# Patient Record
Sex: Male | Born: 1935 | Race: Black or African American | Hispanic: No | Marital: Married | Smoking: Former smoker
Health system: Southern US, Community
[De-identification: ages and names within clinical notes are randomized; demographics above are authoritative.]

## PROBLEM LIST (undated history)

## (undated) DIAGNOSIS — E785 Hyperlipidemia, unspecified: Secondary | ICD-10-CM

## (undated) DIAGNOSIS — F0280 Dementia in other diseases classified elsewhere without behavioral disturbance: Secondary | ICD-10-CM

## (undated) DIAGNOSIS — D759 Disease of blood and blood-forming organs, unspecified: Secondary | ICD-10-CM

## (undated) DIAGNOSIS — G309 Alzheimer's disease, unspecified: Secondary | ICD-10-CM

## (undated) DIAGNOSIS — K5792 Diverticulitis of intestine, part unspecified, without perforation or abscess without bleeding: Secondary | ICD-10-CM

## (undated) DIAGNOSIS — E039 Hypothyroidism, unspecified: Secondary | ICD-10-CM

## (undated) DIAGNOSIS — N529 Male erectile dysfunction, unspecified: Secondary | ICD-10-CM

## (undated) DIAGNOSIS — I639 Cerebral infarction, unspecified: Secondary | ICD-10-CM

## (undated) DIAGNOSIS — N400 Enlarged prostate without lower urinary tract symptoms: Secondary | ICD-10-CM

## (undated) DIAGNOSIS — M1990 Unspecified osteoarthritis, unspecified site: Secondary | ICD-10-CM

## (undated) DIAGNOSIS — I10 Essential (primary) hypertension: Secondary | ICD-10-CM

## (undated) DIAGNOSIS — E038 Other specified hypothyroidism: Secondary | ICD-10-CM

## (undated) HISTORY — DX: Hypothyroidism, unspecified: E03.9

## (undated) HISTORY — DX: Benign prostatic hyperplasia without lower urinary tract symptoms: N40.0

## (undated) HISTORY — DX: Hyperlipidemia, unspecified: E78.5

## (undated) HISTORY — DX: Diverticulitis of intestine, part unspecified, without perforation or abscess without bleeding: K57.92

## (undated) HISTORY — PX: INGUINAL HERNIA REPAIR: SUR1180

## (undated) HISTORY — PX: OTHER SURGICAL HISTORY: SHX169

## (undated) HISTORY — DX: Essential (primary) hypertension: I10

## (undated) HISTORY — DX: Other specified hypothyroidism: E03.8

## (undated) HISTORY — PX: COLECTOMY: SHX59

## (undated) HISTORY — DX: Male erectile dysfunction, unspecified: N52.9

---

## 1998-05-14 ENCOUNTER — Ambulatory Visit (HOSPITAL_COMMUNITY): Admission: RE | Admit: 1998-05-14 | Discharge: 1998-05-14 | Payer: Self-pay | Admitting: *Deleted

## 2002-05-16 ENCOUNTER — Encounter: Payer: Self-pay | Admitting: Family Medicine

## 2002-05-16 ENCOUNTER — Encounter: Admission: RE | Admit: 2002-05-16 | Discharge: 2002-05-16 | Payer: Self-pay | Admitting: Family Medicine

## 2002-05-20 ENCOUNTER — Encounter (HOSPITAL_BASED_OUTPATIENT_CLINIC_OR_DEPARTMENT_OTHER): Payer: Self-pay | Admitting: General Surgery

## 2002-05-24 ENCOUNTER — Inpatient Hospital Stay (HOSPITAL_COMMUNITY): Admission: RE | Admit: 2002-05-24 | Discharge: 2002-06-08 | Payer: Self-pay | Admitting: General Surgery

## 2002-05-29 ENCOUNTER — Encounter (HOSPITAL_BASED_OUTPATIENT_CLINIC_OR_DEPARTMENT_OTHER): Payer: Self-pay | Admitting: General Surgery

## 2002-05-31 ENCOUNTER — Encounter (HOSPITAL_BASED_OUTPATIENT_CLINIC_OR_DEPARTMENT_OTHER): Payer: Self-pay | Admitting: General Surgery

## 2003-07-26 ENCOUNTER — Ambulatory Visit (HOSPITAL_COMMUNITY): Admission: RE | Admit: 2003-07-26 | Discharge: 2003-07-26 | Payer: Self-pay | Admitting: *Deleted

## 2006-06-04 ENCOUNTER — Ambulatory Visit: Payer: Self-pay | Admitting: Family Medicine

## 2006-06-04 LAB — CONVERTED CEMR LAB
ALT: 21 units/L (ref 0–40)
AST: 22 units/L (ref 0–37)
BUN: 15 mg/dL (ref 6–23)
CO2: 28 meq/L (ref 19–32)
Calcium: 9.5 mg/dL (ref 8.4–10.5)
Chloride: 95 meq/L — ABNORMAL LOW (ref 96–112)
Chol/HDL Ratio, serum: 3.1
Cholesterol: 120 mg/dL (ref 0–200)
Creatinine, Ser: 1.3 mg/dL (ref 0.4–1.5)
Creatinine,U: 52.7 mg/dL
GFR calc non Af Amer: 58 mL/min
Glomerular Filtration Rate, Af Am: 70 mL/min/{1.73_m2}
Glucose, Bld: 131 mg/dL — ABNORMAL HIGH (ref 70–99)
HDL: 38.2 mg/dL — ABNORMAL LOW (ref >39.0)
Hgb A1c MFr Bld: 7.1 % — ABNORMAL HIGH (ref 4.6–6.0)
LDL Cholesterol: 70 mg/dL (ref 0–99)
Microalb Creat Ratio: 15.2 mg/g (ref 0.0–30.0)
Microalb, Ur: 0.8 mg/dL (ref 0.0–1.9)
Potassium: 4.4 meq/L (ref 3.5–5.1)
Sodium: 133 meq/L — ABNORMAL LOW (ref 135–145)
Triglyceride fasting, serum: 58 mg/dL (ref 0–149)
VLDL: 12 mg/dL (ref 0–40)

## 2006-09-08 ENCOUNTER — Ambulatory Visit: Payer: Self-pay | Admitting: Family Medicine

## 2006-10-27 ENCOUNTER — Ambulatory Visit: Payer: Self-pay | Admitting: Family Medicine

## 2006-10-27 LAB — CONVERTED CEMR LAB
BUN: 16 mg/dL (ref 6–23)
CO2: 31 meq/L (ref 19–32)
Calcium: 9.1 mg/dL (ref 8.4–10.5)
Creatinine, Ser: 1.2 mg/dL (ref 0.4–1.5)
Glucose, Bld: 130 mg/dL — ABNORMAL HIGH (ref 70–99)

## 2006-11-27 ENCOUNTER — Ambulatory Visit: Payer: Self-pay | Admitting: Family Medicine

## 2006-11-27 LAB — CONVERTED CEMR LAB
CO2: 31 meq/L (ref 19–32)
Calcium: 9.3 mg/dL (ref 8.4–10.5)
Chloride: 105 meq/L (ref 96–112)
Creatinine, Ser: 1.1 mg/dL (ref 0.4–1.5)
Glucose, Bld: 85 mg/dL (ref 70–99)
Sodium: 142 meq/L (ref 135–145)

## 2006-12-15 DIAGNOSIS — Z8719 Personal history of other diseases of the digestive system: Secondary | ICD-10-CM

## 2006-12-15 DIAGNOSIS — I10 Essential (primary) hypertension: Secondary | ICD-10-CM | POA: Insufficient documentation

## 2006-12-15 DIAGNOSIS — E785 Hyperlipidemia, unspecified: Secondary | ICD-10-CM

## 2007-01-25 ENCOUNTER — Ambulatory Visit: Payer: Self-pay | Admitting: Family Medicine

## 2007-01-25 DIAGNOSIS — F528 Other sexual dysfunction not due to a substance or known physiological condition: Secondary | ICD-10-CM | POA: Insufficient documentation

## 2007-01-28 ENCOUNTER — Ambulatory Visit: Payer: Self-pay | Admitting: Family Medicine

## 2007-01-28 LAB — CONVERTED CEMR LAB
AST: 19 units/L (ref 0–37)
BUN: 13 mg/dL (ref 6–23)
CO2: 29 meq/L (ref 19–32)
Calcium: 9 mg/dL (ref 8.4–10.5)
Creatinine,U: 77.3 mg/dL
GFR calc Af Amer: 94 mL/min
GFR calc non Af Amer: 78 mL/min
LDL Cholesterol: 64 mg/dL (ref 0–99)
Microalb, Ur: 1.1 mg/dL (ref 0.0–1.9)
Potassium: 4.5 meq/L (ref 3.5–5.1)
Total CHOL/HDL Ratio: 3.4
Triglycerides: 55 mg/dL (ref 0–149)
VLDL: 11 mg/dL (ref 0–40)

## 2007-01-29 ENCOUNTER — Telehealth (INDEPENDENT_AMBULATORY_CARE_PROVIDER_SITE_OTHER): Payer: Self-pay | Admitting: *Deleted

## 2007-03-12 ENCOUNTER — Ambulatory Visit: Payer: Self-pay | Admitting: Family Medicine

## 2007-04-09 ENCOUNTER — Telehealth (INDEPENDENT_AMBULATORY_CARE_PROVIDER_SITE_OTHER): Payer: Self-pay | Admitting: Family Medicine

## 2007-04-10 ENCOUNTER — Ambulatory Visit: Payer: Self-pay | Admitting: Family Medicine

## 2007-04-10 ENCOUNTER — Encounter (INDEPENDENT_AMBULATORY_CARE_PROVIDER_SITE_OTHER): Payer: Self-pay | Admitting: Family Medicine

## 2007-04-15 ENCOUNTER — Telehealth (INDEPENDENT_AMBULATORY_CARE_PROVIDER_SITE_OTHER): Payer: Self-pay | Admitting: *Deleted

## 2007-04-19 ENCOUNTER — Encounter (INDEPENDENT_AMBULATORY_CARE_PROVIDER_SITE_OTHER): Payer: Self-pay | Admitting: Family Medicine

## 2007-04-23 ENCOUNTER — Ambulatory Visit: Payer: Self-pay | Admitting: Family Medicine

## 2007-04-23 ENCOUNTER — Telehealth (INDEPENDENT_AMBULATORY_CARE_PROVIDER_SITE_OTHER): Payer: Self-pay | Admitting: *Deleted

## 2007-07-22 ENCOUNTER — Ambulatory Visit: Payer: Self-pay | Admitting: Family Medicine

## 2007-07-22 LAB — CONVERTED CEMR LAB
Chloride: 103 meq/L (ref 96–112)
Cholesterol: 126 mg/dL (ref 0–200)
Creatinine, Ser: 1.2 mg/dL (ref 0.4–1.5)
GFR calc non Af Amer: 63 mL/min
Glucose, Bld: 125 mg/dL — ABNORMAL HIGH (ref 70–99)
HDL: 40.7 mg/dL (ref >39.0)
LDL Cholesterol: 78 mg/dL (ref 0–99)
PSA: 1.36 ng/mL (ref 0.10–4.00)
Potassium: 5.3 meq/L — ABNORMAL HIGH (ref 3.5–5.1)
Sodium: 141 meq/L (ref 135–145)
Triglycerides: 39 mg/dL (ref 0–149)

## 2007-07-23 ENCOUNTER — Telehealth (INDEPENDENT_AMBULATORY_CARE_PROVIDER_SITE_OTHER): Payer: Self-pay | Admitting: *Deleted

## 2007-07-28 ENCOUNTER — Telehealth (INDEPENDENT_AMBULATORY_CARE_PROVIDER_SITE_OTHER): Payer: Self-pay | Admitting: Family Medicine

## 2007-07-30 ENCOUNTER — Ambulatory Visit: Payer: Self-pay | Admitting: Family Medicine

## 2007-07-30 LAB — CONVERTED CEMR LAB: Potassium: 4.8 meq/L (ref 3.5–5.1)

## 2007-08-02 ENCOUNTER — Encounter (INDEPENDENT_AMBULATORY_CARE_PROVIDER_SITE_OTHER): Payer: Self-pay | Admitting: *Deleted

## 2007-11-18 ENCOUNTER — Ambulatory Visit: Payer: Self-pay | Admitting: Internal Medicine

## 2007-11-25 ENCOUNTER — Encounter (INDEPENDENT_AMBULATORY_CARE_PROVIDER_SITE_OTHER): Payer: Self-pay | Admitting: *Deleted

## 2007-12-10 ENCOUNTER — Encounter: Payer: Self-pay | Admitting: Internal Medicine

## 2008-01-31 ENCOUNTER — Telehealth (INDEPENDENT_AMBULATORY_CARE_PROVIDER_SITE_OTHER): Payer: Self-pay | Admitting: *Deleted

## 2008-04-27 ENCOUNTER — Ambulatory Visit: Payer: Self-pay | Admitting: Internal Medicine

## 2008-05-02 ENCOUNTER — Encounter (INDEPENDENT_AMBULATORY_CARE_PROVIDER_SITE_OTHER): Payer: Self-pay | Admitting: *Deleted

## 2008-05-03 ENCOUNTER — Encounter (INDEPENDENT_AMBULATORY_CARE_PROVIDER_SITE_OTHER): Payer: Self-pay | Admitting: *Deleted

## 2008-05-03 LAB — CONVERTED CEMR LAB
ALT: 17 units/L (ref 0–53)
AST: 21 units/L (ref 0–37)
BUN: 22 mg/dL (ref 6–23)
Basophils Relative: 0.1 % (ref 0.0–3.0)
CO2: 31 meq/L (ref 19–32)
Calcium: 9.5 mg/dL (ref 8.4–10.5)
Creatinine, Ser: 1.4 mg/dL (ref 0.4–1.5)
Glucose, Bld: 126 mg/dL — ABNORMAL HIGH (ref 70–99)
Hemoglobin: 15 g/dL (ref 13.0–17.0)
Lymphocytes Relative: 37.7 % (ref 12.0–46.0)
Monocytes Relative: 8.1 % (ref 3.0–12.0)
Neutro Abs: 3 10*3/uL (ref 1.4–7.7)
RBC: 4.78 M/uL (ref 4.22–5.81)

## 2008-05-11 ENCOUNTER — Ambulatory Visit: Payer: Self-pay | Admitting: Internal Medicine

## 2008-05-11 LAB — CONVERTED CEMR LAB
Bilirubin Urine: NEGATIVE
Glucose, Urine, Semiquant: NEGATIVE
Ketones, urine, test strip: NEGATIVE
Nitrite: NEGATIVE
pH: 5

## 2008-05-12 ENCOUNTER — Telehealth: Payer: Self-pay | Admitting: Internal Medicine

## 2008-05-12 ENCOUNTER — Encounter: Payer: Self-pay | Admitting: Internal Medicine

## 2008-05-22 ENCOUNTER — Telehealth (INDEPENDENT_AMBULATORY_CARE_PROVIDER_SITE_OTHER): Payer: Self-pay | Admitting: *Deleted

## 2008-06-19 ENCOUNTER — Telehealth (INDEPENDENT_AMBULATORY_CARE_PROVIDER_SITE_OTHER): Payer: Self-pay | Admitting: *Deleted

## 2008-07-18 ENCOUNTER — Telehealth (INDEPENDENT_AMBULATORY_CARE_PROVIDER_SITE_OTHER): Payer: Self-pay | Admitting: *Deleted

## 2008-08-30 ENCOUNTER — Ambulatory Visit: Payer: Self-pay | Admitting: Internal Medicine

## 2008-08-30 DIAGNOSIS — H409 Unspecified glaucoma: Secondary | ICD-10-CM | POA: Insufficient documentation

## 2008-08-30 DIAGNOSIS — D696 Thrombocytopenia, unspecified: Secondary | ICD-10-CM

## 2008-08-31 ENCOUNTER — Telehealth (INDEPENDENT_AMBULATORY_CARE_PROVIDER_SITE_OTHER): Payer: Self-pay | Admitting: *Deleted

## 2008-09-05 LAB — CONVERTED CEMR LAB
Basophils Absolute: 0 10*3/uL (ref 0.0–0.1)
Basophils Relative: 0.2 % (ref 0.0–3.0)
Calcium: 9.5 mg/dL (ref 8.4–10.5)
Chloride: 103 meq/L (ref 96–112)
Cholesterol: 128 mg/dL (ref 0–200)
Creatinine, Ser: 1 mg/dL (ref 0.4–1.5)
Eosinophils Absolute: 0.2 10*3/uL (ref 0.0–0.7)
GFR calc Af Amer: 94 mL/min
GFR calc non Af Amer: 77 mL/min
HDL: 43.2 mg/dL (ref >39.0)
Hgb A1c MFr Bld: 6.6 % — ABNORMAL HIGH (ref 4.6–6.0)
MCHC: 34 g/dL (ref 30.0–36.0)
MCV: 91.6 fL (ref 78.0–100.0)
Neutrophils Relative %: 55.8 % (ref 43.0–77.0)
RBC: 4.82 M/uL (ref 4.22–5.81)
VLDL: 10 mg/dL (ref 0–40)

## 2008-11-06 ENCOUNTER — Encounter: Payer: Self-pay | Admitting: Internal Medicine

## 2008-11-14 ENCOUNTER — Encounter: Payer: Self-pay | Admitting: Internal Medicine

## 2008-11-14 LAB — HM COLONOSCOPY

## 2008-12-12 ENCOUNTER — Encounter: Payer: Self-pay | Admitting: Internal Medicine

## 2009-01-15 ENCOUNTER — Ambulatory Visit: Payer: Self-pay | Admitting: Internal Medicine

## 2009-05-21 ENCOUNTER — Ambulatory Visit: Payer: Self-pay | Admitting: Internal Medicine

## 2009-05-24 ENCOUNTER — Telehealth (INDEPENDENT_AMBULATORY_CARE_PROVIDER_SITE_OTHER): Payer: Self-pay | Admitting: *Deleted

## 2009-05-25 ENCOUNTER — Ambulatory Visit: Payer: Self-pay | Admitting: Diagnostic Radiology

## 2009-05-25 ENCOUNTER — Ambulatory Visit (HOSPITAL_BASED_OUTPATIENT_CLINIC_OR_DEPARTMENT_OTHER): Admission: RE | Admit: 2009-05-25 | Discharge: 2009-05-25 | Payer: Self-pay | Admitting: Internal Medicine

## 2009-05-25 ENCOUNTER — Ambulatory Visit: Payer: Self-pay | Admitting: Family

## 2009-05-25 DIAGNOSIS — E119 Type 2 diabetes mellitus without complications: Secondary | ICD-10-CM

## 2009-05-28 ENCOUNTER — Encounter: Payer: Self-pay | Admitting: Family

## 2009-06-08 ENCOUNTER — Ambulatory Visit: Payer: Self-pay | Admitting: Internal Medicine

## 2009-06-14 ENCOUNTER — Encounter (INDEPENDENT_AMBULATORY_CARE_PROVIDER_SITE_OTHER): Payer: Self-pay | Admitting: *Deleted

## 2009-06-14 LAB — CONVERTED CEMR LAB
BUN: 15 mg/dL (ref 6–23)
CO2: 31 meq/L (ref 19–32)
Chloride: 101 meq/L (ref 96–112)
Creatinine, Ser: 1.3 mg/dL (ref 0.4–1.5)
Glucose, Bld: 91 mg/dL (ref 70–99)
Microalb Creat Ratio: 13.6 mg/g (ref 0.0–30.0)

## 2009-07-20 ENCOUNTER — Encounter: Payer: Self-pay | Admitting: Internal Medicine

## 2009-08-21 ENCOUNTER — Ambulatory Visit: Payer: Self-pay | Admitting: Internal Medicine

## 2009-08-21 DIAGNOSIS — M549 Dorsalgia, unspecified: Secondary | ICD-10-CM | POA: Insufficient documentation

## 2009-08-21 LAB — CONVERTED CEMR LAB: Blood Glucose, Fingerstick: 179

## 2009-08-30 ENCOUNTER — Telehealth: Payer: Self-pay | Admitting: Internal Medicine

## 2009-09-03 ENCOUNTER — Encounter: Payer: Self-pay | Admitting: Internal Medicine

## 2009-09-04 ENCOUNTER — Encounter: Admission: RE | Admit: 2009-09-04 | Discharge: 2009-09-04 | Payer: Self-pay | Admitting: Internal Medicine

## 2009-11-16 ENCOUNTER — Ambulatory Visit: Payer: Self-pay | Admitting: Internal Medicine

## 2009-11-22 ENCOUNTER — Ambulatory Visit: Payer: Self-pay | Admitting: Internal Medicine

## 2009-11-25 LAB — CONVERTED CEMR LAB
AST: 22 units/L (ref 0–37)
Basophils Relative: 0.5 % (ref 0.0–3.0)
Cholesterol: 126 mg/dL (ref 0–200)
Eosinophils Relative: 4.2 % (ref 0.0–5.0)
HCT: 43 % (ref 39.0–52.0)
Lymphs Abs: 2.1 10*3/uL (ref 0.7–4.0)
MCV: 92.3 fL (ref 78.0–100.0)
Monocytes Absolute: 0.5 10*3/uL (ref 0.1–1.0)
Monocytes Relative: 9.9 % (ref 3.0–12.0)
Neutrophils Relative %: 47.6 % (ref 43.0–77.0)
RBC: 4.65 M/uL (ref 4.22–5.81)
Triglycerides: 27 mg/dL (ref 0.0–149.0)
WBC: 5.4 10*3/uL (ref 4.5–10.5)

## 2010-03-15 ENCOUNTER — Telehealth: Payer: Self-pay | Admitting: Internal Medicine

## 2010-05-09 ENCOUNTER — Ambulatory Visit: Payer: Self-pay | Admitting: Internal Medicine

## 2010-05-14 ENCOUNTER — Ambulatory Visit: Payer: Self-pay | Admitting: Internal Medicine

## 2010-05-16 LAB — CONVERTED CEMR LAB
BUN: 19 mg/dL (ref 6–23)
CO2: 31 meq/L (ref 19–32)
GFR calc non Af Amer: 94.12 mL/min (ref >60)
Glucose, Bld: 135 mg/dL — ABNORMAL HIGH (ref 70–99)
Potassium: 5.4 meq/L — ABNORMAL HIGH (ref 3.5–5.1)

## 2010-05-29 ENCOUNTER — Ambulatory Visit: Payer: Self-pay | Admitting: Internal Medicine

## 2010-06-20 ENCOUNTER — Encounter: Payer: Self-pay | Admitting: Internal Medicine

## 2010-08-06 NOTE — Assessment & Plan Note (Signed)
Summary: FLU SHOT/KN   Nurse Visit   Allergies: No Known Drug Allergies  Orders Added: 1)  Flu Vaccine 3yrs + MEDICARE PATIENTS [Q2039] 2)  Administration Flu vaccine - MCR [G0008] Flu Vaccine Consent Questions     Do you have a history of severe allergic reactions to this vaccine? no    Any prior history of allergic reactions to egg and/or gelatin? no    Do you have a sensitivity to the preservative Thimersol? no    Do you have a past history of Guillan-Barre Syndrome? no    Do you currently have an acute febrile illness? no    Have you ever had a severe reaction to latex? no    Vaccine information given and explained to patient? yes    Are you currently pregnant? no    Lot Number:[id number]   Exp Date:01/04/2011   Site Given  Left Deltoid IM 

## 2010-08-06 NOTE — Miscellaneous (Signed)
Summary: Orders Update  Clinical Lists Changes  Orders: Added new Referral order of Orthopedic Referral (Ortho) - Signed 

## 2010-08-06 NOTE — Letter (Signed)
Summary: CMN for Therapeutic Shoes/[facility] Diabetic Supplies  CMN for Therapeutic Shoes/[facility] Diabetic Supplies   Imported By: Lanelle Bal 07/25/2009 13:01:02  _____________________________________________________________________  External Attachment:    Type:   Image     Comment:   External Document

## 2010-08-06 NOTE — Letter (Signed)
Summary: CMN for Diabetes Supplies/[facility] Diabetic Supplies  CMN for Diabetes Supplies/[facility] Diabetic Supplies   Imported By: Lanelle Bal 07/25/2009 13:00:04  _____________________________________________________________________  External Attachment:    Type:   Image     Comment:   External Document

## 2010-08-06 NOTE — Progress Notes (Signed)
Summary: VERAPAMIL REFILL  Phone Note Refill Request Message from:  Fax from Pharmacy on March 15, 2010 4:45 PM  Refills Requested: Medication #1:  VERAPAMIL HCL CR 240 MG TBCR 1 by mouth two times a day [facility], [address]--HAND WRITTEN NOTE ON FAX STATES--" NO REFILLS (HERE) ON FILE--LAST RX 11/2009 WITH 3 REFILLS--NEED NEW RX. Christian Erickson - [phone]  Initial call taken by: Jerolyn Shin,  March 15, 2010 4:47 PM  Follow-up for Phone Call        I see that this was sent, but will give one refill, patient coming due for office visit anyway. Follow-up by: Lucious Groves CMA,  March 15, 2010 5:23 PM    Prescriptions: VERAPAMIL HCL CR 240 MG TBCR (VERAPAMIL HCL) 1 by mouth two times a day  #60 Tablet x 0   Entered by:   Lucious Groves CMA   Authorized by:   Nolon Rod. Matthew Pais MD   Signed by:   Lucious Groves CMA on 03/15/2010   Method used:   Electronically to        [facility] [phone]* (retail)       [address]       [place], [place]  [address]       Ph: [phone]       Fax: [phone]   RxID:   [id number]

## 2010-08-06 NOTE — Assessment & Plan Note (Signed)
Summary: rto 4-5 months/cbs   Vital Signs:  Patient profile:   77 year old male Height:      66.5 inches Weight:      168.38 pounds BMI:     26.87 Pulse rate:   78 / minute Pulse rhythm:   regular BP sitting:   130 / 80  (left arm) Cuff size:   regular  Vitals Entered By: Army Fossa CMA (May 14, 2010 11:26 AM) CC: 4-5 month f/u- not fasting Comments [facility] [address]    History of Present Illness: ROV   Diabetes-- ambulatory CBGs in the 90s, occasionally in the 68s (w/o hypoglycemic symptoms) Hypertension-- normal ambulatory BPs     ROS + stress d/t family issues  (-)CP (-)SOB had back pain, resolved    Current Medications (verified): 1)  Enalapril Maleate 20 Mg  Tabs (Enalapril Maleate) .... Take One Tablet Twice Daily. 2)  Verapamil Hcl Cr 240 Mg Tbcr (Verapamil Hcl) .[name].. 1 By Mouth Two Times A Day 3)  Glucophage 1000 Mg  Tabs (Metformin Hcl) .[name].. 1 By Mouth Bid 4)  Lipitor 20 Mg Tabs (Atorvastatin Calcium) .... Once Daily 5)  Levitra 10 Mg Tabs (Vardenafil Hcl) .... Use As Directed As Needed 6)  Aspirin 81 Mg Tabs (Aspirin) 7)  Multivitamin 8)  Antioxident 9)  Lumigan 0.03 % Soln (Bimatoprost)  Allergies (verified): No Known Drug Allergies  Past History:  Past Medical History: Reviewed history from 06/08/2009 and no changes required. BPH Diabetes Hypertension hyperlipidemia Diverticulitis, hx of ED Glaucoma , sees eye doctor routinely   Past Surgical History: Reviewed history from 08/30/2008 and no changes required. Inguinal herniorrhaphy Colectomy with reversal due to diverticulitis per patient (in 1990s aprox) SBO repair  Social History: Reviewed history from 06/08/2009 and no changes required. Retired Married wife had a CABG 2011 G-daughter dx w/ DM (45 months old) diet-- balance, low fat exercises x4/week, statonary bike, yard work  Physical Exam  General:  alert and well-developed.   Lungs:  Normal respiratory effort,  chest expands symmetrically. Lungs are clear to auscultation, no crackles or wheezes. Heart:  Normal rate and regular rhythm. S1 and S2 normal without gallop, murmur, click, rub or other extra sounds. Extremities:  no lower extremity edema     Impression & Recommendations:  Problem # 1:  HYPERTENSION (ICD-401.9) at goal  His updated medication list for this problem includes:    Enalapril Maleate 20 Mg Tabs (Enalapril maleate) .[name]... Take one tablet twice daily.    Verapamil Hcl Cr 240 Mg Tbcr (Verapamil hcl) .[name]... 1 by mouth two times a day  Orders: Venipuncture (57846) TLB-BMP (Basic Metabolic Panel-BMET) (80048-METABOL)  BP today: 130/80 Prior BP: 132/80 (11/16/2009)  Prior 10 Yr Risk Heart Disease: 27 % (11/18/2007)  Labs Reviewed: K+: 4.6 (06/08/2009) Creat: : 1.3 (06/08/2009)   Chol: 126 (11/22/2009)   HDL: 48.40 (11/22/2009)   LDL: 72 (11/22/2009)   TG: 27.0 (11/22/2009)  Problem # 2:  DM (ICD-250.00) labs His updated medication list for this problem includes:    Enalapril Maleate 20 Mg Tabs (Enalapril maleate) .[name]... Take one tablet twice daily.    Glucophage 1000 Mg Tabs (Metformin hcl) .[name]... 1 by mouth bid    Aspirin 81 Mg Tabs (Aspirin)  Orders: TLB-A1C / Hgb A1C (Glycohemoglobin) (83036-A1C)  Complete Medication List: 1)  Enalapril Maleate 20 Mg Tabs (Enalapril maleate) .... Take one tablet twice daily. 2)  Verapamil Hcl Cr 240 Mg Tbcr (Verapamil hcl) .[name].. 1 by mouth two  times a day 3)  Glucophage 1000 Mg Tabs (Metformin hcl) .[name].. 1 by mouth bid 4)  Lipitor 20 Mg Tabs (Atorvastatin calcium) .... Once daily 5)  Levitra 10 Mg Tabs (Vardenafil hcl) .... Use as directed as needed 6)  Aspirin 81 Mg Tabs (Aspirin) 7)  Multivitamin  8)  Antioxident  9)  Lumigan 0.03 % Soln (Bimatoprost)  Patient Instructions: 1)  Please schedule a follow-up appointment in 4 months, fasting , physical exam    Orders Added: 1)  Venipuncture [36415] 2)  TLB-BMP (Basic Metabolic  Panel-BMET) [80048-METABOL] 3)  TLB-A1C / Hgb A1C (Glycohemoglobin) [83036-A1C] 4)  Est. Patient Level III [10932]

## 2010-08-06 NOTE — Progress Notes (Signed)
Summary: PAIN IN BACK NO BETTER--CALLED --WHAT IS NEXT STEP?  Phone Note Call from Patient Call back at Home Phone [phone]   Caller: Patient Call For: [place] E. Delmar Dondero MD Reason for Call: Talk to Nurse Summary of Call: PATIENT CALLING, STATES THE PAIN IN HIS LOWER BACK IS NO BETTER, AND HAS MOVED DOWN LEFT LEG.  PATIENT STATES IF HE HAD NO IMPROVEMENT HE WAS TO LET DR. Teneisha Gignac KNOW.   Initial call taken by: Magdalen Spatz [facility],  August 30, 2009 3:03 PM  Follow-up for Phone Call        spoke with pt who say pain has moved down to the thigh area, subsides for a while then comes back. pt says has 3 vicodin left. Pt says pain is not any better.  Pt aware Dr Drue Novel out of office and will address in AM  Kandice Hams  August 30, 2009 3:12 PM   Follow-up by: Kandice Hams,  August 30, 2009 3:08 PM  Additional Follow-up for Phone Call Additional follow up Details #1::        NOW ONLY HAS TWO VICODIN LEFT---WOULD LIKE DR Jonell Krontz TO CALL HIM BACK OR ADVISE HIM WHAT IS HIS NEXT STEP----MRI OR ANOTHER VISIT OR SPECIALIST??? Additional Follow-up by: Jerolyn Shin,  August 31, 2009 10:40 AM    Additional Follow-up for Phone Call Additional follow up Details #2::    call #[phone] more vicodin schedule a back MRI refer to ortho Erasmus Bistline E. Delayza Lungren MD  August 31, 2009 2:03 PM   PT INFORMED RX FAXED TO [facility] --WILL BE CONTACTED BY REFERRAL COORDINATOR IN REF TO MRI OF BACK AND ORTHO   --PT SAYS ANY DAY WILL BE FINE WOULD LIKE AS SOON AS POSSIBLE Follow-up by: Kandice Hams,  August 31, 2009 3:02 PM  Additional Follow-up for Phone Call Additional follow up Details #3:: Details for Additional Follow-up Action Taken: PT IS SCHEDULED FOR HIS MRI ON 09-03-2009, AND HAS BEEN REFERRED TO ORTHOPAEDIC, PT IS AWARE Magdalen Spatz [facility]  September 03, 2009 2:51 PM   Prescriptions: VICODIN 5-500 MG TABS (HYDROCODONE-ACETAMINOPHEN) one or two by mouth every 6 hours as needed for pain  #60 x 0   Entered by:   Kandice Hams  Authorized by:   Nolon Rod. Tracyann Duffell MD   Signed by:   Kandice Hams on 08/31/2009   Method used:   Printed then faxed to ...       [facility]  [address] [phone]* (retail)       [address]       [place], [place]  [address]       Ph: [phone]       Fax: [phone]   RxID:   [id number]

## 2010-08-06 NOTE — Assessment & Plan Note (Signed)
Summary: pain in left leg/kdc   Vital Signs:  Patient profile:   77 year old male Height:      66.5 inches Weight:      162 pounds Pulse rate:   64 / minute BP sitting:   162 / 80  Vitals Entered By: rachel peeler CC: left leg pain CBG Result 179 Comments left leg, hip & lower back pain x couple days, trouble walking, "can't function", polyuria (4 x daily) from laying down all day, sharp shooting pain from back-down leg, no appetite, has not taken any meds today   History of Present Illness: two days ago, developed a left inner thigh anesthesia yesterday she developed acutely as severe pain in the left hip, left back area pain is radiated into the left leg, above  the knee pain is worse after walking and described as sharp  also states that has polyuria (4 x daily) 'from laying down all day"  no appetite, has not taken any meds today  Allergies: No Known Drug Allergies  Past History:  Past Medical History: Reviewed history from 06/08/2009 and no changes required. BPH Diabetes Hypertension hyperlipidemia Diverticulitis, hx of ED Glaucoma , sees eye doctor routinely   Past Surgical History: Reviewed history from 08/30/2008 and no changes required. Inguinal herniorrhaphy Colectomy with reversal due to diverticulitis per patient (in 1990s aprox) SBO repair  Social History: Reviewed history from 06/08/2009 and no changes required. Retired Married diet-- balance, low fat exercises x4/week, statonary bike, yard work  Review of Systems       denies any rash no fever no bladder or bowel incontinence no injury or recent fall  Physical Exam  General:  alert, well-developed, and well-nourished.  alert, well-developed, and well-nourished.   Abdomen:  soft and non-tender.  soft and non-tender.   Extremities:  no edema Neurologic:  alert & oriented X3.   lower extremity reflexes and strands symmetric gait and posture is antalgic particularly when he lays down in  the table to be examined straight leg is negative Skin:  back, abdomen, legs and buttocks are carefully inspected, no rash Psych:  not anxious appearing and not depressed appearing.     Impression & Recommendations:  Problem # 1:  BACK PAIN (ICD-724.5) Assessment New acute onset of back pain with radicular features conservative treatment for few  days but will quickly request for MRI if he is not better His updated medication list for this problem includes:    Aspirin 81 Mg Tabs (Aspirin)    Vicodin 5-500 Mg Tabs (Hydrocodone-acetaminophen) ..... One or two by mouth every 6 hours as needed for pain  Problem # 2:  DM (ICD-250.00) complain  of polyuria, CBG today 179 recommend to go back to his medicines His updated medication list for this problem includes:    Enalapril Maleate 20 Mg Tabs (Enalapril maleate) .[name]... Take one tablet twice daily.    Glucophage 1000 Mg Tabs (Metformin hcl) .[name]... 1 by mouth bid    Aspirin 81 Mg Tabs (Aspirin)  Complete Medication List: 1)  Enalapril Maleate 20 Mg Tabs (Enalapril maleate) .... Take one tablet twice daily. 2)  Verapamil Hcl Cr 240 Mg Tbcr (Verapamil hcl) .[name].. 1 by mouth two times a day 3)  Glucophage 1000 Mg Tabs (Metformin hcl) .[name].. 1 by mouth bid 4)  Lipitor 20 Mg Tabs (Atorvastatin calcium) .... Once daily 5)  Levitra 10 Mg Tabs (Vardenafil hcl) .... Use as directed as needed 6)  Aspirin 81 Mg Tabs (Aspirin) 7)  Multivitamin  8)  Antioxident  9)  Lumigan 0.03 % Soln (Bimatoprost) 10)  Vicodin 5-500 Mg Tabs (Hydrocodone-acetaminophen) .... One or two by mouth every 6 hours as needed for pain  Other Orders: Capillary Blood Glucose/CBG (91478) Capillary Blood Glucose/CBG (29562)  Patient Instructions: 1)  rest  2)  warm compress 3)  Vicodin as needed for pain 4)  call in 3 to 4 days if  no better for a MRI 5)  call anytime if the pain gets worse or if is not well controlled Prescriptions: VICODIN 5-500 MG TABS  (HYDROCODONE-ACETAMINOPHEN) one or two by mouth every 6 hours as needed for pain  #40 x 0   Entered and Authorized by:   Elita Quick E. Jacquelina Hewins MD   Signed by:   Nolon Rod. Meet Weathington MD on 08/21/2009   Method used:   Print then Give to Patient   RxID:   [id number]

## 2010-08-06 NOTE — Assessment & Plan Note (Signed)
Summary: 4 mo. f/u - jr   Vital Signs:  Patient profile:   77 year old male Height:      66.5 inches Weight:      161.4 pounds Pulse rate:   74 / minute BP sitting:   132 / 80  Vitals Entered By: Shary Decamp (Nov 16, 2009 2:06 PM) CC: rov   History of Present Illness: ROV BPH-- had frecuency x few days recently , symptoms resolved   Diabetes-- good medication compliance , ambulatory CBGs AM 109, 96,  PM 150  Hypertension-- good medication compliance, ambulatory BPs "ok"  hand pain x 2 months, R side, located at base of 3 finger, and some at the second and third knuckle. No history of injury or surgery in that area.  Occasionally has  a trigger phenomena    Current Medications (verified): 1)  Enalapril Maleate 20 Mg  Tabs (Enalapril Maleate) .... Take One Tablet Twice Daily. 2)  Verapamil Hcl Cr 240 Mg Tbcr (Verapamil Hcl) .[name].. 1 By Mouth Two Times A Day 3)  Glucophage 1000 Mg  Tabs (Metformin Hcl) .[name].. 1 By Mouth Bid 4)  Lipitor 20 Mg Tabs (Atorvastatin Calcium) .... Once Daily 5)  Levitra 10 Mg Tabs (Vardenafil Hcl) .... Use As Directed As Needed 6)  Aspirin 81 Mg Tabs (Aspirin) 7)  Multivitamin 8)  Antioxident 9)  Lumigan 0.03 % Soln (Bimatoprost)  Allergies (verified): No Known Drug Allergies  Past History:  Past Medical History: Reviewed history from 06/08/2009 and no changes required. BPH Diabetes Hypertension hyperlipidemia Diverticulitis, hx of ED Glaucoma , sees eye doctor routinely   Past Surgical History: Reviewed history from 08/30/2008 and no changes required. Inguinal herniorrhaphy Colectomy with reversal due to diverticulitis per patient (in 1990s aprox) SBO repair  Social History: Reviewed history from 06/08/2009 and no changes required. Retired Married diet-- balance, low fat exercises x4/week, statonary bike, yard work  Review of Systems CV:  Denies chest pain or discomfort and swelling of feet. Resp:  Denies cough and shortness of  breath. GI:  Denies bloody stools and diarrhea. MS:  no wrist puffines-swelling.  Physical Exam  General:  alert, well-developed, and well-nourished.   Lungs:  Normal respiratory effort, chest expands symmetrically. Lungs are clear to auscultation, no crackles or wheezes. Heart:  Normal rate and regular rhythm. S1 and S2 normal without gallop, murmur, click, rub or other extra sounds. Extremities:  no lower extremity edema Wrists: Normal Left hand: normal Right hand:mild swelling at the second and third knuckle, palpation of the palm close to the third finger show a tender area, tendon  seem to have a 1/2 cm knot   Impression & Recommendations:  Problem # 1:  TENDINITIS, RIGHT HAND (ICD-727.05) Assessment New symptoms consistent with an tendinitis likes to try a conservative treatment , if no better , consider ortho Rx meloxicam, gi s/e discussed   Problem # 2:  HYPERTENSION (ICD-401.9) at goal  His updated medication list for this problem includes:    Enalapril Maleate 20 Mg Tabs (Enalapril maleate) .[name]... Take one tablet twice daily.    Verapamil Hcl Cr 240 Mg Tbcr (Verapamil hcl) .[name]... 1 by mouth two times a day  BP today: 132/80 Prior BP: 162/80 (08/21/2009)  Prior 10 Yr Risk Heart Disease: 27 % (11/18/2007)  Labs Reviewed: K+: 4.6 (06/08/2009) Creat: : 1.3 (06/08/2009)   Chol: 128 (08/30/2008)   HDL: 43.2 (08/30/2008)   LDL: 75 (08/30/2008)   TG: 48 (08/30/2008)  Problem # 3:  HYPERLIPIDEMIA (  ICD-272.4) encourage diet and exercise due for labs His updated medication list for this problem includes:    Lipitor 20 Mg Tabs (Atorvastatin calcium) ..... Once daily  Labs Reviewed: SGOT: 22 (01/15/2009)   SGPT: 15 (01/15/2009)  Prior 10 Yr Risk Heart Disease: 27 % (11/18/2007)   HDL:43.2 (08/30/2008), 40.7 (07/22/2007)  LDL:75 (08/30/2008), 78 (07/22/2007)  Chol:128 (08/30/2008), 126 (07/22/2007)  Trig:48 (08/30/2008), 39 (07/22/2007)  Problem # 4:  BACK PAIN  (ICD-724.5) resolved   His updated medication list for this problem includes:    Aspirin 81 Mg Tabs (Aspirin)    Meloxicam 7.5 Mg Tabs (Meloxicam) .[name]... 1 or 2 a day as needed for hand pain  Problem # 5:  DM (ICD-250.00) due for labs His updated medication list for this problem includes:    Enalapril Maleate 20 Mg Tabs (Enalapril maleate) .[name]... Take one tablet twice daily.    Glucophage 1000 Mg Tabs (Metformin hcl) .[name]... 1 by mouth bid    Aspirin 81 Mg Tabs (Aspirin)  Problem # 6:  THROMBOCYTOPENIA (ICD-287.5) Will recheck a CBC  Complete Medication List: 1)  Enalapril Maleate 20 Mg Tabs (Enalapril maleate) .... Take one tablet twice daily. 2)  Verapamil Hcl Cr 240 Mg Tbcr (Verapamil hcl) .[name].. 1 by mouth two times a day 3)  Glucophage 1000 Mg Tabs (Metformin hcl) .[name].. 1 by mouth bid 4)  Lipitor 20 Mg Tabs (Atorvastatin calcium) .... Once daily 5)  Levitra 10 Mg Tabs (Vardenafil hcl) .... Use as directed as needed 6)  Aspirin 81 Mg Tabs (Aspirin) 7)  Multivitamin  8)  Antioxident  9)  Lumigan 0.03 % Soln (Bimatoprost) 10)  Meloxicam 7.5 Mg Tabs (Meloxicam) .[name].. 1 or 2 a day as needed for hand pain  Patient Instructions: 1)  please come back fasting 2)  Hemoglobin A1c--- dx diabetes 3)  FLP, AST, ALT---- dx  high cholesterol 4)  CBC----dx thrombocytopenia 5)  Please schedule a follow-up appointment in 4 to 5 months .  Prescriptions: LEVITRA 10 MG TABS (VARDENAFIL HCL) USE AS DIRECTED as needed  #10 x 3   Entered by:   Shary Decamp   Authorized by:   Nolon Rod. Paz MD   Signed by:   Shary Decamp on 11/16/2009   Method used:   Electronically to        [facility] [phone]* (retail)       [address]       [place], [place]  [address]       Ph: [phone]       Fax: [phone]   RxID:   [id number] LIPITOR 20 MG TABS (ATORVASTATIN CALCIUM) once daily  #30 Tablet x 6   Entered by:   Shary Decamp   Authorized by:   Nolon Rod. Paz MD   Signed by:   Shary Decamp on  11/16/2009   Method used:   Electronically to        [facility] [phone]* (retail)       [address]       [place], [place]  [address]       Ph: [phone]       Fax: [phone]   RxID:   [id number] GLUCOPHAGE 1000 MG  TABS (METFORMIN HCL) 1 by mouth bid  #60 Tablet x 6   Entered by:   Shary Decamp   Authorized by:   Nolon Rod. Paz MD   Signed by:   Shary Decamp on 11/16/2009   Method  used:   Electronically to        [facility] [phone]* (retail)       [address]       [place], [place]  [address]       Ph: [phone]       Fax: [phone]   RxID:   [id number] VERAPAMIL HCL CR 240 MG TBCR (VERAPAMIL HCL) 1 by mouth two times a day  #60 Tablet x 6   Entered by:   Shary Decamp   Authorized by:   Nolon Rod. Paz MD   Signed by:   Shary Decamp on 11/16/2009   Method used:   Electronically to        [facility] [phone]* (retail)       [address]       [place], [place]  [address]       Ph: [phone]       Fax: [phone]   RxID:   [id number] ENALAPRIL MALEATE 20 MG  TABS (ENALAPRIL MALEATE) Take one tablet twice daily.  #60 Tablet x 6   Entered by:   Shary Decamp   Authorized by:   Nolon Rod. Paz MD   Signed by:   Shary Decamp on 11/16/2009   Method used:   Electronically to        [facility] [phone]* (retail)       [address]       [place], [place]  [address]       Ph: [phone]       Fax: [phone]   RxID:   [id number] MELOXICAM 7.5 MG TABS (MELOXICAM) 1 or 2 a day as needed for hand pain  #30 x 0   Entered and Authorized by:   Nolon Rod. Paz MD   Signed by:   Nolon Rod. Paz MD on 11/16/2009   Method used:   Electronically to        [facility] [phone]* (retail)       [address]       [place], [place]  [address]       Ph: [phone]       Fax: [phone]   RxID:   [id number]

## 2010-08-08 NOTE — Letter (Signed)
Summary: CMN for Diabetes Supplies/[facility] Co  CMN for Diabetes Supplies/Med Co   Imported By: Lanelle Bal 06/28/2010 12:26:18  _____________________________________________________________________  External Attachment:    Type:   Image     Comment:   External Document

## 2010-09-10 ENCOUNTER — Other Ambulatory Visit: Payer: Self-pay | Admitting: Internal Medicine

## 2010-09-10 ENCOUNTER — Encounter (INDEPENDENT_AMBULATORY_CARE_PROVIDER_SITE_OTHER): Payer: Medicare Other | Admitting: Internal Medicine

## 2010-09-10 ENCOUNTER — Encounter: Payer: Self-pay | Admitting: Internal Medicine

## 2010-09-10 DIAGNOSIS — I10 Essential (primary) hypertension: Secondary | ICD-10-CM

## 2010-09-10 DIAGNOSIS — E119 Type 2 diabetes mellitus without complications: Secondary | ICD-10-CM

## 2010-09-10 DIAGNOSIS — E785 Hyperlipidemia, unspecified: Secondary | ICD-10-CM

## 2010-09-10 DIAGNOSIS — D696 Thrombocytopenia, unspecified: Secondary | ICD-10-CM

## 2010-09-10 DIAGNOSIS — Z0000 Encounter for general adult medical examination without abnormal findings: Secondary | ICD-10-CM

## 2010-09-10 DIAGNOSIS — J309 Allergic rhinitis, unspecified: Secondary | ICD-10-CM

## 2010-09-10 DIAGNOSIS — Z125 Encounter for screening for malignant neoplasm of prostate: Secondary | ICD-10-CM

## 2010-09-10 LAB — CBC WITH DIFFERENTIAL/PLATELET
Basophils Absolute: 0 10*3/uL (ref 0.0–0.1)
Eosinophils Absolute: 0.2 10*3/uL (ref 0.0–0.7)
HCT: 44.6 % (ref 39.0–52.0)
Hemoglobin: 15.1 g/dL (ref 13.0–17.0)
Lymphs Abs: 1.9 10*3/uL (ref 0.7–4.0)
MCHC: 33.8 g/dL (ref 30.0–36.0)
MCV: 91.4 fl (ref 78.0–100.0)
Monocytes Absolute: 0.4 10*3/uL (ref 0.1–1.0)
Neutro Abs: 2.6 10*3/uL (ref 1.4–7.7)
Platelets: 131 10*3/uL — ABNORMAL LOW (ref 150.0–400.0)
RDW: 14.3 % (ref 11.5–14.6)

## 2010-09-10 LAB — PSA: PSA: 2.1 ng/mL (ref 0.10–4.00)

## 2010-09-10 LAB — LIPID PANEL
LDL Cholesterol: 72 mg/dL (ref 0–99)
Total CHOL/HDL Ratio: 3

## 2010-09-10 LAB — BASIC METABOLIC PANEL
BUN: 20 mg/dL (ref 6–23)
CO2: 31 mEq/L (ref 19–32)
Chloride: 103 mEq/L (ref 96–112)
GFR: 83.27 mL/min (ref >60.00)
Glucose, Bld: 135 mg/dL — ABNORMAL HIGH (ref 70–99)
Potassium: 4.8 mEq/L (ref 3.5–5.1)
Sodium: 140 mEq/L (ref 135–145)

## 2010-09-10 LAB — ALT: ALT: 17 U/L (ref 0–53)

## 2010-09-11 ENCOUNTER — Encounter (INDEPENDENT_AMBULATORY_CARE_PROVIDER_SITE_OTHER): Payer: Self-pay | Admitting: *Deleted

## 2010-09-17 NOTE — Miscellaneous (Signed)
Summary: Immunization Entry   Immunization History:  Zostavax History:    Zostavax # 1:  zostavax (09/10/2010)

## 2010-09-17 NOTE — Assessment & Plan Note (Signed)
Summary: cpx/fasting/kn---[phone]--called and confirmed///sph   Vital Signs:  Patient profile:   77 year old male Height:      66.5 inches Weight:      169 pounds Pulse rate:   78 / minute Pulse rhythm:   regular BP sitting:   154 / 80  (left arm) Cuff size:   regular  Vitals Entered By: Army Fossa CMA (September 10, 2010 8:39 AM) CC: CPX, fasting  Comments "nagging mucus problem"- not coughing a lot if at all at night [facility] [address]    History of Present Illness: Here for Medicare AWV:  1.   Risk factors based on Past M, S, F history: reviewed  2.   Physical Activities: yard work, stationary bike x 2/week 3.   Depression/mood: no problems reported or noted despite stress (see ROS) 4.   Hearing:  no problems noted or reported  5.   ADL's: independent  6.   Fall Risk: no recent problems, doing well 7.   Home Safety: does feel safe at home  8.   Height, weight, &visual acuity: see VS,  vision is good, has glaucoma, sees eye doctor routinely  9.   Counseling: yes  10.   Labs ordered based on risk factors: yes  11.           Referral Coordination, if needed  12.           Care Plan, see a/p  13.            Cognitive Assessment: motor skills very good, cognition and memory wnl   in addition, we discussed the following:   complained of postnasal dripping for a while, mostly at night, does  not make him cough , denies chest congestion BPH-- occasionally urgency  Diabetes-- ambulatory CBGs  low at night (98-100) thinks b/c had to skip meals sometimes  Hypertension-- no recent ambulatory BPs  , good medication compliance hyperlipidemia - -good medication compliance    Preventive Screening-Counseling & Management  Alcohol-Tobacco     Alcohol type: wine  Caffeine-Diet-Exercise     Does Patient Exercise: yes     Times/week: 2  Current Medications (verified): 1)  Enalapril Maleate 20 Mg  Tabs (Enalapril Maleate) .... Take One Tablet Twice Daily. 2)  Verapamil  Hcl Cr 240 Mg Tbcr (Verapamil Hcl) .[name].. 1 By Mouth Two Times A Day 3)  Glucophage 1000 Mg  Tabs (Metformin Hcl) .[name].. 1 By Mouth Bid 4)  Lipitor 20 Mg Tabs (Atorvastatin Calcium) .... Once Daily 5)  Levitra 10 Mg Tabs (Vardenafil Hcl) .... Use As Directed As Needed 6)  Aspirin 81 Mg Tabs (Aspirin) 7)  Multivitamin 8)  Antioxident 9)  Lumigan 0.03 % Soln (Bimatoprost)  Allergies (verified): No Known Drug Allergies  Past History:  Past Medical History: BPH Diabetes Hypertension hyperlipidemia Diverticulitis, hx of ED Glaucoma , sees eye doctor routinely  Allergic rhinitis  Past Surgical History: Reviewed history from 08/30/2008 and no changes required. Inguinal herniorrhaphy Colectomy with reversal due to diverticulitis per patient (in 1990s aprox) SBO repair  Social History: Does Patient Exercise:  yes  Review of Systems CV:  Denies chest pain or discomfort and swelling of feet. Resp:  Denies shortness of breath. GI:  Denies abdominal pain, nausea, and vomiting;  occasionally his stools look darker than usual, no blood in the stools. . GU:  Denies dysuria, hematuria, and urinary hesitancy. Psych:  ++ stress ,  3 family members have been sick including his wife  who had a CABG. Endo:  no symptoms c/w hypoglycemia . Allergy:  Denies itching eyes and sneezing.  Physical Exam  General:  alert, well-developed, and well-nourished.   Head:   face symmetric nontender to palpation of the maxillary sinuses Ears:  R ear normal and L ear normal.   Nose:   slightly congested Mouth:   no redness or discharge Lungs:  Normal respiratory effort, chest expands symmetrically. Lungs are clear to auscultation, no crackles or wheezes. Heart:  normal rate, regular rhythm, and no murmur.   Abdomen:  soft and non-tender.  soft and non-tender.   Rectal:   few external skin tags  noted. Normal sphincter tone. No rectal masses or tenderness. Prostate:  Prostate gland firm and smooth, no  nodularity, tenderness, mass, asymmetry or induration. ?slt enlarged  Extremities:   no lower extremity edema Psych:  Oriented X3, memory intact for recent and remote, normally interactive, good eye contact, not anxious appearing, and not depressed appearing.   good spirits   Impression & Recommendations:  Problem # 1:  HEALTH SCREENING (ICD-V70.0)  Td 2008 pneumonia shot 06-2009  shingles immunization-- benefits discussed, prescription provided  last colonoscopy 11-2008, next in 5 years  encouraged to continue with his healthy lifestyle  occasionally has symptoms of BPH, check a PSA. Digital rectal exam with no worrisome findings Labs  a healthy diet and continue with his healthy lifestyle encourage  Orders: Medicare -1st Annual Wellness Visit [phone])  Problem # 2:  HYPERTENSION (ICD-401.9)   BP slightly elevated, see instructions His updated medication list for this problem includes:    Enalapril Maleate 20 Mg Tabs (Enalapril maleate) .[name]... Take one tablet twice daily.    Verapamil Hcl Cr 240 Mg Tbcr (Verapamil hcl) .[name]... 1 by mouth two times a day    BP today: 154/80 Prior BP: 130/80 (05/14/2010)  Prior 10 Yr Risk Heart Disease: 27 % (11/18/2007)  Labs Reviewed: K+: 4.5 (05/29/2010) Creat: : 1.0 (05/14/2010)   Chol: 126 (11/22/2009)   HDL: 48.40 (11/22/2009)   LDL: 72 (11/22/2009)   TG: 27.0 (11/22/2009)  Orders: Venipuncture (98119) TLB-BMP (Basic Metabolic Panel-BMET) (80048-METABOL) Specimen Handling (14782)  Problem # 3:  ALLERGIC RHINITIS (ICD-477.9)  see history of present illness, has symptoms consistent with allergic rhinitis, start Flonase. Will send a copy to his eye doctor Dr. Emily Filbert  to be sure that Flonase won't affect his glaucoma  His updated medication list for this problem includes:    Flonase 50 Mcg/act Susp (Fluticasone propionate) .[name]... 2 sprays on each side once daily  Problem # 4:  DM (ICD-250.00)   continue with his healthy lifestyle,   occasionally has to skip  meals, risk of hypoglycemia fortunately is low. His updated medication list for this problem includes:    Enalapril Maleate 20 Mg Tabs (Enalapril maleate) .[name]... Take one tablet twice daily.    Glucophage 1000 Mg Tabs (Metformin hcl) .[name]... 1 by mouth bid    Aspirin 81 Mg Tabs (Aspirin)    Labs Reviewed: Creat: 1.0 (05/14/2010)    Reviewed HgBA1c results: 6.9 (05/14/2010)  6.4 (11/22/2009)  Orders: TLB-A1C / Hgb A1C (Glycohemoglobin) (83036-A1C) Specimen Handling (95621)  Problem # 5:  THROMBOCYTOPENIA (ICD-287.5)  Orders: TLB-CBC Platelet - w/Differential (85025-CBCD) Specimen Handling (30865)  Complete Medication List: 1)  Enalapril Maleate 20 Mg Tabs (Enalapril maleate) .... Take one tablet twice daily. 2)  Verapamil Hcl Cr 240 Mg Tbcr (Verapamil hcl) .[name].. 1 by mouth two times a day 3)  Glucophage 1000 Mg  Tabs (Metformin hcl) .[name].. 1 by mouth bid 4)  Lipitor 20 Mg Tabs (Atorvastatin calcium) .... Once daily 5)  Levitra 10 Mg Tabs (Vardenafil hcl) .... Use as directed as needed 6)  Aspirin 81 Mg Tabs (Aspirin) 7)  Multivitamin  8)  Antioxident  9)  Lumigan 0.03 % Soln (Bimatoprost) 10)  Zostavax 16109 Unt/0.73ml Solr (Zoster vaccine live) .[name].. 1 s.q.1 11)  Flonase 50 Mcg/act Susp (Fluticasone propionate) .... 2 sprays on each side once daily  Other Orders: TLB-ALT (SGPT) (84460-ALT) TLB-AST (SGOT) (84450-SGOT) TLB-Lipid Panel (80061-LIPID) TLB-PSA (Prostate Specific Antigen) (84153-PSA)  Patient Instructions: 1)  Check your blood pressure 2 or 3 times a week. If it is more than 140/85 consistently,please let us know  2)  Please schedule a follow-up appointment in 4 months .  Prescriptions: FLONASE 50 MCG/ACT SUSP (FLUTICASONE PROPIONATE) 2 sprays on each side once daily  #1 x 12   Entered and Authorized by:   Ariv Penrod E. Hannah Strader MD   Signed by:   Nolon Rod. Porshia Blizzard MD on 09/10/2010   Method used:   Print then Give to Patient   RxID:    [id number] ZOSTAVAX 95621 UNT/0.65ML SOLR (ZOSTER VACCINE LIVE) 1 s.q.1  #1 x 0   Entered and Authorized by:   Nolon Rod. Ieesha Abbasi MD   Signed by:   Nolon Rod. Dominga Mcduffie MD on 09/10/2010   Method used:   Print then Give to Patient   RxID:   [id number]    Orders Added: 1)  Venipuncture [41324] 2)  TLB-BMP (Basic Metabolic Panel-BMET) [80048-METABOL] 3)  TLB-CBC Platelet - w/Differential [85025-CBCD] 4)  TLB-ALT (SGPT) [84460-ALT] 5)  TLB-AST (SGOT) [84450-SGOT] 6)  TLB-Lipid Panel [80061-LIPID] 7)  TLB-PSA (Prostate Specific Antigen) [84153-PSA] 8)  TLB-A1C / Hgb A1C (Glycohemoglobin) [83036-A1C] 9)  Specimen Handling [99000] 10)  Medicare -1st Annual Wellness Visit [G0438] 11)  Est. Patient Level III [40102]     Risk Factors:     Type:  wine Exercise:  yes    Times per week:  2

## 2010-10-02 ENCOUNTER — Other Ambulatory Visit: Payer: Self-pay | Admitting: Internal Medicine

## 2010-10-02 NOTE — Telephone Encounter (Signed)
Ok Rx 30 and 12 RF

## 2010-10-03 ENCOUNTER — Other Ambulatory Visit: Payer: Self-pay | Admitting: *Deleted

## 2010-10-03 MED ORDER — ATORVASTATIN CALCIUM 20 MG PO TABS: 20.0000 mg | ORAL_TABLET | Freq: Every day | ORAL | Status: DC

## 2010-11-22 NOTE — Discharge Summary (Signed)
NAME:  Christian Erickson, Christian Erickson                          ACCOUNT NO.:  [id number]   MEDICAL RECORD NO.:  [id number]                   PATIENT TYPE:  INP   LOCATION:  5740                                 FACILITY:  [facility]   PHYSICIAN:  Leonie Man, M.D.                DATE OF BIRTH:  Sep 10, 1930   DATE OF ADMISSION:  05/24/2002  DATE OF DISCHARGE:  06/08/2002                                 DISCHARGE SUMMARY   ADMISSION DIAGNOSES:  1. Partial small bowel obstruction.  2. Ventral hernias.  3. Hypertension.  4. Diabetes mellitus.  5. Glaucoma.   DISCHARGE DIAGNOSES:  1. Partial small bowel obstruction.  2. Ventral hernias.  3. Hypertension.  4. Diabetes mellitus.  5. Glaucoma.   PROCEDURE:  Exploratory laparotomy, adhesiolysis, and repair of ventral  hernias.   COMPLICATIONS:  Prolonged postoperative ileus.   CONDITION ON DISCHARGE:  Improved.   HOSPITAL COURSE:  The patient is a 77 year old man admitted on May 24, 2002, after undergoing several episodes of crampy abdominal pain, nausea and  vomiting, and with x-rays showing partial small bowel obstruction.  On  evaluation, he was also noted to have a moderately large swiss cheese-like  ventral hernia of the anterior abdominal wall.  The patient was admitted to  the hospital and on the day of admission taken to the operating room where  he underwent an exploratory laparotomy with a very extensive adhesiolysis  and repair of his ventral hernia with mesh graft.  His postoperative course  was benign initially except that he, following reasonable course, he was  started again on clear liquids, but did not tolerate this and developed an  abdominal distention, nausea and vomiting.  He had to be decompressed with a  nasogastric tube and started on total parenteral nutrition for a prolonged  period of time until his ileus resolved.  KUB taken during this course  showed no evidence of recurrent small bowel obstruction, but of a  persistent  colonic ileus.  During the course of his hospital stay, his diabetes was  under excellent control as was his blood pressure.  At the time of  discharge, he is feeling well and tolerating a 2000 calorie diabetic diet  without difficulty.  Bowel activity is normal. Wounds are healing well.  He  is being discharged to follow up in the office in two weeks.   DISCHARGE MEDICATIONS:  1. Darvocet one to two every four hours p.r.n. for pain.  2. Glucophage 500 mg b.i.d.  3. Vasotec 20 mg p.o. b.i.d.  4. Verapamil 240 mg b.i.d.  5. Lipitor 20 mg p.o. q.h.s.  6. One aspirin daily.  7. Travatan eyedrops for his glaucoma.   ACTIVITY:  As tolerated except that he is not to lift any weight heavier  than 15 pounds for the next four to six weeks.   DIET:  2000 calorie American Diabetic Association diet.  FOLLOW UP:  He is to follow up in the office in two weeks.                                               Leonie Man, M.D.    PB/MEDQ  D:  06/07/2002  T:  06/08/2002  Job:  [id number]

## 2010-11-22 NOTE — Op Note (Signed)
NAME:  Christian Erickson, Christian Erickson                          ACCOUNT NO.:  [id number]   MEDICAL RECORD NO.:  [id number]                   PATIENT TYPE:  INP   LOCATION:  2550                                 FACILITY:  [facility]   PHYSICIAN:  Leonie Man, M.D.                DATE OF BIRTH:  1931/01/12   DATE OF PROCEDURE:  05/24/2002  DATE OF DISCHARGE:                                 OPERATIVE REPORT   PREOPERATIVE DIAGNOSES:  1. Recurrent partial small bowel obstruction.  2. Ventral hernia.   POSTOPERATIVE DIAGNOSES:  1. Recurrent partial small bowel obstruction.  2. Ventral hernia.   OPERATION PERFORMED:  1. Exploratory laparotomy and adhesiolysis.  2. Repair of ventral hernia with dual mesh.   SURGEON:  Leonie Man, M.D.   ASSISTANT:  Joanne Gavel, M.D.   ANESTHESIA:  General.   INDICATIONS FOR PROCEDURE:  The patient is a 77 year old man who has had  recurrent episodes bloating and abdominal pain with no passage of flatus,  nausea and emesis.  On the most recent occasion, he was evaluated through  the emergency room with severe cramping and abdominal pain.  He underwent  upper GI and small bowel series which show an area of tight partial  obstruction in the mid abdomen.  This patient has had previous abdominal  surgery for perforated diverticulitis with colostomy and subsequent  colostomy closure.  He comes to the operating room now after the risks and  potential benefits of surgery have been fully discussed, all questions  answered and consent obtained.   DESCRIPTION OF PROCEDURE:  Following the induction of satisfactory general  anesthesia with the patient positioned supinely, the abdomen was prepped and  draped to be included in a sterile operative field.  The urinary bladder was  cannulated with a Foley catheter.  Exploratory laparotomy was carried out  through a midline incision after the old scar cicatrix from his previous  abdominal surgery had been removed.  On opening the  abdomen, multiple  adhesions were immediately encountered both to the anterior abdominal wall  down into the pelvis and to the upper abdomen in the subdiaphragmatic area.  The incision was carefully extended down into the pelvis and an extensive  adhesiolysis was carried out from the ileocecal valve to the ligament of  Treitz, lysing multiple adhesions.  No enterotomies were created.  At the  end of this extensive adhesiolysis, from which multiple bowel loops were  noted to be incarcerated within a Swiss cheese like hernia was cleared.  We  used a large composite dual mesh to cover the area of the defect suturing it  at four points toward the xiphoid laterally and inferiorly.  Using a endo  stapling tacker, the mesh was tacked into the anterior abdominal wall in a  fashion so as to hold the mesh into place.  Sponge, instrument and sharp  counts were then verified.  The overlying  fascia was then closed with a  running suture of #1 Novofil closing this over the mesh.  The subcutaneous  tissues were then irrigated and the skin closed with  stainless steel staples.  Anesthetic was reversed and the patient removed  from the operating room to the recovery room in stable condition, having  tolerated the procedure well.  [                                               Leonie Man, M.D.    PB/MEDQ  D:  05/24/2002  T:  05/24/2002  Job:  [id number]   cc:   Thelma Barge P. [place]esto Charon, M.D.

## 2010-12-09 ENCOUNTER — Other Ambulatory Visit: Payer: Self-pay | Admitting: Internal Medicine

## 2011-01-13 ENCOUNTER — Encounter: Payer: Self-pay | Admitting: Internal Medicine

## 2011-01-14 ENCOUNTER — Encounter: Payer: Self-pay | Admitting: Internal Medicine

## 2011-01-14 ENCOUNTER — Other Ambulatory Visit: Payer: Self-pay | Admitting: Internal Medicine

## 2011-01-14 ENCOUNTER — Ambulatory Visit (INDEPENDENT_AMBULATORY_CARE_PROVIDER_SITE_OTHER): Payer: Medicare Other | Admitting: Internal Medicine

## 2011-01-14 VITALS — BP 146/82 | HR 65 | Wt 166.5 lb

## 2011-01-14 DIAGNOSIS — E119 Type 2 diabetes mellitus without complications: Secondary | ICD-10-CM

## 2011-01-14 NOTE — Assessment & Plan Note (Addendum)
Doing well, see HPI Feet exam negative today except for slt long nails, rec trimming , offered referral to podiatrist; appropriate feet care discussed  Labs  Form for diabetic shoes filled (he does not meet criteria)

## 2011-01-14 NOTE — Progress Notes (Signed)
  Subjective:    Patient ID: Christian Erickson, male    DOB: [birth year], 77 y.o.   MRN: [id number]  HPI ROV Doing well   Past Medical History  Diagnosis Date  . BPH (benign prostatic hypertrophy)   . Diabetes mellitus   . Hypertension   . Hyperlipidemia   . Diverticulitis   . ED (erectile dysfunction)   . Glaucoma     sees eye doctor routinely  . Allergic rhinitis     Past Surgical History  Procedure Date  . Inguinal hernia repair   . Colectomy     w/ reversal due to divertiuclitis per patient in 1990s aprox  . Sbo repair       Review of Systems Good med compliance, amb CBGs wnl, occ in the low side in the afternoon (80-90s associated w/ mild HAs, takes a snack, skips metformin  and feels better). Denies any lower extremity paresthesias, feet pain. Ambulatory blood pressures always within normal.    Objective:   Physical Exam  Constitutional: He appears well-developed and well-nourished.  Cardiovascular: Normal rate, regular rhythm and normal heart sounds.   No murmur heard. Pulmonary/Chest: Effort normal and breath sounds normal. No respiratory distress. He has no wheezes. He has no rales.  Musculoskeletal:       DIABETIC FEET EXAM: No lower extremity edema Normal pedal pulses bilaterally Skin  normal without calluses Pinprick examination of the feet normal. Nails thick and slt long           Assessment & Plan:

## 2011-01-16 ENCOUNTER — Telehealth: Payer: Self-pay | Admitting: *Deleted

## 2011-01-16 NOTE — Telephone Encounter (Signed)
Message left for patient to return my call.  

## 2011-01-16 NOTE — Telephone Encounter (Signed)
Message copied by Leanne Lovely on Thu Jan 16, 2011 10:34 AM ------      Message from: Willow Ora E      Created: Wed Jan 15, 2011  5:56 PM       Advised patient, diabetes continued to be well-controlled. Good results. Continue with same medicines.

## 2011-01-17 NOTE — Telephone Encounter (Signed)
Pt is aware.  

## 2011-02-18 ENCOUNTER — Other Ambulatory Visit: Payer: Self-pay | Admitting: Internal Medicine

## 2011-02-18 NOTE — Telephone Encounter (Signed)
Rx Done . 

## 2011-05-20 ENCOUNTER — Other Ambulatory Visit: Payer: Self-pay | Admitting: Internal Medicine

## 2011-05-21 NOTE — Telephone Encounter (Signed)
Ok 1 month and 4 RF 

## 2011-06-19 ENCOUNTER — Ambulatory Visit (INDEPENDENT_AMBULATORY_CARE_PROVIDER_SITE_OTHER): Payer: Medicare Other

## 2011-06-19 DIAGNOSIS — Z23 Encounter for immunization: Secondary | ICD-10-CM

## 2011-06-23 ENCOUNTER — Encounter: Payer: Self-pay | Admitting: Internal Medicine

## 2011-06-24 ENCOUNTER — Ambulatory Visit (INDEPENDENT_AMBULATORY_CARE_PROVIDER_SITE_OTHER): Payer: Medicare Other | Admitting: Internal Medicine

## 2011-06-24 VITALS — BP 140/56 | HR 65 | Temp 98.8°F | Ht 65.5 in | Wt 170.0 lb

## 2011-06-24 DIAGNOSIS — M1990 Unspecified osteoarthritis, unspecified site: Secondary | ICD-10-CM

## 2011-06-24 NOTE — Progress Notes (Signed)
  Subjective:    Patient ID: Christian Erickson, male    DOB: [birth year], 77 y.o.   MRN: [id number]  HPI Acute visit for knee pain Long history of right knee pain, worse in the last few months. The pain is specifically distal and medial from the patella, occasional swelling. OTC brings temporarily relief and actually he does not like to take any meds if possible.  Past Medical History  Diagnosis Date  . BPH (benign prostatic hypertrophy)   . Diabetes mellitus   . Hypertension   . Hyperlipidemia   . Diverticulitis   . ED (erectile dysfunction)   . Glaucoma     sees eye doctor routinely  . Allergic rhinitis    Past Surgical History  Procedure Date  . Inguinal hernia repair   . Colectomy     w/ reversal due to divertiuclitis per patient in 1990s aprox  . Sbo repair      Review of Systems No fever or chills No acute injury but he is very active in the yard    Objective:   Physical Exam  Constitutional: He appears well-developed and well-nourished.  Musculoskeletal: He exhibits no edema.       Left knee normal. Right knee: Range of motion normal, slight puffiness distal and medial from the patella, no actual effusion that I can tell. The joint is not hot or warm       Assessment & Plan:

## 2011-06-24 NOTE — Assessment & Plan Note (Signed)
On-and-off knee pain, getting worse, likely due to DJD. The patient doesnot desire to take chronic medications, I think the best plan is to see orthopedic surgery, local injection?. In the meantime I recommend to use a knee sleeve and ice as needed.

## 2011-06-25 ENCOUNTER — Encounter: Payer: Self-pay | Admitting: Internal Medicine

## 2011-07-15 ENCOUNTER — Ambulatory Visit: Payer: Medicare Other | Admitting: Internal Medicine

## 2011-08-29 ENCOUNTER — Telehealth: Payer: Self-pay | Admitting: Internal Medicine

## 2011-08-29 NOTE — Telephone Encounter (Signed)
Please call regarding bill received for $50.00 no show fee from [facility]. He has spoken to the billing office & is not happy with the outcome. Thank You

## 2011-09-05 ENCOUNTER — Ambulatory Visit (INDEPENDENT_AMBULATORY_CARE_PROVIDER_SITE_OTHER): Payer: Medicare Other | Admitting: Internal Medicine

## 2011-09-05 VITALS — BP 160/86 | HR 100 | Temp 98.6°F | Wt 156.0 lb

## 2011-09-05 DIAGNOSIS — N400 Enlarged prostate without lower urinary tract symptoms: Secondary | ICD-10-CM

## 2011-09-05 DIAGNOSIS — T783XXA Angioneurotic edema, initial encounter: Secondary | ICD-10-CM

## 2011-09-05 DIAGNOSIS — I10 Essential (primary) hypertension: Secondary | ICD-10-CM

## 2011-09-05 LAB — POCT URINALYSIS DIPSTICK
Ketones, UA: NEGATIVE
Spec Grav, UA: 1.015
Urobilinogen, UA: 0.2

## 2011-09-05 MED ORDER — CIPROFLOXACIN HCL 500 MG PO TABS: 500.0000 mg | ORAL_TABLET | Freq: Two times a day (BID) | ORAL | Status: AC

## 2011-09-05 MED ORDER — TAMSULOSIN HCL 0.4 MG PO CAPS: 0.4000 mg | ORAL_CAPSULE | Freq: Every day | ORAL | Status: DC

## 2011-09-05 NOTE — Patient Instructions (Signed)
Ciprofloxacin for 10 days Flomax once daily to help prostate Call anytime if you have a harder time urinating or if  fever, chills, nausea or back pain. ---------------------------------------------------------------------------------------- Stop Vasotec, instead use clonidine twice a day. Call if side effects or if your blood pressure is more than 140/85 or less than 110/60.

## 2011-09-05 NOTE — Assessment & Plan Note (Signed)
Acute increase in previously mild BPH symptoms. Prostatitis? Plan:  UA, urine culture. Cipro for 10 days. Flomax for symptom relief.

## 2011-09-05 NOTE — Assessment & Plan Note (Signed)
See history of present illness, the patient is describing a  episode of angioedema in the setting of taking ACE inhibitor Plan: Discontinue ACEi, clonidine, reassess in 4 weeks

## 2011-09-05 NOTE — Progress Notes (Signed)
  Subjective:    Patient ID: Christian Erickson, male    DOB: 1930/11/21, 78 y.o.   MRN: [id number]  HPI Acute visit Symptoms onset 5 days ago, urinary frequency almost once an hour, some urgency, once he goes to urinate it "trickles down", feels like he cannot empty her bladder completely.  Additionally, he went to urgent care a few weeks ago with lip swelling, reports that this was severe and he could not recognize himself in the mirror, at the urgent care he was prescribed a shot and released home. This is the second time this happened, previous episode 2 years ago. He is taking ACE inhibitors.   Past Medical History  Diagnosis Date  . BPH (benign prostatic hypertrophy)   . Diabetes mellitus   . Hypertension   . Hyperlipidemia   . Diverticulitis   . ED (erectile dysfunction)   . Glaucoma     sees eye doctor routinely  . Allergic rhinitis      Review of Systems No fever or chills No nausea, vomiting, diarrhea No actual dysuria or gross hematuria. No flank pain. Diabetes remains well controlled, ambulatory CBCs around 110    Objective:   Physical Exam Alert, oriented x3, no apparent distress. Afebrile BP is slightly elevated at 160/86. Abdomen, not distended, soft, no mass or rebound. No CVA tenderness next line extremities no edema GU: DRE with no rectal abnormalities, prostate is slightly enlarged, more so on the right side. Slightly tender on the R?. No nodularity.      Assessment & Plan:

## 2011-09-06 LAB — URINALYSIS, MICROSCOPIC ONLY: Bacteria, UA: NONE SEEN

## 2011-09-07 ENCOUNTER — Encounter: Payer: Self-pay | Admitting: Internal Medicine

## 2011-09-07 LAB — URINE CULTURE: Organism ID, Bacteria: NO GROWTH

## 2011-09-08 ENCOUNTER — Telehealth: Payer: Self-pay | Admitting: Internal Medicine

## 2011-09-08 NOTE — Assessment & Plan Note (Signed)
D/c vasotec, start clonidine. (Rx was called in, i sent 2 flomax Rx)

## 2011-09-08 NOTE — Telephone Encounter (Signed)
Called to check on the patient, good compliance w/ all meds. BPH sx a lot better  Had a HA yesterday, mild, resolved today Recommend to continue w/ meds, check his BP at the pharmacy, call if BP elevated or HA

## 2011-09-12 NOTE — Telephone Encounter (Signed)
Sent for reversal No  Show fee 09/12/11.  Stephanie McDaniels to call and advise.

## 2011-09-15 ENCOUNTER — Telehealth: Payer: Self-pay | Admitting: Internal Medicine

## 2011-09-15 MED ORDER — DOXAZOSIN MESYLATE 2 MG PO TABS: 2.0000 mg | ORAL_TABLET | Freq: Every day | ORAL | Status: DC

## 2011-09-15 NOTE — Telephone Encounter (Signed)
D/c clonidine, in the past he took cardura w/o problems per chart review  rec to start cardura, 1 tab at night, I already sent a Rx . Call if s/e

## 2011-09-15 NOTE — Telephone Encounter (Signed)
Patient states Dr. Drue Novel instructed him to call if he experienced any side effects from his new bp meds. He called [facility], but did not want to wait for a call back. The medicine is lowering his bp, but he is experiencing side effects including light headedness, tremors, and headache. He would like a call back to discuss and modify med or dosage if necessary.

## 2011-09-15 NOTE — Telephone Encounter (Signed)
Patient complains that he is having side effects of the Clonidine.2mg .  He said he is having dry mouth, dizziness, tremors.  He states that his BP readings have been as follows: 09/09/11 @ 1 am 140/67 and at 4:30p 127/65 , 09/10/11 @11 :10a 141/68 , 09/11/11 @ 6:28p 166/76 and @ 6:41p 126/61.  He is more concerned with the side effects of the medication and would rather change it. Please advise.

## 2011-09-16 NOTE — Telephone Encounter (Signed)
Patient notified of medication changes and to continue to monitor his blood pressure.  If anything changes to call us back.

## 2011-10-07 ENCOUNTER — Encounter: Payer: Self-pay | Admitting: Internal Medicine

## 2011-10-07 ENCOUNTER — Ambulatory Visit (INDEPENDENT_AMBULATORY_CARE_PROVIDER_SITE_OTHER): Payer: Medicare Other | Admitting: Internal Medicine

## 2011-10-07 VITALS — BP 162/88 | HR 93 | Temp 98.1°F | Wt 170.0 lb

## 2011-10-07 DIAGNOSIS — I10 Essential (primary) hypertension: Secondary | ICD-10-CM

## 2011-10-07 DIAGNOSIS — N400 Enlarged prostate without lower urinary tract symptoms: Secondary | ICD-10-CM

## 2011-10-07 DIAGNOSIS — E119 Type 2 diabetes mellitus without complications: Secondary | ICD-10-CM

## 2011-10-07 LAB — BASIC METABOLIC PANEL
CO2: 30 mEq/L (ref 19–32)
Calcium: 8.9 mg/dL (ref 8.4–10.5)
Creatinine, Ser: 1 mg/dL (ref 0.4–1.5)
GFR: 97.17 mL/min (ref >60.00)
Glucose, Bld: 141 mg/dL — ABNORMAL HIGH (ref 70–99)
Sodium: 140 mEq/L (ref 135–145)

## 2011-10-07 NOTE — Assessment & Plan Note (Signed)
Due for labs

## 2011-10-07 NOTE — Assessment & Plan Note (Signed)
He was seen with increased urinary symptoms last month, urine culture was negative, PSA was normal, status post empiric Cipro. He is now also on Flomax and Cardura. Feeling great. No change for now.

## 2011-10-07 NOTE — Assessment & Plan Note (Signed)
Since the last office visit, he was intolerant of clonidin. He is now on Cardura and doing great.Ambulatory blood pressures are completely normal. BP today slightly elevated Plan: No change, continue monitor BPs. Reassess in 3 months.

## 2011-10-07 NOTE — Progress Notes (Signed)
  Subjective:    Patient ID: Christian Erickson, male    DOB: April 23, 1931, 78 y.o.   MRN: [id number]  HPI Followup from visit,  feeling very well.  Past Medical History  Diagnosis Date  . BPH (benign prostatic hypertrophy)   . Diabetes mellitus   . Hypertension   . Hyperlipidemia   . Diverticulitis   . ED (erectile dysfunction)   . Glaucoma     sees eye doctor routinely  . Allergic rhinitis       Review of Systems urinary symptoms resolved, denies frequency,  Has a good flow. No further angioedema since we discontinue Vasotec. He was intolerant to clonidine due to dry mouth, was switched to Cardura, he takes it at bedtime, no apparent side effects. Ambulatory blood pressures are less than 130/70 most of the time particularly in the last few days. BP today elevated, he was in a rush to get to the office.    Objective:   Physical Exam General -- alert, well-developed, and well-nourished. NAD  Lungs -- normal respiratory effort, no intercostal retractions, no accessory muscle use, and normal breath sounds.   Heart-- normal rate, regular rhythm, no murmur, and no gallop.   Extremities-- no pretibial edema bilaterally      Assessment & Plan:

## 2011-10-15 ENCOUNTER — Encounter: Payer: Self-pay | Admitting: *Deleted

## 2011-10-17 ENCOUNTER — Other Ambulatory Visit: Payer: Self-pay | Admitting: Internal Medicine

## 2011-10-17 NOTE — Telephone Encounter (Signed)
Refill done.  

## 2011-11-02 ENCOUNTER — Other Ambulatory Visit: Payer: Self-pay | Admitting: Internal Medicine

## 2011-11-04 NOTE — Telephone Encounter (Signed)
Refill done.  

## 2012-01-03 ENCOUNTER — Other Ambulatory Visit: Payer: Self-pay | Admitting: Internal Medicine

## 2012-01-05 NOTE — Telephone Encounter (Signed)
Refill done.  

## 2012-01-06 ENCOUNTER — Encounter: Payer: Self-pay | Admitting: Internal Medicine

## 2012-01-06 ENCOUNTER — Encounter (INDEPENDENT_AMBULATORY_CARE_PROVIDER_SITE_OTHER): Payer: Medicare Other

## 2012-01-06 ENCOUNTER — Ambulatory Visit (INDEPENDENT_AMBULATORY_CARE_PROVIDER_SITE_OTHER): Payer: Medicare Other | Admitting: Internal Medicine

## 2012-01-06 VITALS — BP 134/80 | HR 70 | Temp 98.6°F | Ht 65.0 in | Wt 169.0 lb

## 2012-01-06 DIAGNOSIS — R609 Edema, unspecified: Secondary | ICD-10-CM

## 2012-01-06 DIAGNOSIS — E785 Hyperlipidemia, unspecified: Secondary | ICD-10-CM

## 2012-01-06 DIAGNOSIS — E119 Type 2 diabetes mellitus without complications: Secondary | ICD-10-CM

## 2012-01-06 DIAGNOSIS — I10 Essential (primary) hypertension: Secondary | ICD-10-CM

## 2012-01-06 DIAGNOSIS — M7989 Other specified soft tissue disorders: Secondary | ICD-10-CM

## 2012-01-06 DIAGNOSIS — Z0000 Encounter for general adult medical examination without abnormal findings: Secondary | ICD-10-CM

## 2012-01-06 LAB — CBC WITH DIFFERENTIAL/PLATELET
Basophils Absolute: 0 10*3/uL (ref 0.0–0.1)
HCT: 42 % (ref 39.0–52.0)
Hemoglobin: 13.9 g/dL (ref 13.0–17.0)
Lymphs Abs: 1.4 10*3/uL (ref 0.7–4.0)
MCHC: 33 g/dL (ref 30.0–36.0)
MCV: 91.6 fl (ref 78.0–100.0)
Monocytes Absolute: 0.4 10*3/uL (ref 0.1–1.0)
Monocytes Relative: 7.4 % (ref 3.0–12.0)
Neutro Abs: 4 10*3/uL (ref 1.4–7.7)
Platelets: 128 10*3/uL — ABNORMAL LOW (ref 150.0–400.0)
RDW: 14.9 % — ABNORMAL HIGH (ref 11.5–14.6)

## 2012-01-06 LAB — LIPID PANEL
Cholesterol: 142 mg/dL (ref 0–200)
HDL: 58.1 mg/dL (ref >39.00)
Triglycerides: 32 mg/dL (ref 0.0–149.0)
VLDL: 6.4 mg/dL (ref 0.0–40.0)

## 2012-01-06 LAB — HEMOGLOBIN A1C: Hgb A1c MFr Bld: 7.8 % — ABNORMAL HIGH (ref 4.6–6.5)

## 2012-01-06 LAB — AST: AST: 20 U/L (ref 0–37)

## 2012-01-06 LAB — ALT: ALT: 22 U/L (ref 0–53)

## 2012-01-06 MED ORDER — METFORMIN HCL 1000 MG PO TABS: 1000.0000 mg | ORAL_TABLET | Freq: Two times a day (BID) | ORAL | Status: DC

## 2012-01-06 NOTE — Assessment & Plan Note (Addendum)
Asymmetric edema, worse at the left ankle and foot. Check TSH To be sure we'll check a ultrasound. Rule out DVT. Addendum-- U/S reportedly neg, will notify pt

## 2012-01-06 NOTE — Assessment & Plan Note (Addendum)
Feet exam negative today Last A1c not at goal,  has improved his lifestyle. Check the A1c, if not at goal , will add Januvia to metformin Feet exam normal today, nails long--trimming!

## 2012-01-06 NOTE — Assessment & Plan Note (Signed)
Good medication compliance, check FLP. 

## 2012-01-06 NOTE — Assessment & Plan Note (Addendum)
Td 2008 pneumonia shot 06-2009  shingles immunization--  09-2010  last colonoscopy 11-2008, next in 5 years symptoms of BPH improved w/  flomax Re asses need of prostate cancer screening next year Discussed diet and exercise

## 2012-01-06 NOTE — Progress Notes (Signed)
  Subjective:    Patient ID: Christian Erickson, male    DOB: 04-07-1931, 78 y.o.   MRN: [id number]  HPI Here for Medicare annual exam 1. Risk factors based on Past M, S, F history: reviewed  2. Physical Activities: yard work, stationary bike x 2/week 3. Depression/mood: no problems reported or noted despite stress   4. Hearing:  no problems noted or reported  5. ADL's: independent  6. Fall Risk: no recent problems, prevention discussed 7. Home Safety: does feel safe at home  8. Height, weight, &visual acuity: see VS,  vision is good, has glaucoma, sees eye doctor routinely  9. Counseling: yes  10. Labs ordered based on risk factors: yes  11.       Referral Coordination, if needed  12.       Care Plan, see a/p  13.        Cognitive Assessment: motor skills very good, cognition and memory wnl  in addition, we discussed the following: Diabetes, good medication compliance, CBGs checked at night and they around 120, 110. High cholesterol, good medication compliance, no apparent problems. Hypertension, only taking Cardura, not taking verapamil. Ambulatory BPs excellent at 130/80   Past Medical History: BPH Diabetes Hypertension hyperlipidemia Diverticulitis, hx of ED Glaucoma , sees eye doctor routinely  Allergic rhinitis  Past Surgical History: Inguinal herniorrhaphy Colectomy with reversal due to diverticulitis per patient (in 1990s aprox) SBO repair  Social History: Married x 44 years, children x 2 , 2 G daughters RetiredEngineer, drilling, [employer] Tobacco-- rarely many years ago  ETOH-- socially   Family History CAD-- several family  Members (in their 25s) DM-- cousin Strokes -- brothers x 2 Prostate ca-- no Colon ca-- no  Review of Systems No chest pain or shortness of breath No nausea, vomiting, diarrhea or blood in the stools. BPH symptoms are great with cardura  No dysuria or gross hematuria. Still have some allergies, mostly runny nose, intolerant to nose  sprays.    Objective:   Physical Exam  General -- alert, well-developed. No apparent distress.  Neck --no thyromegaly Lungs -- normal respiratory effort, no intercostal retractions, no accessory muscle use, and normal breath sounds.   Heart-- normal rate, regular rhythm, no murmur, and no gallop.   Abdomen--soft, non-tender, no distention, no masses, no HSM, no guarding, and no rigidity.   Extremities-- calves symmetric and not tender, mild right ankle-foot swelling, definitely worse on the left. DIABETIC FEET EXAM: Normal pedal pulses bilaterally Skin   normal without calluses, nails long  Pinprick examination of the feet normal. Neurologic-- alert & oriented X3 and strength normal in all extremities. Psych-- Cognition and judgment appear intact. Alert and cooperative with normal attention span and concentration.  not anxious appearing and not depressed appearing.       Assessment & Plan:

## 2012-01-06 NOTE — Assessment & Plan Note (Signed)
Well-controlled on Cardura only. Not taking verapamil. No change

## 2012-01-13 ENCOUNTER — Telehealth: Payer: Self-pay | Admitting: *Deleted

## 2012-01-13 MED ORDER — SITAGLIPTIN PHOSPHATE 100 MG PO TABS: 100.0000 mg | ORAL_TABLET | Freq: Every day | ORAL | Status: DC

## 2012-01-13 NOTE — Telephone Encounter (Signed)
Patient informed of results and MD instructions; new Rx to pharmacy, new labs appt scheduled for 07.10.13 @ 11:00am/SLS

## 2012-01-14 ENCOUNTER — Other Ambulatory Visit: Payer: Medicare Other

## 2012-01-15 ENCOUNTER — Other Ambulatory Visit: Payer: Medicare Other

## 2012-01-16 ENCOUNTER — Other Ambulatory Visit (INDEPENDENT_AMBULATORY_CARE_PROVIDER_SITE_OTHER): Payer: Medicare Other

## 2012-01-16 DIAGNOSIS — E785 Hyperlipidemia, unspecified: Secondary | ICD-10-CM

## 2012-01-17 LAB — T4, FREE: Free T4: 0.97 ng/dL (ref 0.80–1.80)

## 2012-01-20 ENCOUNTER — Encounter: Payer: Self-pay | Admitting: Internal Medicine

## 2012-01-20 ENCOUNTER — Ambulatory Visit (INDEPENDENT_AMBULATORY_CARE_PROVIDER_SITE_OTHER): Payer: Medicare Other | Admitting: Internal Medicine

## 2012-01-20 VITALS — BP 160/82 | HR 87 | Wt 166.0 lb

## 2012-01-20 DIAGNOSIS — R6889 Other general symptoms and signs: Secondary | ICD-10-CM

## 2012-01-20 DIAGNOSIS — E039 Hypothyroidism, unspecified: Secondary | ICD-10-CM | POA: Insufficient documentation

## 2012-01-20 DIAGNOSIS — M1990 Unspecified osteoarthritis, unspecified site: Secondary | ICD-10-CM

## 2012-01-20 DIAGNOSIS — R7989 Other specified abnormal findings of blood chemistry: Secondary | ICD-10-CM

## 2012-01-20 DIAGNOSIS — E119 Type 2 diabetes mellitus without complications: Secondary | ICD-10-CM

## 2012-01-20 NOTE — Assessment & Plan Note (Signed)
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 Patient would like a second opinion, currently the pain is not severe and not limiting his activities. At his request, will refer him to Dr. Eulah Pont

## 2012-01-20 NOTE — Progress Notes (Signed)
  Subjective:    Patient ID: Christian Erickson, male    DOB: 04-12-1931, 78 y.o.   MRN: [id number]  HPI Here to discuss the following issues Abnormal thyroid test Knee pain Diabetes. See assessment and plan. Today , I spent more than 15   min with the patient, >50% of the time counseling   Past Medical History: BPH Diabetes Hypertension hyperlipidemia Diverticulitis, hx of ED Glaucoma , sees eye doctor routinely   Allergic rhinitis  Past Surgical History: Inguinal herniorrhaphy Colectomy with reversal due to diverticulitis per patient (in 1990s aprox) SBO repair  Social History: Married x 44 years, children x 2 , 2 G daughters RetiredEngineer, drilling, [employer] Tobacco-- rarely many years ago   ETOH-- socially   Family History CAD-- several family  Members (in their 60s) DM-- cousin Strokes -- brothers x 2 Prostate ca-- no Colon ca-- no  Review of Systems     Objective:   Physical Exam  Alert oriented x3, no apparent distress.      Assessment & Plan:

## 2012-01-20 NOTE — Assessment & Plan Note (Signed)
Patient has a slightly elevated TSH with normal T3 and T4. I discussed the diagnosis of mild hypothyroidism/subclinical hypothyroidism. We discussed the symptoms including constipation, weight gain, depression etc. He is basically doing well. Plan is to recheck his TSH every 6-12 months. No need for any other intervention at this time.

## 2012-01-20 NOTE — Assessment & Plan Note (Signed)
Based on the last A1c he started Januvia, doing well. All previous A1c is discussed with the patient

## 2012-04-13 ENCOUNTER — Ambulatory Visit: Payer: Medicare Other | Admitting: Internal Medicine

## 2012-05-05 ENCOUNTER — Ambulatory Visit: Payer: Medicare Other

## 2012-05-07 ENCOUNTER — Other Ambulatory Visit: Payer: Self-pay | Admitting: *Deleted

## 2012-05-07 ENCOUNTER — Telehealth: Payer: Self-pay

## 2012-05-07 NOTE — Telephone Encounter (Signed)
Noted  

## 2012-05-07 NOTE — Telephone Encounter (Signed)
Pt states have changed from pharmacy to [facility] home delivery and they will notify you by fax requesting meds. Pt wanted to make you aware of home delivery med program.  MW

## 2012-05-11 ENCOUNTER — Ambulatory Visit (INDEPENDENT_AMBULATORY_CARE_PROVIDER_SITE_OTHER): Payer: Medicare Other

## 2012-05-11 DIAGNOSIS — Z23 Encounter for immunization: Secondary | ICD-10-CM

## 2012-05-14 ENCOUNTER — Telehealth: Payer: Self-pay | Admitting: Internal Medicine

## 2012-05-14 MED ORDER — ATORVASTATIN CALCIUM 20 MG PO TABS: 20.0000 mg | ORAL_TABLET | Freq: Every day | ORAL | Status: DC

## 2012-05-14 MED ORDER — METFORMIN HCL 1000 MG PO TABS: 1000.0000 mg | ORAL_TABLET | Freq: Two times a day (BID) | ORAL | Status: DC

## 2012-05-14 MED ORDER — DOXAZOSIN MESYLATE 2 MG PO TABS: 2.0000 mg | ORAL_TABLET | Freq: Every day | ORAL | Status: DC

## 2012-05-14 NOTE — Telephone Encounter (Signed)
[facility] Triage Call Report Triage Record Num: [id number] Operator: Micah Flesher Patient Name: Christian Erickson Call Date & Time: 05/13/2012 6:14:40PM Patient Phone: [phone] PCP: Nolon Rod. Paz Patient Gender: Male PCP Fax : Patient DOB: [birth year] Practice Name: [facility] Reason for Call: Caller: Christian Erickson/Patient; PCP: Willow Ora; CB#: [phone]; Call regarding Medication Issue; Medication(s): pt rcvd voice mail that some meds were being adjusted, but he missed the call. He s leaving town Advertising account executive. I was unable to find a note regarding the call in EPIC. The pt agreed to call back in the AM. Protocol(s) Used: Office Note Recommended Outcome per Protocol: Information Noted and Sent to Office Reason for Outcome: Caller information to office Care Advice: ~

## 2012-05-14 NOTE — Telephone Encounter (Signed)
Spoke with pt. I just wanted to clarify that [facility] & he wants me to go ahead & refill his meds to this pharmacy. The pt also stated that he has noticed since taking the Januvia he has started having headaches & the pt wanted to know if there is something else he could take. Please advise.

## 2012-05-14 NOTE — Telephone Encounter (Signed)
Okay to send a year supply of all his regular medicines to the new pharmacy except Januvia. Because he is having headaches: hold Januvia. Patient will pick up samples of  onglyza 5 mg, and one tablet daily, #28. If the new medication works and does not give him a headache, will send a prescription for it

## 2012-05-17 NOTE — Telephone Encounter (Signed)
lmovm for pt to return call.  

## 2012-05-18 NOTE — Telephone Encounter (Signed)
Discussed with pt. Samples left at front desk for pt to pick up.

## 2012-05-18 NOTE — Telephone Encounter (Signed)
Pt called back lmovm triage stating returning your call been on vacation for 1 1/2 and f/u with you concerning Januvia questions.   Plz advise pt      MW ZO:[phone] or [phone]

## 2012-05-19 ENCOUNTER — Other Ambulatory Visit: Payer: Self-pay

## 2012-05-19 NOTE — Telephone Encounter (Signed)
Per Dr. Drue Novel pt is no longer taking this med.

## 2012-05-19 NOTE — Telephone Encounter (Signed)
Chelsea from [place] [facility]/ [facility] called requesting 90 day supply LMOVM triage line. (She was talking so fast not sure if this is the only Rx requesting so plz call) Ref# [id number] [phone]  MW

## 2012-05-24 ENCOUNTER — Telehealth: Payer: Self-pay | Admitting: *Deleted

## 2012-05-24 ENCOUNTER — Ambulatory Visit: Payer: Medicare Other | Admitting: Internal Medicine

## 2012-05-24 MED ORDER — DOXAZOSIN MESYLATE 2 MG PO TABS: 2.0000 mg | ORAL_TABLET | Freq: Every day | ORAL | Status: DC

## 2012-05-24 NOTE — Telephone Encounter (Signed)
Refill done.  

## 2012-05-26 ENCOUNTER — Ambulatory Visit (INDEPENDENT_AMBULATORY_CARE_PROVIDER_SITE_OTHER): Payer: Medicare Other | Admitting: Internal Medicine

## 2012-05-26 ENCOUNTER — Encounter: Payer: Self-pay | Admitting: Internal Medicine

## 2012-05-26 VITALS — BP 128/74 | HR 97 | Temp 98.0°F | Wt 169.0 lb

## 2012-05-26 DIAGNOSIS — R413 Other amnesia: Secondary | ICD-10-CM

## 2012-05-26 DIAGNOSIS — E119 Type 2 diabetes mellitus without complications: Secondary | ICD-10-CM

## 2012-05-26 NOTE — Assessment & Plan Note (Signed)
Based on the last A1c, he recommended Januvia, he took it briefly and cause headache. He got samples of onglyza and will try. Reassess in 2 months

## 2012-05-26 NOTE — Patient Instructions (Addendum)
Please take your  medications as prescribed, including onglyza once a day. Watch for side effects. Come back in 8 weeks.

## 2012-05-26 NOTE — Progress Notes (Signed)
  Subjective:    Patient ID: Christian Erickson, male    DOB: August 11, 1930, 78 y.o.   MRN: [id number]  HPI Today's visit was prompted by a patient's wife letter ---> she reports problems with memory. Apparently the patient is getting increasingly forgetful, oftentimes leaves the house and the car doors open. Sometimes is confused about his medications. Occasionally he feels frustrated and angry. Also reports that "his driving is getting bad".  Past Medical History: BPH Diabetes Hypertension hyperlipidemia Diverticulitis, hx of ED Glaucoma , sees eye doctor routinely   Allergic rhinitis  Past Surgical History: Inguinal herniorrhaphy Colectomy with reversal due to diverticulitis per patient (in 1990s aprox) SBO repair  Social History: Married x 44 years, children x 2 , 2 G daughters Wife Anthonette [phone] Retired-- Hotel manager, [employer] Tobacco-- rarely many years ago   ETOH-- socially   Family History CAD-- several family  Members (in their 71s) DM-- cousin Strokes -- brothers x 2 Prostate ca-- no Colon ca-- no    Review of Systems In general the patient reports he is feeling well, denies anxiety or depression. When asked, he did admit that he is getting somehow forgetful but denies major problems.     Objective:   Physical Exam General -- alert, well-developed  Neurologic-- MMSE 28. Psych-- Cognition and judgment appear intact. Alert and cooperative with normal attention span and concentration.  not anxious appearing and not depressed appearing.      Assessment & Plan:  Today , I spent more than 25 min with the patient, >50% of the time counseling, and performing a MMSE

## 2012-05-26 NOTE — Assessment & Plan Note (Signed)
I asked the patient to come today to assess his memory, see history of present illness. MMSE 28 which is   mild memory impairment. We talk about medication, the potential for slow down the memory loss discussed. At this point he's not interested. We'll reassess the situation from time to time. Fortunately, the patient is cognitive to his slightly decreasing memory.

## 2012-05-28 ENCOUNTER — Telehealth: Payer: Self-pay | Admitting: Internal Medicine

## 2012-05-28 NOTE — Telephone Encounter (Signed)
Patient Information:  Caller Name: [employer]  Phone: [phone]  Patient: Christian Erickson, Christian Erickson  Gender: Male  DOB: 30-Jan-1931  Age: 78 Years  PCP: Willow Ora   Symptoms  Reason For Call & Symptoms: rash/hives with new cough/clearing of throat and lip swelling  Reviewed Health History In EMR: Yes  Reviewed Medications In EMR: Yes  Reviewed Allergies In EMR: Yes  Date of Onset of Symptoms: 05/28/2012  Guideline(s) Used:  Hives  Disposition Per Guideline:   See Today in Office  Reason For Disposition Reached:   Abdominal pain  Advice Given:  N/A  Office Follow Up:  Does the office need to follow up with this patient?: Yes  Instructions For The Office: INfo to office for staff management/workin appt krs/can  RN Note:  New Rx samples given by Dr. Drue Novel; has not taken any of the onglyza; no other new medications.  c/o abdominal pain.  Advised per hives protocol; emergent symptoms denied.  No appts available in system for 05/28/12; info to office for staff management.  May reach patient/spouse at [phone].  krs/can

## 2012-05-28 NOTE — Telephone Encounter (Signed)
Please advise 

## 2012-05-28 NOTE — Telephone Encounter (Signed)
Agree, please also tell them not to start onglyza  until next week; if symptoms persist-- needs a office visit

## 2012-05-28 NOTE — Telephone Encounter (Signed)
Called pt back, unable to leave a msg.  

## 2012-05-28 NOTE — Telephone Encounter (Signed)
Please call patient, clarify symptoms

## 2012-05-28 NOTE — Telephone Encounter (Signed)
Pt's wife stated that the pt started itching all over his body, & had rash on his arms & back. Pt's mouth started to swell, coughing, & trouble swallowing. Pt's wife said she gave the pt 2 allergy pills & the pt is doing much better now. I advised the pt's wife if they pt is not better to take him to urgent care. She stated that she understood.

## 2012-05-31 ENCOUNTER — Other Ambulatory Visit: Payer: Self-pay | Admitting: Internal Medicine

## 2012-05-31 MED ORDER — VARDENAFIL HCL 10 MG PO TABS: 10.0000 mg | ORAL_TABLET | Freq: Every day | ORAL | Status: DC | PRN

## 2012-05-31 NOTE — Telephone Encounter (Signed)
He is not on verapamil or TriCor that I know. Okay to refill Levitra 10 mg #5, 3 refills

## 2012-05-31 NOTE — Telephone Encounter (Signed)
Refill done.  

## 2012-05-31 NOTE — Telephone Encounter (Signed)
Pt is requesting a refill for vardinafil 10mg . OK to refill?

## 2012-05-31 NOTE — Telephone Encounter (Signed)
LM ON TRIAGE LINE 152pm needs refill of verapamil -- uses [facility]  cb# [phone]   Not on current meds listing

## 2012-06-08 ENCOUNTER — Telehealth: Payer: Self-pay | Admitting: *Deleted

## 2012-06-08 NOTE — Telephone Encounter (Signed)
Pt is no longer taking levitra due to [facility]. Pt is now taking viagra 100mg  1/2 to 1 tablet daily as needed.

## 2012-06-10 ENCOUNTER — Telehealth: Payer: Self-pay

## 2012-06-10 MED ORDER — GLUCOSE BLOOD VI STRP: ORAL_STRIP | Status: DC

## 2012-06-10 MED ORDER — ONETOUCH LANCETS MISC: Status: DC

## 2012-06-10 NOTE — Telephone Encounter (Signed)
Spoke with pt. He states about 2 weeks ago he went to urgent care about his allergic reaction he was having & the Dr prescribed him Prednisone 10mg  take 4 tab po every morning. The pt just wanted to let you know & to make sure it was ok for him to take this. Please advise.

## 2012-06-10 NOTE — Addendum Note (Signed)
Addended by: Edwena Felty T on: 06/10/2012 01:51 PM   Modules accepted: Orders

## 2012-06-10 NOTE — Telephone Encounter (Signed)
Di[facility] supplies [facility]  Prednisone

## 2012-06-10 NOTE — Telephone Encounter (Signed)
Pt LMOVM requesting CB from [place]. Pt stated wanted to discuss Prednisone. Plz advise    MW WU#[phone]

## 2012-06-10 NOTE — Telephone Encounter (Signed)
Lillia Abed, please call the patient

## 2012-06-10 NOTE — Telephone Encounter (Signed)
If he already took the prednisone and feels well then, nothing needs to be done. If he has not been taking prednisone and he does not longer has any allergy then don't  take prednisone.

## 2012-06-11 NOTE — Telephone Encounter (Signed)
Discussed with pt

## 2012-06-11 NOTE — Telephone Encounter (Signed)
Called pt, unable to leave a msg.   

## 2012-06-23 ENCOUNTER — Encounter (HOSPITAL_BASED_OUTPATIENT_CLINIC_OR_DEPARTMENT_OTHER): Payer: Self-pay | Admitting: Emergency Medicine

## 2012-06-23 ENCOUNTER — Emergency Department (HOSPITAL_BASED_OUTPATIENT_CLINIC_OR_DEPARTMENT_OTHER)
Admission: EM | Admit: 2012-06-23 | Discharge: 2012-06-23 | Disposition: A | Discharge status: Home / Self Care | Payer: Medicare Other | Attending: Emergency Medicine | Admitting: Emergency Medicine

## 2012-06-23 ENCOUNTER — Telehealth: Payer: Self-pay | Admitting: *Deleted

## 2012-06-23 DIAGNOSIS — H409 Unspecified glaucoma: Secondary | ICD-10-CM | POA: Insufficient documentation

## 2012-06-23 DIAGNOSIS — I10 Essential (primary) hypertension: Secondary | ICD-10-CM | POA: Insufficient documentation

## 2012-06-23 DIAGNOSIS — Z888 Allergy status to other drugs, medicaments and biological substances status: Secondary | ICD-10-CM | POA: Insufficient documentation

## 2012-06-23 DIAGNOSIS — J309 Allergic rhinitis, unspecified: Secondary | ICD-10-CM | POA: Insufficient documentation

## 2012-06-23 DIAGNOSIS — R51 Headache: Secondary | ICD-10-CM | POA: Insufficient documentation

## 2012-06-23 DIAGNOSIS — E119 Type 2 diabetes mellitus without complications: Secondary | ICD-10-CM | POA: Insufficient documentation

## 2012-06-23 DIAGNOSIS — E785 Hyperlipidemia, unspecified: Secondary | ICD-10-CM | POA: Insufficient documentation

## 2012-06-23 DIAGNOSIS — Z9889 Other specified postprocedural states: Secondary | ICD-10-CM | POA: Insufficient documentation

## 2012-06-23 DIAGNOSIS — Z8719 Personal history of other diseases of the digestive system: Secondary | ICD-10-CM | POA: Insufficient documentation

## 2012-06-23 DIAGNOSIS — Z87448 Personal history of other diseases of urinary system: Secondary | ICD-10-CM | POA: Insufficient documentation

## 2012-06-23 DIAGNOSIS — Z7982 Long term (current) use of aspirin: Secondary | ICD-10-CM | POA: Insufficient documentation

## 2012-06-23 DIAGNOSIS — Z79899 Other long term (current) drug therapy: Secondary | ICD-10-CM | POA: Insufficient documentation

## 2012-06-23 DIAGNOSIS — N400 Enlarged prostate without lower urinary tract symptoms: Secondary | ICD-10-CM | POA: Insufficient documentation

## 2012-06-23 LAB — TROPONIN I: Troponin I: <0.3 ng/mL (ref <0.30)

## 2012-06-23 LAB — CBC
MCH: 29.9 pg (ref 26.0–34.0)
MCHC: 34.7 g/dL (ref 30.0–36.0)
Platelets: 126 10*3/uL — ABNORMAL LOW (ref 150–400)
RDW: 14.3 % (ref 11.5–15.5)

## 2012-06-23 LAB — BASIC METABOLIC PANEL
Calcium: 9.4 mg/dL (ref 8.4–10.5)
GFR calc Af Amer: 65 mL/min — ABNORMAL LOW (ref >90)
GFR calc non Af Amer: 56 mL/min — ABNORMAL LOW (ref >90)
Glucose, Bld: 216 mg/dL — ABNORMAL HIGH (ref 70–99)
Potassium: 3.8 mEq/L (ref 3.5–5.1)
Sodium: 140 mEq/L (ref 135–145)

## 2012-06-23 MED ORDER — AMLODIPINE BESYLATE 5 MG PO TABS
5.0000 mg | ORAL_TABLET | Freq: Once | ORAL | Status: AC
  Administered 2012-06-23: 5 mg via ORAL
  Filled 2012-06-23: qty 1

## 2012-06-23 MED ORDER — AMLODIPINE BESYLATE 5 MG PO TABS: 5.0000 mg | ORAL_TABLET | Freq: Every day | ORAL | Status: DC

## 2012-06-23 NOTE — Telephone Encounter (Signed)
With the ER physician, he reports that the patient looks well, BP 165/78, he is asymptomatic. Will agree to on start amlodipine 5 mg daily. He will send a prescription.  Lillia Abed, please call the patient later  today and schedule him for a checkup on Friday, overbook okay

## 2012-06-23 NOTE — ED Notes (Addendum)
Pt having increased blood pressure, headache and dizziness x 2 days.  Denies chest pain, denies SOB.  Pt's BP meds were changed in November due to angioedema.  Pt was on enalapril and was placed on Cardura.  Pt found out that his BP was elevated at dermatologist appointment several days ago.

## 2012-06-23 NOTE — ED Provider Notes (Signed)
History     CSN: [id number]  Arrival date & time 06/23/12  1228   First MD Initiated Contact with Patient 06/23/12 1326      Chief Complaint  Patient presents with  . Hypertension    (Consider location/radiation/quality/duration/timing/severity/associated sxs/prior treatment) HPI Pt with several days of increased BP. Pt took bp last evening and systolic >200. States he had mild HA at that time. Questioning whether he may have missed a dose or 2. Currently asymptomatic. No Ha, Cp, N/V, focal weakness, or sensory changes. Called to his PMD, Dr Drue Novel office for visit and was told to come to ED.  Past Medical History  Diagnosis Date  . BPH (benign prostatic hypertrophy)   . Diabetes mellitus   . Hypertension   . Hyperlipidemia   . Diverticulitis   . ED (erectile dysfunction)   . Glaucoma(365)     sees eye doctor routinely  . Allergic rhinitis     Past Surgical History  Procedure Date  . Inguinal hernia repair   . Colectomy     w/ reversal due to divertiuclitis per patient in 1990s aprox  . Sbo repair     Family History  Problem Relation Age of Onset  . Lupus    . Stroke Brother 24    CVA, no FH premature CAD  . Alzheimer's disease    . Diabetes      granddaughter    History  Substance Use Topics  . Smoking status: Never Smoker   . Smokeless tobacco: Not on file  . Alcohol Use: Not on file      Review of Systems  Constitutional: Negative for fever and chills.  HENT: Negative for neck pain and neck stiffness.   Eyes: Negative for photophobia and visual disturbance.  Respiratory: Negative for cough, chest tightness and shortness of breath.   Cardiovascular: Negative for chest pain and palpitations.  Gastrointestinal: Negative for nausea, vomiting and abdominal pain.  Musculoskeletal: Negative for back pain and joint swelling.  Skin: Negative for rash and wound.  Neurological: Positive for headaches. Negative for dizziness, seizures, syncope, weakness and  numbness.    Allergies  Vasotec  Home Medications   Current Outpatient Rx  Name  Route  Sig  Dispense  Refill  . SAXAGLIPTIN HCL 2.5 MG PO TABS   Oral   Take 5 mg by mouth daily.         [name] AMLODIPINE BESYLATE 5 MG PO TABS   Oral   Take 1 tablet (5 mg total) by mouth daily.   30 tablet   0   . ASPIRIN 81 MG PO TABS   Oral   Take 81 mg by mouth daily.           . ATORVASTATIN CALCIUM 20 MG PO TABS   Oral   Take 1 tablet (20 mg total) by mouth daily.   90 tablet   1   . BIMATOPROST 0.03 % OP SOLN      1 drop at bedtime.           [name] DOXAZOSIN MESYLATE 2 MG PO TABS   Oral   Take 1 tablet (2 mg total) by mouth at bedtime.   30 tablet   0   . FLUTICASONE PROPIONATE 50 MCG/ACT NA SUSP   Nasal   Place 2 sprays into the nose daily.           [name] GLUCOSE BLOOD VI STRP      Use as instructed  100 each   12     Check once daily.  Dx:250.00  One touch ultra   . METFORMIN HCL 1000 MG PO TABS   Oral   Take 1 tablet (1,000 mg total) by mouth 2 (two) times daily with a meal.   180 tablet   1   . MULTIVITAMINS PO TABS   Oral   Take 1 tablet by mouth daily.           Letta Pate LANCETS MISC      Check once daily. Dx:250.00  One touch ultra   200 each   12   . TAMSULOSIN HCL 0.4 MG PO CAPS   Oral   Take 1 capsule (0.4 mg total) by mouth daily.   30 capsule   3   . VARDENAFIL HCL 10 MG PO TABS   Oral   Take 1 tablet (10 mg total) by mouth daily as needed.   5 tablet   3     BP 185/76  Pulse 85  Temp 97.6 F (36.4 C) (Oral)  Resp 16  SpO2 100%  Physical Exam  Nursing note and vitals reviewed. Constitutional: He is oriented to person, place, and time. He appears well-developed and well-nourished. No distress.  HENT:  Head: Normocephalic and atraumatic.  Mouth/Throat: Oropharynx is clear and moist.  Eyes: EOM are normal. Pupils are equal, round, and reactive to light.  Neck: Normal range of motion. Neck supple.  Cardiovascular:  Normal rate and regular rhythm.   Pulmonary/Chest: Effort normal and breath sounds normal. No respiratory distress. He has no wheezes. He has no rales.  Abdominal: Soft. Bowel sounds are normal. He exhibits no mass. There is no tenderness. There is no rebound and no guarding.  Musculoskeletal: Normal range of motion. He exhibits no edema and no tenderness.  Neurological: He is alert and oriented to person, place, and time.       5/5 motor in all ext, sensation intact.   Skin: Skin is warm and dry. No rash noted. No erythema.  Psychiatric: He has a normal mood and affect. His behavior is normal.    ED Course  Procedures (including critical care time)  Labs Reviewed  CBC - Abnormal; Notable for the following:    Platelets 126 (*)     All other components within normal limits  BASIC METABOLIC PANEL - Abnormal; Notable for the following:    Glucose, Bld 216 (*)     GFR calc non Af Amer 56 (*)     GFR calc Af Amer 65 (*)     All other components within normal limits  TROPONIN I   No results found.   1. Uncontrolled hypertension      Date: 06/23/2012  Rate: 69  Rhythm: normal sinus rhythm  QRS Axis: normal  Intervals: normal  ST/T Wave abnormalities: normal  Conduction Disutrbances:none  Narrative Interpretation:   Old EKG Reviewed: unchanged    MDM  Discussed with pt's PMD, Dr Drue Novel. Suggested starting Amlodipine 5 mg q days and will f/u in 1-2 days in office        Loren Racer, MD 06/23/12 [phone]

## 2012-06-23 NOTE — Telephone Encounter (Signed)
Pt called stating that his BP readings have been elevated 178/90 and 186/92 & is having headaches. Advised pt he needs to go to urgent care/ER. Pt understood & stated that he was going.

## 2012-06-23 NOTE — ED Notes (Signed)
MD at bedside. 

## 2012-06-23 NOTE — Telephone Encounter (Signed)
Unfortunately no appointments available at this point

## 2012-06-23 NOTE — Telephone Encounter (Signed)
Spoke with pt, scheduled appt Friday @ 330p.

## 2012-06-25 ENCOUNTER — Ambulatory Visit (INDEPENDENT_AMBULATORY_CARE_PROVIDER_SITE_OTHER): Payer: Medicare Other | Admitting: Internal Medicine

## 2012-06-25 VITALS — BP 174/82 | HR 73 | Temp 98.1°F | Wt 168.0 lb

## 2012-06-25 DIAGNOSIS — I10 Essential (primary) hypertension: Secondary | ICD-10-CM

## 2012-06-25 DIAGNOSIS — E119 Type 2 diabetes mellitus without complications: Secondary | ICD-10-CM

## 2012-06-25 DIAGNOSIS — D696 Thrombocytopenia, unspecified: Secondary | ICD-10-CM

## 2012-06-25 NOTE — Patient Instructions (Addendum)
Take the medications as prescribed below. Watch your salt intake Check the  blood pressure 2 or 3 times a week, be sure it is between 110/60 and 140/85. If it is consistently higher or lower, let me know Come back for a checkup in one month. Call anytime if you have more swelling in your legs.

## 2012-06-25 NOTE — Assessment & Plan Note (Addendum)
BP elevated recently, status post ER eval, labs were stable. No obvious triggers for elevated BP. Started  amlodipine, he seems to be tolerating well, may take few more weeks for the amlodipine to fully kick in. So far no increase in his lower extremity edema Plan:  Continue amlodipine, watch edema, consider increase Cardura  see instructions.

## 2012-06-25 NOTE — Assessment & Plan Note (Signed)
Chronic but stable thrombocytopenia, never been formally eval by hematology. Consider referral at some point

## 2012-06-25 NOTE — Assessment & Plan Note (Addendum)
Taking only one onglyza a day, needs to be taking 2 tablets daily. Patient aware. Wonders if he could skip the second of metformin in the  Afternoon if his  CBG is < 100: ok

## 2012-06-25 NOTE — Progress Notes (Signed)
  Subjective:    Patient ID: Christian Erickson, male    DOB: 1931/06/01, 78 y.o.   MRN: [id number]  HPI ER followup Went to the ER 2 days ago because his BP was elevated. Prior to the ER evaluation BP was 168/110 and at some point was as high as  200/90. At the ER: troponin was negative, potassium 3.8, CBC normal except for a low platelet count. EKG at baseline. I spoke with the ER doctor,we agreed to start amlodipine 5 mg  Past Medical History: BPH Diabetes Hypertension hyperlipidemia Diverticulitis, hx of ED Glaucoma , sees eye doctor routinely   Allergic rhinitis  Past Surgical History: Inguinal herniorrhaphy Colectomy with reversal due to diverticulitis per patient (in 1990s aprox) SBO repair  Social History: Married x 44 years, children x 2 , 2 G daughters Wife Anthonette [phone] Retired-- Hotel manager, [employer] Tobacco-- rarely many years ago   ETOH-- socially   Family History CAD-- several family  Members (in their 45s) DM-- cousin Strokes -- brothers x 2 Prostate ca-- no Colon ca-- no   Review of Systems Since he left the ER, reports good compliance with BP meds. Still has a very mild headache. Denies any chest pain, shortness of breath. Lower extremity edema is at baseline. Denies any increase in sodium intake, and not taking NSAIDs. As far as  diabetes medication, he is only taking one onglyza(was supposed to be taking 2)    Objective:   Physical Exam General -- alert, well-developed, and overweight appearing. Lungs -- normal respiratory effort, no intercostal retractions, no accessory muscle use, and normal breath sounds.   Heart-- normal rate, regular rhythm, no murmur, and no gallop.   Extremities-- edema L>R , ar baseline  Neurologic-- alert & oriented X3 and strength normal in all extremities. Psych--  not anxious appearing and not depressed appearing.       Assessment & Plan:

## 2012-06-27 ENCOUNTER — Encounter: Payer: Self-pay | Admitting: Internal Medicine

## 2012-07-02 ENCOUNTER — Ambulatory Visit: Payer: Medicare Other | Admitting: Internal Medicine

## 2012-07-14 ENCOUNTER — Other Ambulatory Visit: Payer: Self-pay | Admitting: *Deleted

## 2012-07-14 DIAGNOSIS — I10 Essential (primary) hypertension: Secondary | ICD-10-CM

## 2012-07-14 DIAGNOSIS — E119 Type 2 diabetes mellitus without complications: Secondary | ICD-10-CM

## 2012-07-14 MED ORDER — AMLODIPINE BESYLATE 5 MG PO TABS: 5.0000 mg | ORAL_TABLET | Freq: Every day | ORAL | Status: DC

## 2012-07-14 MED ORDER — SAXAGLIPTIN HCL 2.5 MG PO TABS: 5.0000 mg | ORAL_TABLET | Freq: Every day | ORAL | Status: DC

## 2012-07-14 NOTE — Telephone Encounter (Signed)
Refill x 3 week supply for Onglyzia and Norvasc sent to [facility] per pt request. Additional refills sent to [facility] for 90 day supply.

## 2012-07-19 ENCOUNTER — Other Ambulatory Visit (INDEPENDENT_AMBULATORY_CARE_PROVIDER_SITE_OTHER): Payer: Medicare Other

## 2012-07-19 DIAGNOSIS — E039 Hypothyroidism, unspecified: Secondary | ICD-10-CM

## 2012-07-20 LAB — TSH: TSH: 5.74 u[IU]/mL — ABNORMAL HIGH (ref 0.35–5.50)

## 2012-07-21 ENCOUNTER — Encounter: Payer: Self-pay | Admitting: Internal Medicine

## 2012-07-21 ENCOUNTER — Ambulatory Visit (INDEPENDENT_AMBULATORY_CARE_PROVIDER_SITE_OTHER): Payer: Medicare Other | Admitting: Internal Medicine

## 2012-07-21 VITALS — BP 142/78 | HR 93 | Temp 98.2°F | Wt 169.0 lb

## 2012-07-21 DIAGNOSIS — T783XXA Angioneurotic edema, initial encounter: Secondary | ICD-10-CM

## 2012-07-21 DIAGNOSIS — M1990 Unspecified osteoarthritis, unspecified site: Secondary | ICD-10-CM

## 2012-07-21 DIAGNOSIS — R609 Edema, unspecified: Secondary | ICD-10-CM

## 2012-07-21 DIAGNOSIS — R7989 Other specified abnormal findings of blood chemistry: Secondary | ICD-10-CM

## 2012-07-21 DIAGNOSIS — E039 Hypothyroidism, unspecified: Secondary | ICD-10-CM

## 2012-07-21 DIAGNOSIS — E038 Other specified hypothyroidism: Secondary | ICD-10-CM

## 2012-07-21 DIAGNOSIS — E119 Type 2 diabetes mellitus without complications: Secondary | ICD-10-CM

## 2012-07-21 DIAGNOSIS — I10 Essential (primary) hypertension: Secondary | ICD-10-CM

## 2012-07-21 MED ORDER — SAXAGLIPTIN HCL 2.5 MG PO TABS: 5.0000 mg | ORAL_TABLET | Freq: Every day | ORAL | Status: DC

## 2012-07-21 NOTE — Progress Notes (Signed)
  Subjective:    Patient ID: Christian Erickson, male    DOB: 1930/12/19, 79 y.o.   MRN: [id number]  HPI ROV. Today we discussed the following DM- good med  compliance, ambulatory blood sugars around 109 HTN- good medication compliance, ambulatory BPs in the 140s. Diastolic BP? DJD-- status post eval by Dr. Eulah Pont, after 3 shots, he's not getting better, planning surgery for April. Allergy-- history of angioedema, allergy note  reviewed. Subclinical hypothyroidism-- results discussed with the patient.  Past Medical History  Diagnosis Date  . BPH (benign prostatic hypertrophy)   . Diabetes mellitus   . Hypertension   . Hyperlipidemia   . Diverticulitis   . ED (erectile dysfunction)   . Glaucoma(365)     sees eye doctor routinely  . Allergic rhinitis    Past Surgical History  Procedure Date  . Inguinal hernia repair   . Colectomy     w/ reversal due to divertiuclitis per patient in 1990s aprox  . Sbo repair    Review of Systems Denies chest pain or shortness or breath No weight increase, no depression. Diet has improved, he is allergic to meat according to some testing and is eating more fish and chicken. No nausea, vomiting, diarrhea. Edema is at baseline.    Objective:   Physical Exam General -- alert, well-developed, and overweight appearing. Lungs -- normal respiratory effort, no intercostal retractions, no accessory muscle use, and normal breath sounds.    Heart-- normal rate, regular rhythm, no murmur, and no gallop.   Extremities-- edema L>R , ar baseline   Neurologic-- alert & oriented X3 and strength normal in all extremities. Psych--  not anxious appearing and not depressed appearing.       Assessment & Plan:

## 2012-07-21 NOTE — Assessment & Plan Note (Signed)
Patient reports they're planning a right total knee replacement for April 2014, Dr. Eulah Pont.

## 2012-07-21 NOTE — Assessment & Plan Note (Addendum)
He started amlodipine 5 mg last month,  ambulatory BPs around 140. Edema seems at baseline Plan:  No change for now

## 2012-07-21 NOTE — Assessment & Plan Note (Signed)
Status post eval by allergy, after some testing he was eventually diagnosed with allergic to meat. Doing a chicken and fish diet. Has EpiPen.

## 2012-07-21 NOTE — Assessment & Plan Note (Signed)
At baseline 

## 2012-07-21 NOTE — Assessment & Plan Note (Signed)
Tolerating metformin and onglysa well. Check A1c.

## 2012-07-21 NOTE — Assessment & Plan Note (Signed)
Last TSH remained slightly elevated, treatment is controversial. Plan: Observation for now, recheck in 6-8 months

## 2012-07-25 ENCOUNTER — Other Ambulatory Visit: Payer: Self-pay | Admitting: Internal Medicine

## 2012-07-26 NOTE — Telephone Encounter (Signed)
Refill done.  

## 2012-07-27 ENCOUNTER — Other Ambulatory Visit: Payer: Self-pay | Admitting: *Deleted

## 2012-07-27 DIAGNOSIS — E119 Type 2 diabetes mellitus without complications: Secondary | ICD-10-CM

## 2012-07-27 MED ORDER — SAXAGLIPTIN HCL 2.5 MG PO TABS: 5.0000 mg | ORAL_TABLET | Freq: Every day | ORAL | Status: DC

## 2012-07-31 ENCOUNTER — Telehealth: Payer: Self-pay | Admitting: Internal Medicine

## 2012-07-31 NOTE — Telephone Encounter (Signed)
Needs labs ordered at the last visit, please ask pt to RTC for labs

## 2012-08-02 NOTE — Telephone Encounter (Signed)
Called pt on [facility] LMOM to call and schedule lab appt

## 2012-08-03 NOTE — Telephone Encounter (Signed)
appt scheduled for 1.31.14

## 2012-08-06 ENCOUNTER — Other Ambulatory Visit: Payer: Medicare Other

## 2012-08-09 ENCOUNTER — Other Ambulatory Visit (INDEPENDENT_AMBULATORY_CARE_PROVIDER_SITE_OTHER): Payer: Medicare Other

## 2012-08-09 DIAGNOSIS — I10 Essential (primary) hypertension: Secondary | ICD-10-CM

## 2012-08-09 LAB — BASIC METABOLIC PANEL
BUN: 17 mg/dL (ref 6–23)
Creatinine, Ser: 1.2 mg/dL (ref 0.4–1.5)
GFR: 77.94 mL/min (ref >60.00)
Glucose, Bld: 160 mg/dL — ABNORMAL HIGH (ref 70–99)
Potassium: 3.8 mEq/L (ref 3.5–5.1)

## 2012-08-31 ENCOUNTER — Other Ambulatory Visit: Payer: Self-pay | Admitting: Internal Medicine

## 2012-08-31 NOTE — Telephone Encounter (Signed)
Refill done.  

## 2012-09-07 ENCOUNTER — Encounter: Payer: Self-pay | Admitting: Internal Medicine

## 2012-09-20 ENCOUNTER — Ambulatory Visit (INDEPENDENT_AMBULATORY_CARE_PROVIDER_SITE_OTHER): Payer: Medicare Other | Admitting: Internal Medicine

## 2012-09-20 ENCOUNTER — Encounter: Payer: Self-pay | Admitting: Internal Medicine

## 2012-09-20 VITALS — BP 132/70 | HR 72 | Temp 98.2°F | Wt 165.0 lb

## 2012-09-20 DIAGNOSIS — I10 Essential (primary) hypertension: Secondary | ICD-10-CM

## 2012-09-20 DIAGNOSIS — E119 Type 2 diabetes mellitus without complications: Secondary | ICD-10-CM

## 2012-09-20 DIAGNOSIS — R609 Edema, unspecified: Secondary | ICD-10-CM

## 2012-09-20 LAB — CBC WITH DIFFERENTIAL/PLATELET
Basophils Absolute: 0 10*3/uL (ref 0.0–0.1)
Eosinophils Relative: 5.4 % — ABNORMAL HIGH (ref 0.0–5.0)
HCT: 43.3 % (ref 39.0–52.0)
Lymphocytes Relative: 27.9 % (ref 12.0–46.0)
Lymphs Abs: 1.1 10*3/uL (ref 0.7–4.0)
Monocytes Relative: 8.7 % (ref 3.0–12.0)
Platelets: 142 10*3/uL — ABNORMAL LOW (ref 150.0–400.0)
RBC: 4.89 Mil/uL (ref 4.22–5.81)

## 2012-09-20 NOTE — Progress Notes (Signed)
  Subjective:    Patient ID: Christian Erickson, male    DOB: July 02, 1931, 79 y.o.   MRN: [id number]  HPI Here for a preop evaluation: DJD, needs a total knee replacement. Needs surgical clearance, see review of systems assessment and plan Diabetes, doing very well with  Metformin BID, still on onglyza 1 qd, ambulatory blood sugars around 110, 115. Has not seen CBGs in the low side such as 70-80. Hypertension, BP today is very good, reports normal ambulatory BPs.  Past Medical History  Diagnosis Date  . BPH (benign prostatic hypertrophy)   . Diabetes mellitus   . Hypertension   . Hyperlipidemia   . Diverticulitis   . ED (erectile dysfunction)   . Glaucoma(365)     sees eye doctor routinely  . Allergic rhinitis   . Subclinical hypothyroidism    Past Surgical History  Procedure Laterality Date  . Inguinal hernia repair    . Colectomy      w/ reversal due to divertiuclitis per patient in 1990s aprox  . Sbo repair      Review of Systems Denies chest pain or shortness or breath. No orthopnea. Denies any snoring or feeling sleepy throughout the day. He is active, does yard work and home chores, goes up and down stairs without problems other than knee pain. he continue with some lower extremity edema, mostly on the left.    Objective:   Physical Exam BP 132/70  Pulse 72  Temp(Src) 98.2 F (36.8 C) (Oral)  Wt 165 lb (74.844 kg)  BMI 27.46 kg/m2  General -- alert, well-developed, BMI .   Neck --no thyromegaly , normal carotid pulse, no JVD at 45 Lungs -- normal respiratory effort, no intercostal retractions, no accessory muscle use, and normal breath sounds.   Heart-- normal rate, regular rhythm, no murmur, and no gallop.   Abdomen--soft, non-tender, no distention  Extremities--  soft pitting edema, +/+++ R, ++/+++ L Neurologic-- alert & oriented X3 and strength normal in all extremities. Psych-- Cognition and judgment appear intact. Alert and cooperative with normal attention  span and concentration.  not anxious appearing and not depressed appearing.      Assessment & Plan:

## 2012-09-20 NOTE — Patient Instructions (Addendum)
We'll schedule a stress test and a visit to the hematologist (blood doctor) Please come back in 3 or 4 months for a checkup.

## 2012-09-20 NOTE — Assessment & Plan Note (Signed)
Chronic, stable thrombocytopenia, now facing major surgery. Plan: Hematology referral.

## 2012-09-20 NOTE — Assessment & Plan Note (Addendum)
Last A1c over 7, the patient reported he wasn't taking the medication twice a day as recommended. Currently with better compliance w/ metformin, still on onglyza 1 qd, ambulatory blood sugars are great at around 110. Plan -- recheck the A1c in few months. onglyza bid

## 2012-09-20 NOTE — Assessment & Plan Note (Addendum)
Chronic, asymmetric edema. Ultrasound 01/2012 negative for left leg DVT. Edema was present before he is started on amlodipine, nevertheless consider discontinue amlodipine if edema increases

## 2012-09-20 NOTE — Assessment & Plan Note (Signed)
79 year old gentleman with diabetes and high blood pressure, asymptomatic from a cardiopulmonary standpoint, he remains active and has a good functional status. I think is important to rule out underlying CAD. EKG today showed sinus bradycardia, at baseline.. Plan: Stress test before clearance

## 2012-09-20 NOTE — Assessment & Plan Note (Signed)
Well-controlled at the present time, he does have some edema, at baseline, no change.

## 2012-09-29 ENCOUNTER — Ambulatory Visit (HOSPITAL_COMMUNITY): Payer: Medicare Other | Attending: Cardiology | Admitting: Radiology

## 2012-09-29 VITALS — Ht 66.5 in | Wt 168.0 lb

## 2012-09-29 DIAGNOSIS — Z8249 Family history of ischemic heart disease and other diseases of the circulatory system: Secondary | ICD-10-CM | POA: Insufficient documentation

## 2012-09-29 DIAGNOSIS — R5383 Other fatigue: Secondary | ICD-10-CM | POA: Insufficient documentation

## 2012-09-29 DIAGNOSIS — R5381 Other malaise: Secondary | ICD-10-CM | POA: Insufficient documentation

## 2012-09-29 DIAGNOSIS — E119 Type 2 diabetes mellitus without complications: Secondary | ICD-10-CM | POA: Insufficient documentation

## 2012-09-29 DIAGNOSIS — I10 Essential (primary) hypertension: Secondary | ICD-10-CM | POA: Insufficient documentation

## 2012-09-29 DIAGNOSIS — R42 Dizziness and giddiness: Secondary | ICD-10-CM | POA: Insufficient documentation

## 2012-09-29 DIAGNOSIS — R079 Chest pain, unspecified: Secondary | ICD-10-CM | POA: Insufficient documentation

## 2012-09-29 DIAGNOSIS — Z87891 Personal history of nicotine dependence: Secondary | ICD-10-CM | POA: Insufficient documentation

## 2012-09-29 DIAGNOSIS — Z01810 Encounter for preprocedural cardiovascular examination: Secondary | ICD-10-CM

## 2012-09-29 MED ORDER — REGADENOSON 0.4 MG/5ML IV SOLN
0.4000 mg | Freq: Once | INTRAVENOUS | Status: AC
  Administered 2012-09-29: 0.4 mg via INTRAVENOUS

## 2012-09-29 MED ORDER — TECHNETIUM TC 99M SESTAMIBI GENERIC - CARDIOLITE
10.0000 | Freq: Once | INTRAVENOUS | Status: AC | PRN
  Administered 2012-09-29: 10 via INTRAVENOUS

## 2012-09-29 MED ORDER — TECHNETIUM TC 99M SESTAMIBI GENERIC - CARDIOLITE
30.0000 | Freq: Once | INTRAVENOUS | Status: AC | PRN
  Administered 2012-09-29: 30 via INTRAVENOUS

## 2012-09-29 NOTE — Progress Notes (Signed)
[facility] SITE 3 NUCLEAR MED [name] [place], [place] [address] [phone]    Cardiology Nuclear Med Study  Christian Erickson is a 79 y.o. male     MRN : [id number]     DOB: 06/26/1931  Procedure Date: 09/29/2012  Nuclear Med Background Indication for Stress Test:  Evaluation for Ischemia and Surgical Clearance: Pre-op (R) TKR on 10-27-12 by Dr. Mckinley Jewel History: No prior known history of CAD, and 20 years ago GXT: Ok per patient Cardiac Risk Factors: History of Smoking, Hypertension, Lipids and NIDDM  Symptoms: Chest Pain without exertion (last occurrence one month ago),  Dizziness, Fatigue, Fatigue with Exertion, Light-Headedness and Rapid HR   Nuclear Pre-Procedure Caffeine/Decaff Intake:  None NPO After: 11:00pm   Lungs:  clear O2 Sat: 98% on room air. IV 0.9% NS with Angio Cath:  22g  IV Site: R Hand  IV Started by:  Bonnita Levan, RN  Chest Size (in):  42 Cup Size: n/a  Height: 5' 6.5" (1.689 m)  Weight:  168 lb (76.204 kg)  BMI:  Body mass index is 26.71 kg/(m^2). Tech Comments:  BS @ 12:15 pm =136    Nuclear Med Study 1 or 2 day study: 1 day  Stress Test Type:  Lexiscan  Reading MD: Willa Rough, MD  Order Authorizing Provider:  Willow Ora, MD  Resting Radionuclide: Technetium 54m Sestamibi  Resting Radionuclide Dose: 11.0 mCi   Stress Radionuclide:  Technetium 63m Sestamibi  Stress Radionuclide Dose: 33.0 mCi           Stress Protocol Rest HR: 60 Stress HR: 113  Rest BP: 146/70 Stress BP: 156/59  Exercise Time (min): n/a METS: n/a   Predicted Max HR: 140 bpm % Max HR: 80.71 bpm Rate Pressure Product: 21308   Dose of Adenosine (mg):  n/a Dose of Lexiscan: 0.4 mg  Dose of Atropine (mg): n/a Dose of Dobutamine: n/a mcg/kg/min (at max HR)  Stress Test Technologist: Irean Hong, RN  Nuclear Technologist:  Domenic Polite, CNMT     Rest Procedure:  Myocardial perfusion imaging was performed at rest 45 minutes following the intravenous  administration of Technetium 68m Sestamibi. Rest ECG: Sinus bradycardia with normal QRS  Stress Procedure:  The patient received IV Lexiscan 0.4 mg over 15-seconds.  Technetium 37m Sestamibi injected at 30-seconds. The patient complained of SOB, but denied chest pain. Quantitative spect images were obtained after a 45 minute delay. Stress ECG: No significant change from baseline ECG  QPS Raw Data Images:  Patient motion noted; appropriate software correction applied. Stress Images:  Normal homogeneous uptake in all areas of the myocardium. Rest Images:  Normal homogeneous uptake in all areas of the myocardium. Subtraction (SDS):  No evidence of ischemia. Transient Ischemic Dilatation (Normal <1.22):  0.94 Lung/Heart Ratio (Normal <0.45):  0.31  Quantitative Gated Spect Images QGS EDV:  67 ml QGS ESV:  18 ml  Impression Exercise Capacity:  Lexiscan with no exercise. BP Response:  Normal blood pressure response. Clinical Symptoms:  shortness of breath ECG Impression:  No significant ST segment change suggestive of ischemia. Comparison with Prior Nuclear Study: No previous nuclear study performed  Overall Impression:  Normal stress nuclear study.  This is a low risk scan. There is no significant abnormality.  LV Ejection Fraction: 73%.  LV Wall Motion:  Normal Wall Motion.  Willa Rough, MD

## 2012-10-01 ENCOUNTER — Telehealth: Payer: Self-pay | Admitting: Internal Medicine

## 2012-10-01 NOTE — Telephone Encounter (Signed)
Left detailed msg on pt's vmail.  

## 2012-10-01 NOTE — Telephone Encounter (Signed)
Advise pt, stress test neg, hematology eval pending before he is cleared for surgery

## 2012-10-11 ENCOUNTER — Ambulatory Visit: Payer: Medicare Other

## 2012-10-11 ENCOUNTER — Telehealth: Payer: Self-pay | Admitting: Internal Medicine

## 2012-10-11 ENCOUNTER — Ambulatory Visit (HOSPITAL_BASED_OUTPATIENT_CLINIC_OR_DEPARTMENT_OTHER): Payer: Medicare Other | Admitting: Medical

## 2012-10-11 ENCOUNTER — Ambulatory Visit: Payer: Medicare Other | Admitting: Internal Medicine

## 2012-10-11 ENCOUNTER — Ambulatory Visit (HOSPITAL_BASED_OUTPATIENT_CLINIC_OR_DEPARTMENT_OTHER): Payer: Medicare Other | Admitting: Lab

## 2012-10-11 VITALS — BP 156/64 | HR 70 | Temp 98.0°F | Resp 18 | Ht 65.0 in | Wt 165.0 lb

## 2012-10-11 DIAGNOSIS — D696 Thrombocytopenia, unspecified: Secondary | ICD-10-CM

## 2012-10-11 LAB — CBC WITH DIFFERENTIAL (CANCER CENTER ONLY)
BASO#: 0 10*3/uL (ref 0.0–0.2)
BASO%: 0.4 % (ref 0.0–2.0)
Eosinophils Absolute: 0.2 10*3/uL (ref 0.0–0.5)
HCT: 43.2 % (ref 38.7–49.9)
HGB: 14.7 g/dL (ref 13.0–17.1)
LYMPH#: 1.1 10*3/uL (ref 0.9–3.3)
LYMPH%: 22.9 % (ref 14.0–48.0)
MCV: 88 fL (ref 82–98)
MONO#: 0.5 10*3/uL (ref 0.1–0.9)
NEUT%: 62.1 % (ref 40.0–80.0)
RDW: 14.4 % (ref 11.1–15.7)
WBC: 4.7 10*3/uL (ref 4.0–10.0)

## 2012-10-11 NOTE — Progress Notes (Signed)
This office note has been dictated.

## 2012-10-11 NOTE — Telephone Encounter (Signed)
Note from hematology reviewed. Advise pt, he is cleared for surgery, please fax form to ortho

## 2012-10-11 NOTE — H&P (Signed)
[facility] NEW PATIENT EVALUATION   Name: Christian Erickson Date: 10/11/2012 MRN: [id number] DOB: [birth year]   REFERRING PHYSICIAN: Wanda Plump, MD  REASON FOR REFERRAL: Thrombocytopenia    HISTORY OF PRESENT ILLNESS:Christian Erickson is a 79 y.o. male who was kindly referred to our office for further evaluation and surgical clearance for thrombocytopenia.  This is a pleasant, gentleman, who has a history of diabetes, hyperlipidemia, hypertension, BPH, and diverticulitis.  He was recently seen by his primary care physician for preop evaluation.  [name] states, that he needs a right total knee replacement.  It was noted that his platelets were little bit on the lower side during his physical.  His platelet count back on March 17, was 142,000.  His platelets seem to fluctuate between 124,000 in 142,000.  His platelet count has been on the lower side of normal for quite some time now.  He has remained asymptomatic.  He's not reporting any melena, hematochezia, gum bleeding or nosebleeds.  He's not reporting any type of ecchymosis, or petechiae.  He does not report any history of cirrhosis.  He does not have any leukopenia or anemia.  This possibly could be a mild immune thrombocytopenia or medication related.  He does not report any excessive fatigue or weakness. He, reports, a good appetite.  He denies any unintentional weight loss.  He denies any nausea, vomiting, diarrhea, constipation, chest pain, shortness of breath, or cough.  He denies any fevers, chills, or night sweats.  He denies any palpable lymph glands.  He denies any abdominal pain.  He denies any headaches, visual changes, or rashes.   PAST MEDICAL HISTORY:  has a past medical history of BPH (benign prostatic hypertrophy); Diabetes mellitus; Hypertension; Hyperlipidemia; Diverticulitis; ED (erectile dysfunction); Glaucoma(365); Allergic rhinitis; and Subclinical hypothyroidism.     PAST SURGICAL HISTORY: Past Surgical History   Procedure Laterality Date  . Inguinal hernia repair    . Colectomy      w/ reversal due to divertiuclitis per patient in 1990s aprox  . Sbo repair     Current Outpatient Prescriptions on File Prior to Visit  Medication Sig Dispense Refill  . amLODipine (NORVASC) 5 MG tablet take 1 tablet by mouth once daily  21 tablet  5  . aspirin 81 MG tablet Take 81 mg by mouth daily.        [name] atorvastatin (LIPITOR) 20 MG tablet Take 1 tablet (20 mg total) by mouth daily.  90 tablet  1  . bimatoprost (LUMIGAN) 0.03 % ophthalmic solution 1 drop at bedtime.        [name] doxazosin (CARDURA) 2 MG tablet take 1 tablet by mouth at bedtime  30 tablet  5  . fluticasone (FLONASE) 50 MCG/ACT nasal spray Place 2 sprays into the nose as needed.       . metFORMIN (GLUCOPHAGE) 1000 MG tablet Take 1 tablet (1,000 mg total) by mouth 2 (two) times daily with a meal.  180 tablet  1  . multivitamin (THERAGRAN) per tablet Take 1 tablet by mouth daily.        . ONE TOUCH LANCETS MISC Check once daily. Dx:250.00  One touch ultra  200 each  12  . saxagliptin HCl (ONGLYZA) 2.5 MG TABS tablet Take 2 tablets (5 mg total) by mouth daily. Two tablets together once a day  180 tablet  1  . Tamsulosin HCl (FLOMAX) 0.4 MG CAPS Take 1 capsule (0.4 mg total) by mouth daily.  30  capsule  3  . [DISCONTINUED] cloNIDine (CATAPRES) 0.2 MG tablet Take 0.2 mg by mouth 2 (two) times daily.       No current facility-administered medications on file prior to visit.    ALLERGIES: Vasotec   SOCIAL HISTORY:  reports that he has never smoked. He does not have any smokeless tobacco history on file.   FAMILY HISTORY: family history includes Alzheimer's disease in an unspecified family member; Diabetes in an unspecified family member; Lupus in an unspecified family member; and Stroke (age of onset: 42) in his brother.  Laboratory Data:  White count 4.7, hemoglobin 14.7, hematocrit 43.2, platelets 126,000  Peripheral smear  shows a well defined  population of red blood cells. There is no  polychromasia. I do not see any nucleated red blood cells. There are  no schistocytes. He had no rouleaux formation. I saw no target cells.  There are no sickle cells. He does have some lymphocytes. She has no hypersegmented polys. There are no immature  myeloid or lymphoid forms.  I do not see any blasts.  He does have a few large platelets.  There are some smudge cells.  Review of Systems: Constitutional:Negative for malaise/fatigue, fever, chills, weight loss, diaphoresis, activity change, appetite change, and unexpected weight change.  HEENT: Negative for double vision, blurred vision, visual loss, ear pain, tinnitus, congestion, rhinorrhea, epistaxis sore throat or sinus disease, oral pain/lesion, tongue soreness Respiratory: Negative for cough, chest tightness, shortness of breath, wheezing and stridor.  Cardiovascular: Negative for chest pain, palpitations, leg swelling, orthopnea, PND, DOE or claudication Gastrointestinal: Negative for nausea, vomiting, abdominal pain, diarrhea, constipation, blood in stool, melena, hematochezia, abdominal distention, anal bleeding, rectal pain, anorexia and hematemesis.  Genitourinary: Negative for dysuria, frequency, hematuria,  Musculoskeletal: Negative for myalgias, back pain, joint swelling, arthralgias and gait problem.  Skin: Negative for rash, color change, pallor and wound.  Neurological:. Negative for dizziness/light-headedness, tremors, seizures, syncope, facial asymmetry, speech difficulty, weakness, numbness, headaches and paresthesias.  Hematological: Negative for adenopathy. Does not bruise/bleed easily.  Psychiatric/Behavioral:  Negative for depression, no loss of interest in normal activity or change in sleep pattern.   Physical Exam: This is a 79 year old, well-developed, well-nourished, African American, male, in no obvious distress Vitals: Temperature 98.0 degrees, pulse 70, respirations 18,  blood pressure 156/64.  Weight 165 pounds HEENT reveals a normocephalic, atraumatic skull, no scleral icterus, no oral lesions  Neck is supple without any cervical or supraclavicular adenopathy.  Lungs are clear to auscultation bilaterally. There are no wheezes, rales or rhonci Cardiac is regular rate and rhythm with a normal S1 and S2. There are no murmurs, rubs, or bruits.  Abdomen is soft with good bowel sounds, there is no palpable mass. There is no palpable hepatosplenomegaly. There is no palpable fluid wave.  Musculoskeletal no tenderness of the spine, ribs, or hips.  Extremities there are no clubbing, cyanosis, or edema.  Skin no petechia, purpura or ecchymosis Neurologic is nonfocal.  Assessment/Plan: This is a very pleasant, 79 year old, African American, gentleman, with the following issues:  #1.  Thrombocytopenia. This could be mild immune thrombocytopenia.  This could also be medication related.  He is asymptomatic.  He's not having any active bleeding.  His platelet count is greater than 100,000, as, such, he is cleared for surgery.  His peripheral smear do not indicate any type of hematological malignancy.  His platelet count has been on the lower side of normal for quite some time now.  Currently, there is no indication  for any type of intervention.  At this time, we can go ahead and release him from our care, however, we will be more than glad to see him back.  If need be.

## 2012-10-12 NOTE — Telephone Encounter (Signed)
Pt made aware.  Forms faxed to [facility] wainer.

## 2012-10-13 ENCOUNTER — Encounter (HOSPITAL_COMMUNITY): Payer: Self-pay | Admitting: Pharmacy Technician

## 2012-10-14 NOTE — Pre-Procedure Instructions (Signed)
Christian Erickson  10/14/2012   Your procedure is scheduled on:  Wednesday, April 23rd  Report to [facility] Short Stay Center at 0745 AM.  Call this number if you have problems the morning of surgery: [phone]   Remember:   Do not eat food or drink liquids after midnight.    Take these medicines the morning of surgery with A SIP OF WATER: Norvasc, flonase, flomax   Do not wear jewelry.  Do not wear lotions, powders, or perfume, deodorant.  Do not shave 48 hours prior to surgery. Men may shave face and neck.  Do not bring valuables to the hospital.  Contacts, dentures or bridgework may not be worn into surgery.  Leave suitcase in the car. After surgery it may be brought to your room.  For patients admitted to the hospital, checkout time is 11:00 AM the day of discharge.   Patients discharged the day of surgery will not be allowed to drive home.    Special Instructions: Shower using CHG 2 nights before surgery and the night before surgery.  If you shower the day of surgery use CHG.  Use special wash - you have one bottle of CHG for all showers.  You should use approximately 1/3 of the bottle for each shower.   Please read over the following fact sheets that you were given: Pain Booklet, Coughing and Deep Breathing, Blood Transfusion Information, MRSA Information and Surgical Site Infection Prevention

## 2012-10-15 ENCOUNTER — Encounter (HOSPITAL_COMMUNITY): Payer: Self-pay

## 2012-10-15 ENCOUNTER — Encounter (HOSPITAL_COMMUNITY)
Admission: RE | Admit: 2012-10-15 | Discharge: 2012-10-15 | Disposition: A | Discharge status: Home / Self Care | Payer: Medicare Other | Source: Ambulatory Visit | Attending: Orthopedic Surgery | Admitting: Orthopedic Surgery

## 2012-10-15 ENCOUNTER — Ambulatory Visit (HOSPITAL_COMMUNITY)
Admission: RE | Admit: 2012-10-15 | Discharge: 2012-10-15 | Disposition: A | Discharge status: Home / Self Care | Payer: Medicare Other | Source: Ambulatory Visit | Attending: Surgery | Admitting: Surgery

## 2012-10-15 DIAGNOSIS — Z87891 Personal history of nicotine dependence: Secondary | ICD-10-CM | POA: Insufficient documentation

## 2012-10-15 DIAGNOSIS — E039 Hypothyroidism, unspecified: Secondary | ICD-10-CM | POA: Insufficient documentation

## 2012-10-15 DIAGNOSIS — Z01818 Encounter for other preprocedural examination: Secondary | ICD-10-CM | POA: Insufficient documentation

## 2012-10-15 DIAGNOSIS — E785 Hyperlipidemia, unspecified: Secondary | ICD-10-CM | POA: Insufficient documentation

## 2012-10-15 DIAGNOSIS — Z9049 Acquired absence of other specified parts of digestive tract: Secondary | ICD-10-CM | POA: Insufficient documentation

## 2012-10-15 DIAGNOSIS — N400 Enlarged prostate without lower urinary tract symptoms: Secondary | ICD-10-CM | POA: Insufficient documentation

## 2012-10-15 DIAGNOSIS — H409 Unspecified glaucoma: Secondary | ICD-10-CM | POA: Insufficient documentation

## 2012-10-15 DIAGNOSIS — K5730 Diverticulosis of large intestine without perforation or abscess without bleeding: Secondary | ICD-10-CM | POA: Insufficient documentation

## 2012-10-15 DIAGNOSIS — M129 Arthropathy, unspecified: Secondary | ICD-10-CM | POA: Insufficient documentation

## 2012-10-15 DIAGNOSIS — E119 Type 2 diabetes mellitus without complications: Secondary | ICD-10-CM | POA: Insufficient documentation

## 2012-10-15 DIAGNOSIS — D696 Thrombocytopenia, unspecified: Secondary | ICD-10-CM | POA: Insufficient documentation

## 2012-10-15 DIAGNOSIS — Z01812 Encounter for preprocedural laboratory examination: Secondary | ICD-10-CM | POA: Insufficient documentation

## 2012-10-15 HISTORY — DX: Unspecified osteoarthritis, unspecified site: M19.90

## 2012-10-15 HISTORY — DX: Disease of blood and blood-forming organs, unspecified: D75.9

## 2012-10-15 LAB — SURGICAL PCR SCREEN: Staphylococcus aureus: NEGATIVE

## 2012-10-15 LAB — COMPREHENSIVE METABOLIC PANEL
ALT: 13 U/L (ref 0–53)
AST: 20 U/L (ref 0–37)
Alkaline Phosphatase: 79 U/L (ref 39–117)
CO2: 28 mEq/L (ref 19–32)
Calcium: 9.3 mg/dL (ref 8.4–10.5)
Chloride: 102 mEq/L (ref 96–112)
GFR calc Af Amer: 70 mL/min — ABNORMAL LOW (ref >90)
GFR calc non Af Amer: 61 mL/min — ABNORMAL LOW (ref >90)
Glucose, Bld: 117 mg/dL — ABNORMAL HIGH (ref 70–99)
Sodium: 139 mEq/L (ref 135–145)
Total Bilirubin: 0.5 mg/dL (ref 0.3–1.2)

## 2012-10-15 LAB — TYPE AND SCREEN: ABO/RH(D): O POS

## 2012-10-15 LAB — URINALYSIS, ROUTINE W REFLEX MICROSCOPIC
Glucose, UA: NEGATIVE mg/dL
Hgb urine dipstick: NEGATIVE
Leukocytes, UA: NEGATIVE
Specific Gravity, Urine: 1.02 (ref 1.005–1.030)
pH: 5 (ref 5.0–8.0)

## 2012-10-15 LAB — CBC
Hemoglobin: 15 g/dL (ref 13.0–17.0)
MCH: 29.8 pg (ref 26.0–34.0)
RBC: 5.04 MIL/uL (ref 4.22–5.81)
WBC: 4.2 10*3/uL (ref 4.0–10.5)

## 2012-10-15 LAB — ABO/RH: ABO/RH(D): O POS

## 2012-10-18 NOTE — Progress Notes (Signed)
Anesthesia Chart Review:  Patient is an 79 year old male scheduled for right TKR by Dr. Eulah Pont on 10/27/12.  History includes former smoker, BPH, DM2, HTN, HLD, diverticulitis with history of colectomy '90's, ED, glaucoma, subclinical hypothyroidism, blood dyscrasia, arthritis. PCP is Dr. Willow Ora who medically cleared patient for this procedure following a normal stress test and hematology referral (seen by Eunice Blase, PA-C at [facility]) for chronic thrombocytopenia.   EKG on 09/20/12 showed SB @ 56 bpm.  Nuclear stress test on 09/29/12 showed: Normal stress nuclear study. This is a low risk scan. There is no significant abnormality. LV Ejection Fraction: 73%. LV Wall Motion: Normal Wall Motion.  CXR report on 10/15/12 showed no active disease.  Preoperative labs noted.  PLT 141K, which appears at his baseline.  If no acute change then would anticipate he could proceed as planned.  Velna Ochs [facility] Short Stay Center/Anesthesiology Phone [phone] 10/18/2012 1:03 PM

## 2012-10-26 MED ORDER — CEFAZOLIN SODIUM-DEXTROSE 2-3 GM-% IV SOLR
2.0000 g | INTRAVENOUS | Status: AC
  Administered 2012-10-27: 2 g via INTRAVENOUS
  Filled 2012-10-26: qty 50

## 2012-10-26 NOTE — H&P (Signed)
  [facility] [address]   [address] [place], [place] [address] [phone] A Division of [facility]  Loreta Ave, M.D.   Robert A. Thurston Hole, M.D.   Burnell Blanks, M.D.   Eulas Post, M.D.   Lunette Stands, M.D Buford Dresser, M.D.  Charlsie Quest, M.D.   Estell Harpin, M.D.   Melina Fiddler, M.D. Genene Churn. Barry Dienes, PA-C            Kirstin A. Shepperson, PA-C Josh C[place], PA-C [place], [place]   RE: [place], [place]   [phone]      DOB: April 22, 1931 PROGRESS NOTE: 10-12-12 Chief complaint:  right knee pain. History of present illness: 79 year old black male  with right knee end stage degenerative joint disease and chronic pain returns. States symptoms unchanged from previous visit and he wants to proceed with total knee replacement as scheduled. He's failed conservative treatment. Continues to have pain with activity. He had a strength test yesterday but we do not have the results of this yet. He was recently seen by Dr. Myna Hidalgo due to history of platelet issues. He has history of diverticulitis but has not had issues with this since the 80's. On occasion has dark stools from using Pepto Bismal for diarrhea.  Current medications: Norvasc, aspirin, Lipitor, Lumigan, Cardura, Flonase, Glucophage multivitamin Onglyza, Flomax, Livitra.  Allergies: Vasotec.  Past medical/surgical history: BPH diabetes hypertension hyperlipidemia diverticulitis erectile dysfunction glaucoma allergic rhinitis inguinal hernia repair colectomy with reversal.  Family history: positive coronary artery disease diabetes stroke prostate cancer colon cancer.  Social history: he is married and has 2 children. He is retired. Smoked several years ago. Admits social alcohol. Review of systems: denies light headedness, dizziness, fevers, chills, cardiac, pulmonary, GI, GU or neuro issues.  EXAMINATION: Height 5'6" weight 167 pounds. Temp 97.3. Blood  pressure 164/80. Pulse 60. Alert and oriented x3 in no acute distress. Antalgic gait. Head is normal cephalic atraumatic. PERRLA and EOMI. Neck unremarkable. Lungs CTA bilaterally. No wheezes noted. Heart regular rate and rhythm no murmurs. Abdomen round non-distended. NABS x4. Soft non-tender. Right knee decreased range of motion  positive crepitus positive effusion. Joint line tenderness. Ligaments stable. Calf non-tender neurovascularly intact. Skin warm and dry. No increase in respiratory effort.   X-RAYS: Previous films right knee show end stage degenerative joint disease with periarticular spurs.   IMPRESSION: Right knee end stage degenerative joint disease and chronic pain.   DISPOSITION: We'll proceed with total knee replacement as scheduled. Discussed risks benefits and possible complications. He will need short skilled placement and I gave him information for [facility]. We'll await records from Dr. Myna Hidalgo and Dr. Drue Novel. All questions answered.  Loreta Ave, M.D.  Electronically verified by Loreta Ave, M.D. DFM(JMO):kh D 10-14-12 T 10-14-12

## 2012-10-26 NOTE — Progress Notes (Signed)
PT NOTIFIED OF TIME CHANGE;TO ARRIVE AT 0700

## 2012-10-27 ENCOUNTER — Inpatient Hospital Stay (HOSPITAL_COMMUNITY): Payer: Medicare Other | Admitting: Anesthesiology

## 2012-10-27 ENCOUNTER — Inpatient Hospital Stay (HOSPITAL_COMMUNITY)
Admission: RE | Admit: 2012-10-27 | Discharge: 2012-10-30 | DRG: 470 | Disposition: A | Discharge status: Skilled Nursing Facility | Payer: Medicare Other | Source: Ambulatory Visit | Attending: Orthopedic Surgery | Admitting: Orthopedic Surgery

## 2012-10-27 ENCOUNTER — Encounter (HOSPITAL_COMMUNITY)
Admission: RE | Disposition: A | Discharge status: Skilled Nursing Facility | Payer: Self-pay | Source: Ambulatory Visit | Attending: Orthopedic Surgery

## 2012-10-27 ENCOUNTER — Encounter (HOSPITAL_COMMUNITY): Payer: Self-pay | Admitting: *Deleted

## 2012-10-27 ENCOUNTER — Encounter (HOSPITAL_COMMUNITY): Payer: Self-pay | Admitting: Vascular Surgery

## 2012-10-27 ENCOUNTER — Inpatient Hospital Stay (HOSPITAL_COMMUNITY): Payer: Medicare Other

## 2012-10-27 DIAGNOSIS — Z9119 Patient's noncompliance with other medical treatment and regimen: Secondary | ICD-10-CM

## 2012-10-27 DIAGNOSIS — M1710 Unilateral primary osteoarthritis, unspecified knee: Principal | ICD-10-CM | POA: Diagnosis present

## 2012-10-27 DIAGNOSIS — Z96651 Presence of right artificial knee joint: Secondary | ICD-10-CM

## 2012-10-27 DIAGNOSIS — N400 Enlarged prostate without lower urinary tract symptoms: Secondary | ICD-10-CM | POA: Diagnosis present

## 2012-10-27 DIAGNOSIS — Z79899 Other long term (current) drug therapy: Secondary | ICD-10-CM

## 2012-10-27 DIAGNOSIS — I10 Essential (primary) hypertension: Secondary | ICD-10-CM | POA: Diagnosis present

## 2012-10-27 DIAGNOSIS — E871 Hypo-osmolality and hyponatremia: Secondary | ICD-10-CM | POA: Diagnosis not present

## 2012-10-27 DIAGNOSIS — Z91199 Patient's noncompliance with other medical treatment and regimen due to unspecified reason: Secondary | ICD-10-CM

## 2012-10-27 DIAGNOSIS — N529 Male erectile dysfunction, unspecified: Secondary | ICD-10-CM | POA: Diagnosis present

## 2012-10-27 DIAGNOSIS — E785 Hyperlipidemia, unspecified: Secondary | ICD-10-CM | POA: Diagnosis present

## 2012-10-27 DIAGNOSIS — E119 Type 2 diabetes mellitus without complications: Secondary | ICD-10-CM | POA: Diagnosis present

## 2012-10-27 HISTORY — PX: TOTAL KNEE ARTHROPLASTY: SHX125

## 2012-10-27 LAB — CREATININE, SERUM: Creatinine, Ser: 0.95 mg/dL (ref 0.50–1.35)

## 2012-10-27 LAB — GLUCOSE, CAPILLARY
Glucose-Capillary: 122 mg/dL — ABNORMAL HIGH (ref 70–99)
Glucose-Capillary: 178 mg/dL — ABNORMAL HIGH (ref 70–99)

## 2012-10-27 LAB — CBC
Hemoglobin: 13.8 g/dL (ref 13.0–17.0)
MCHC: 34.4 g/dL (ref 30.0–36.0)
RDW: 14.2 % (ref 11.5–15.5)

## 2012-10-27 SURGERY — ARTHROPLASTY, KNEE, TOTAL
Anesthesia: General | Site: Knee | Laterality: Right | Wound class: Clean

## 2012-10-27 MED ORDER — MENTHOL 3 MG MT LOZG: 1.0000 | LOZENGE | OROMUCOSAL | Status: DC | PRN

## 2012-10-27 MED ORDER — GLYCOPYRROLATE 0.2 MG/ML IJ SOLN
INTRAMUSCULAR | Status: DC | PRN
  Administered 2012-10-27: .6 mg via INTRAVENOUS

## 2012-10-27 MED ORDER — ONDANSETRON HCL 4 MG/2ML IJ SOLN: 4.0000 mg | Freq: Four times a day (QID) | INTRAMUSCULAR | Status: DC | PRN

## 2012-10-27 MED ORDER — COUMADIN BOOK
Freq: Once | Status: DC
  Filled 2012-10-27: qty 1

## 2012-10-27 MED ORDER — BUPIVACAINE HCL (PF) 0.25 % IJ SOLN
INTRAMUSCULAR | Status: AC
  Filled 2012-10-27: qty 30

## 2012-10-27 MED ORDER — FLUTICASONE PROPIONATE 50 MCG/ACT NA SUSP
2.0000 | NASAL | Status: DC | PRN
  Filled 2012-10-27: qty 16

## 2012-10-27 MED ORDER — SODIUM CHLORIDE 0.9 % IJ SOLN
INTRAMUSCULAR | Status: DC | PRN
  Administered 2012-10-27: 11:00:00

## 2012-10-27 MED ORDER — WARFARIN - PHARMACIST DOSING INPATIENT: Freq: Every day | Status: DC

## 2012-10-27 MED ORDER — EPHEDRINE SULFATE 50 MG/ML IJ SOLN
INTRAMUSCULAR | Status: DC | PRN
  Administered 2012-10-27 (×2): 10 mg via INTRAVENOUS

## 2012-10-27 MED ORDER — SODIUM CHLORIDE 0.9 % IR SOLN
Status: DC | PRN
  Administered 2012-10-27: 4000 mL

## 2012-10-27 MED ORDER — BUPIVACAINE HCL (PF) 0.25 % IJ SOLN
INTRAMUSCULAR | Status: DC | PRN
  Administered 2012-10-27: 10 mL via INTRA_ARTICULAR

## 2012-10-27 MED ORDER — CEFAZOLIN SODIUM 1-5 GM-% IV SOLN
1.0000 g | Freq: Three times a day (TID) | INTRAVENOUS | Status: AC
  Administered 2012-10-27 (×2): 1 g via INTRAVENOUS
  Filled 2012-10-27 (×2): qty 50

## 2012-10-27 MED ORDER — METOCLOPRAMIDE HCL 5 MG/ML IJ SOLN: 5.0000 mg | Freq: Three times a day (TID) | INTRAMUSCULAR | Status: DC | PRN

## 2012-10-27 MED ORDER — BIMATOPROST 0.03 % OP SOLN
1.0000 [drp] | Freq: Every day | OPHTHALMIC | Status: DC
  Filled 2012-10-27: qty 2.5

## 2012-10-27 MED ORDER — ROCURONIUM BROMIDE 100 MG/10ML IV SOLN
INTRAVENOUS | Status: DC | PRN
  Administered 2012-10-27: 50 mg via INTRAVENOUS

## 2012-10-27 MED ORDER — AMLODIPINE BESYLATE 5 MG PO TABS
5.0000 mg | ORAL_TABLET | Freq: Every day | ORAL | Status: DC
  Administered 2012-10-27 – 2012-10-30 (×4): 5 mg via ORAL
  Filled 2012-10-27 (×4): qty 1

## 2012-10-27 MED ORDER — LACTATED RINGERS IV SOLN
INTRAVENOUS | Status: DC | PRN
  Administered 2012-10-27: 09:00:00 via INTRAVENOUS

## 2012-10-27 MED ORDER — FENTANYL CITRATE 0.05 MG/ML IJ SOLN
INTRAMUSCULAR | Status: DC | PRN
  Administered 2012-10-27: 50 ug via INTRAVENOUS
  Administered 2012-10-27: 100 ug via INTRAVENOUS

## 2012-10-27 MED ORDER — ENOXAPARIN SODIUM 30 MG/0.3ML ~~LOC~~ SOLN
30.0000 mg | Freq: Two times a day (BID) | SUBCUTANEOUS | Status: DC
  Administered 2012-10-28 – 2012-10-30 (×5): 30 mg via SUBCUTANEOUS
  Filled 2012-10-27 (×7): qty 0.3

## 2012-10-27 MED ORDER — HYDROMORPHONE HCL PF 1 MG/ML IJ SOLN
0.5000 mg | INTRAMUSCULAR | Status: DC | PRN
  Administered 2012-10-27 – 2012-10-28 (×2): 0.5 mg via INTRAVENOUS
  Filled 2012-10-27 (×2): qty 1

## 2012-10-27 MED ORDER — ACETAMINOPHEN 650 MG RE SUPP: 650.0000 mg | Freq: Four times a day (QID) | RECTAL | Status: DC | PRN

## 2012-10-27 MED ORDER — MIDAZOLAM HCL 5 MG/5ML IJ SOLN
INTRAMUSCULAR | Status: DC | PRN
  Administered 2012-10-27: 1 mg via INTRAVENOUS

## 2012-10-27 MED ORDER — INSULIN ASPART 100 UNIT/ML ~~LOC~~ SOLN
0.0000 [IU] | Freq: Three times a day (TID) | SUBCUTANEOUS | Status: DC
  Administered 2012-10-27 – 2012-10-28 (×2): 3 [IU] via SUBCUTANEOUS
  Administered 2012-10-28: 2 [IU] via SUBCUTANEOUS
  Administered 2012-10-28: 5 [IU] via SUBCUTANEOUS
  Administered 2012-10-29 (×2): 3 [IU] via SUBCUTANEOUS
  Administered 2012-10-29: 17:00:00 via SUBCUTANEOUS

## 2012-10-27 MED ORDER — PHENYLEPHRINE HCL 10 MG/ML IJ SOLN
INTRAMUSCULAR | Status: DC | PRN
  Administered 2012-10-27 (×3): 80 ug via INTRAVENOUS
  Administered 2012-10-27 (×2): 120 ug via INTRAVENOUS

## 2012-10-27 MED ORDER — DEXTROSE 5 % IV SOLN
500.0000 mg | Freq: Four times a day (QID) | INTRAVENOUS | Status: DC | PRN
  Filled 2012-10-27: qty 5

## 2012-10-27 MED ORDER — POTASSIUM CHLORIDE IN NACL 20-0.9 MEQ/L-% IV SOLN
INTRAVENOUS | Status: DC
  Administered 2012-10-27: via INTRAVENOUS
  Filled 2012-10-27 (×9): qty 1000

## 2012-10-27 MED ORDER — ONDANSETRON HCL 4 MG/2ML IJ SOLN
INTRAMUSCULAR | Status: DC | PRN
  Administered 2012-10-27: 4 mg via INTRAVENOUS

## 2012-10-27 MED ORDER — LACTATED RINGERS IV SOLN
INTRAVENOUS | Status: DC
  Administered 2012-10-27: 10:00:00 via INTRAVENOUS

## 2012-10-27 MED ORDER — SENNOSIDES-DOCUSATE SODIUM 8.6-50 MG PO TABS: 1.0000 | ORAL_TABLET | Freq: Every evening | ORAL | Status: DC | PRN

## 2012-10-27 MED ORDER — BUPIVACAINE LIPOSOME 1.3 % IJ SUSP
20.0000 mL | Freq: Once | INTRAMUSCULAR | Status: DC
  Filled 2012-10-27: qty 20

## 2012-10-27 MED ORDER — LINAGLIPTIN 5 MG PO TABS
5.0000 mg | ORAL_TABLET | Freq: Every day | ORAL | Status: DC
  Administered 2012-10-27 – 2012-10-30 (×4): 5 mg via ORAL
  Filled 2012-10-27 (×4): qty 1

## 2012-10-27 MED ORDER — HYDROMORPHONE HCL PF 1 MG/ML IJ SOLN
0.2500 mg | INTRAMUSCULAR | Status: DC | PRN
  Administered 2012-10-27 (×4): 0.25 mg via INTRAVENOUS

## 2012-10-27 MED ORDER — BISACODYL 10 MG RE SUPP: 10.0000 mg | Freq: Every day | RECTAL | Status: DC | PRN

## 2012-10-27 MED ORDER — HYDROMORPHONE HCL PF 1 MG/ML IJ SOLN
INTRAMUSCULAR | Status: AC
  Filled 2012-10-27: qty 1

## 2012-10-27 MED ORDER — PROPOFOL 10 MG/ML IV BOLUS
INTRAVENOUS | Status: DC | PRN
  Administered 2012-10-27: 200 mg via INTRAVENOUS

## 2012-10-27 MED ORDER — PHENOL 1.4 % MT LIQD: 1.0000 | OROMUCOSAL | Status: DC | PRN

## 2012-10-27 MED ORDER — HYDROCODONE-ACETAMINOPHEN 10-325 MG PO TABS
1.0000 | ORAL_TABLET | ORAL | Status: DC | PRN
  Administered 2012-10-27 – 2012-10-28 (×3): 2 via ORAL
  Administered 2012-10-28: 1 via ORAL
  Administered 2012-10-28 – 2012-10-29 (×2): 2 via ORAL
  Administered 2012-10-29: 1 via ORAL
  Filled 2012-10-27 (×4): qty 2
  Filled 2012-10-27: qty 1
  Filled 2012-10-27: qty 2
  Filled 2012-10-27: qty 1

## 2012-10-27 MED ORDER — LIDOCAINE HCL (CARDIAC) 20 MG/ML IV SOLN
INTRAVENOUS | Status: DC | PRN
  Administered 2012-10-27: 60 mg via INTRAVENOUS

## 2012-10-27 MED ORDER — ACETAMINOPHEN 325 MG PO TABS: 650.0000 mg | ORAL_TABLET | Freq: Four times a day (QID) | ORAL | Status: DC | PRN

## 2012-10-27 MED ORDER — BIMATOPROST 0.01 % OP SOLN
1.0000 [drp] | Freq: Every day | OPHTHALMIC | Status: DC
  Administered 2012-10-27 – 2012-10-29 (×3): 1 [drp] via OPHTHALMIC
  Filled 2012-10-27: qty 2.5

## 2012-10-27 MED ORDER — NEOSTIGMINE METHYLSULFATE 1 MG/ML IJ SOLN
INTRAMUSCULAR | Status: DC | PRN
  Administered 2012-10-27: 3.5 mg via INTRAVENOUS

## 2012-10-27 MED ORDER — DOCUSATE SODIUM 100 MG PO CAPS
100.0000 mg | ORAL_CAPSULE | Freq: Two times a day (BID) | ORAL | Status: DC
  Administered 2012-10-27 – 2012-10-30 (×6): 100 mg via ORAL
  Filled 2012-10-27 (×6): qty 1

## 2012-10-27 MED ORDER — ONDANSETRON HCL 4 MG/2ML IJ SOLN: 4.0000 mg | Freq: Once | INTRAMUSCULAR | Status: DC | PRN

## 2012-10-27 MED ORDER — METFORMIN HCL 500 MG PO TABS
1000.0000 mg | ORAL_TABLET | Freq: Two times a day (BID) | ORAL | Status: DC
  Administered 2012-10-27 – 2012-10-29 (×4): 1000 mg via ORAL
  Filled 2012-10-27 (×6): qty 2

## 2012-10-27 MED ORDER — ONDANSETRON HCL 4 MG PO TABS: 4.0000 mg | ORAL_TABLET | Freq: Four times a day (QID) | ORAL | Status: DC | PRN

## 2012-10-27 MED ORDER — TAMSULOSIN HCL 0.4 MG PO CAPS
0.4000 mg | ORAL_CAPSULE | Freq: Every day | ORAL | Status: DC
  Administered 2012-10-28 – 2012-10-30 (×3): 0.4 mg via ORAL
  Filled 2012-10-27 (×3): qty 1

## 2012-10-27 MED ORDER — METOCLOPRAMIDE HCL 10 MG PO TABS: 5.0000 mg | ORAL_TABLET | Freq: Three times a day (TID) | ORAL | Status: DC | PRN

## 2012-10-27 MED ORDER — WARFARIN SODIUM 7.5 MG PO TABS
7.5000 mg | ORAL_TABLET | Freq: Once | ORAL | Status: AC
  Administered 2012-10-27: 7.5 mg via ORAL
  Filled 2012-10-27: qty 1

## 2012-10-27 MED ORDER — SODIUM CHLORIDE 0.9 % IJ SOLN
INTRAMUSCULAR | Status: AC
  Filled 2012-10-27: qty 12

## 2012-10-27 MED ORDER — WARFARIN VIDEO
Freq: Once | Status: AC
  Administered 2012-10-28: 10:00:00

## 2012-10-27 MED ORDER — HYDROMORPHONE HCL PF 1 MG/ML IJ SOLN: 0.2500 mg | INTRAMUSCULAR | Status: DC | PRN

## 2012-10-27 MED ORDER — LIDOCAINE HCL 4 % MT SOLN
OROMUCOSAL | Status: DC | PRN
  Administered 2012-10-27: 4 mL via TOPICAL

## 2012-10-27 MED ORDER — DOXAZOSIN MESYLATE 2 MG PO TABS
2.0000 mg | ORAL_TABLET | Freq: Every day | ORAL | Status: DC
  Administered 2012-10-27 – 2012-10-29 (×3): 2 mg via ORAL
  Filled 2012-10-27 (×4): qty 1

## 2012-10-27 MED ORDER — ATORVASTATIN CALCIUM 20 MG PO TABS
20.0000 mg | ORAL_TABLET | Freq: Every day | ORAL | Status: DC
  Administered 2012-10-27 – 2012-10-30 (×4): 20 mg via ORAL
  Filled 2012-10-27 (×4): qty 1

## 2012-10-27 MED ORDER — METHOCARBAMOL 500 MG PO TABS
500.0000 mg | ORAL_TABLET | Freq: Four times a day (QID) | ORAL | Status: DC | PRN
  Administered 2012-10-27 – 2012-10-30 (×6): 500 mg via ORAL
  Filled 2012-10-27 (×6): qty 1

## 2012-10-27 SURGICAL SUPPLY — 57 items
BANDAGE ESMARK 6X9 LF (GAUZE/BANDAGES/DRESSINGS) ×1 IMPLANT
BLADE SAG 18X100X1.27 (BLADE) ×4 IMPLANT
BNDG CMPR 9X6 STRL LF SNTH (GAUZE/BANDAGES/DRESSINGS) ×1
BNDG ESMARK 6X9 LF (GAUZE/BANDAGES/DRESSINGS) ×2
BOWL SMART MIX CTS (DISPOSABLE) ×2 IMPLANT
CEMENT BONE SIMPLEX SPEEDSET (Cement) ×4 IMPLANT
CLOTH BEACON ORANGE TIMEOUT ST (SAFETY) ×2 IMPLANT
COVER SURGICAL LIGHT HANDLE (MISCELLANEOUS) ×2 IMPLANT
CUFF TOURNIQUET SINGLE 34IN LL (TOURNIQUET CUFF) ×2 IMPLANT
DRAPE EXTREMITY T 121X128X90 (DRAPE) ×2 IMPLANT
DRAPE PROXIMA HALF (DRAPES) ×2 IMPLANT
DRAPE U-SHAPE 47X51 STRL (DRAPES) ×2 IMPLANT
DRSG PAD ABDOMINAL 8X10 ST (GAUZE/BANDAGES/DRESSINGS) ×2 IMPLANT
DURAPREP 26ML APPLICATOR (WOUND CARE) ×2 IMPLANT
ELECT CAUTERY BLADE 6.4 (BLADE) ×2 IMPLANT
ELECT REM PT RETURN 9FT ADLT (ELECTROSURGICAL) ×2
ELECTRODE REM PT RTRN 9FT ADLT (ELECTROSURGICAL) ×1 IMPLANT
EVACUATOR 1/8 PVC DRAIN (DRAIN) ×2 IMPLANT
FACESHIELD LNG OPTICON STERILE (SAFETY) ×2 IMPLANT
GAUZE XEROFORM 5X9 LF (GAUZE/BANDAGES/DRESSINGS) ×2 IMPLANT
GLOVE BIOGEL PI IND STRL 8 (GLOVE) ×1 IMPLANT
GLOVE BIOGEL PI INDICATOR 8 (GLOVE) ×1
GLOVE ORTHO TXT STRL SZ7.5 (GLOVE) ×2 IMPLANT
GOWN PREVENTION PLUS XLARGE (GOWN DISPOSABLE) ×4 IMPLANT
GOWN STRL NON-REIN LRG LVL3 (GOWN DISPOSABLE) ×4 IMPLANT
GOWN STRL REIN 2XL XLG LVL4 (GOWN DISPOSABLE) ×2 IMPLANT
HANDPIECE INTERPULSE COAX TIP (DISPOSABLE) ×2
IMMOBILIZER KNEE 22 UNIV (SOFTGOODS) ×2 IMPLANT
IMMOBILIZER KNEE 24 THIGH 36 (MISCELLANEOUS) IMPLANT
IMMOBILIZER KNEE 24 UNIV (MISCELLANEOUS)
KIT BASIN OR (CUSTOM PROCEDURE TRAY) ×2 IMPLANT
KIT ROOM TURNOVER OR (KITS) ×2 IMPLANT
MANIFOLD NEPTUNE II (INSTRUMENTS) ×2 IMPLANT
NDL 18GX1X1/2 (RX/OR ONLY) (NEEDLE) ×1 IMPLANT
NDL HYPO 25GX1X1/2 BEV (NEEDLE) ×1 IMPLANT
NEEDLE 18GX1X1/2 (RX/OR ONLY) (NEEDLE) ×2 IMPLANT
NEEDLE HYPO 25GX1X1/2 BEV (NEEDLE) ×2 IMPLANT
NS IRRIG 1000ML POUR BTL (IV SOLUTION) ×2 IMPLANT
PACK TOTAL JOINT (CUSTOM PROCEDURE TRAY) ×2 IMPLANT
PAD ARMBOARD 7.5X6 YLW CONV (MISCELLANEOUS) ×4 IMPLANT
PAD CAST 4YDX4 CTTN HI CHSV (CAST SUPPLIES) ×1 IMPLANT
PADDING CAST COTTON 4X4 STRL (CAST SUPPLIES) ×2
PADDING CAST COTTON 6X4 STRL (CAST SUPPLIES) ×2 IMPLANT
RUBBERBAND STERILE (MISCELLANEOUS) ×2 IMPLANT
SET HNDPC FAN SPRY TIP SCT (DISPOSABLE) ×1 IMPLANT
SPONGE GAUZE 4X4 12PLY (GAUZE/BANDAGES/DRESSINGS) ×2 IMPLANT
STAPLER VISISTAT 35W (STAPLE) ×2 IMPLANT
SUCTION FRAZIER TIP 10 FR DISP (SUCTIONS) ×2 IMPLANT
SUT VIC AB 1 CTX 36 (SUTURE) ×4
SUT VIC AB 1 CTX36XBRD ANBCTR (SUTURE) ×2 IMPLANT
SUT VIC AB 2-0 CT1 27 (SUTURE) ×4
SUT VIC AB 2-0 CT1 TAPERPNT 27 (SUTURE) ×2 IMPLANT
SYR CONTROL 10ML LL (SYRINGE) ×2 IMPLANT
TOWEL OR 17X24 6PK STRL BLUE (TOWEL DISPOSABLE) ×2 IMPLANT
TOWEL OR 17X26 10 PK STRL BLUE (TOWEL DISPOSABLE) ×2 IMPLANT
TRAY FOLEY CATH 14FR (SET/KITS/TRAYS/PACK) ×2 IMPLANT
WATER STERILE IRR 1000ML POUR (IV SOLUTION) ×4 IMPLANT

## 2012-10-27 NOTE — Brief Op Note (Signed)
10/27/2012  2:25 PM  PATIENT:  Christian Erickson  79 y.o. male  PRE-OPERATIVE DIAGNOSIS:  DJD RIGHT KNEE  POST-OPERATIVE DIAGNOSIS:  DJD RIGHT KNEE  PROCEDURE:  Procedure(s): TOTAL KNEE ARTHROPLASTY (Right)  SURGEON:  Surgeon(s) and Role:    * Loreta Ave, MD - Primary  PHYSICIAN ASSISTANT: ,Naida Sleight     ANESTHESIA:   general  EBL:  Total I/O In: 1000 [I.V.:1000] Out: 600 [Urine:550; Blood:50]  BLOOD ADMINISTERED:none   DRAINS: hemovac  LOCAL MEDICATIONS USED:  Exparel/injectable ns 20:40  SPECIMEN:  No Specimen  DISPOSITION OF SPECIMEN:  none  COUNTS:  YES  TOURNIQUET:   Total Tourniquet Time Documented: Thigh (Right) - 67 minutes Total: Thigh (Right) - 67 minutes     PATIENT DISPOSITION:  PACU - hemodynamically stable.

## 2012-10-27 NOTE — Transfer of Care (Signed)
Immediate Anesthesia Transfer of Care Note  Patient: Christian Erickson  Procedure(s) Performed: Procedure(s): TOTAL KNEE ARTHROPLASTY (Right)  Patient Location: PACU  Anesthesia Type:General  Level of Consciousness: awake, alert  and oriented  Airway & Oxygen Therapy: Patient Spontanous Breathing and Patient connected to nasal cannula oxygen  Post-op Assessment: Report given to PACU RN, Post -op Vital signs reviewed and stable and Patient moving all extremities X 4  Post vital signs: Reviewed and stable  Complications: No apparent anesthesia complications

## 2012-10-27 NOTE — Preoperative (Signed)
Beta Blockers   Reason not to administer Beta Blockers:Not Applicable 

## 2012-10-27 NOTE — Progress Notes (Signed)
Orthopedic Tech Progress Note Patient Details:  Christian Erickson Dec 16, 1930 [id number]      [name] 10/27/2012, 12:16 PM

## 2012-10-27 NOTE — Anesthesia Preprocedure Evaluation (Addendum)
Anesthesia Evaluation  Patient identified by MRN, date of birth, ID band Patient awake    Reviewed: Allergy & Precautions, H&P , NPO status , Patient's Chart, lab work & pertinent test results  Airway Mallampati: II      Dental  (+) Teeth Intact   Pulmonary neg pulmonary ROS,  breath sounds clear to auscultation        Cardiovascular hypertension, Pt. on medications Rhythm:Regular Rate:Normal     Neuro/Psych Anxiety    GI/Hepatic negative GI ROS, Neg liver ROS,   Endo/Other  diabetes, Type 2Hypothyroidism   Renal/GU      Musculoskeletal   Abdominal   Peds  Hematology   Anesthesia Other Findings   Reproductive/Obstetrics                          Anesthesia Physical Anesthesia Plan  ASA: III  Anesthesia Plan: General   Post-op Pain Management:    Induction: Intravenous  Airway Management Planned: Oral ETT  Additional Equipment:   Intra-op Plan:   Post-operative Plan:   Informed Consent: I have reviewed the patients History and Physical, chart, labs and discussed the procedure including the risks, benefits and alternatives for the proposed anesthesia with the patient or authorized representative who has indicated his/her understanding and acceptance.   Dental advisory given  Plan Discussed with: CRNA, Anesthesiologist and Surgeon  Anesthesia Plan Comments:         Anesthesia Quick Evaluation

## 2012-10-27 NOTE — Interval H&P Note (Signed)
History and Physical Interval Note:  10/27/2012 8:26 AM  Christian Erickson  has presented today for surgery, with the diagnosis of DJD RIGHT KNEE  The various methods of treatment have been discussed with the patient and family. After consideration of risks, benefits and other options for treatment, the patient has consented to  Procedure(s): TOTAL KNEE ARTHROPLASTY (Right) as a surgical intervention .  The patient's history has been reviewed, patient examined, no change in status, stable for surgery.  I have reviewed the patient's chart and labs.  Questions were answered to the patient's satisfaction.     Linden Mikes F

## 2012-10-27 NOTE — Progress Notes (Signed)
rec'd report from Charlann Boxer RN, assuming care of patient

## 2012-10-27 NOTE — Progress Notes (Signed)
ANTICOAGULATION CONSULT NOTE - Initial Consult  Pharmacy Consult for Coumadin Indication: VTE prophylaxis  Allergies  Allergen Reactions  . Beef-Derived Products Swelling  . Pork-Derived Products Swelling  . Vasotec     Angioedema, see OV 09-05-11    Patient Measurements:   Heparin Dosing Weight:   Vital Signs: Temp: 97.8 F (36.6 C) (04/23 1553) BP: 140/57 mmHg (04/23 1553) Pulse Rate: 82 (04/23 1553)  Labs:  Recent Labs  10/27/12 1351  HGB 13.8  HCT 40.1  PLT 118*    The CrCl is unknown because both a height and weight (above a minimum accepted value) are required for this calculation.   Medical History: Past Medical History  Diagnosis Date  . BPH (benign prostatic hypertrophy)   . Diabetes mellitus   . Hypertension   . Hyperlipidemia   . Diverticulitis   . ED (erectile dysfunction)   . Glaucoma(365)     sees eye doctor routinely  . Allergic rhinitis   . Subclinical hypothyroidism   . Arthritis   . Blood dyscrasia     Medications:  Scheduled:  . amLODipine  5 mg Oral Daily  . atorvastatin  20 mg Oral Daily  . bimatoprost  1 drop Both Eyes QHS  . bupivacaine liposome  20 mL Infiltration Once  .  ceFAZolin (ANCEF) IV  1 g Intravenous Q8H  . [COMPLETED]  ceFAZolin (ANCEF) IV  2 g Intravenous On Call to OR  . coumadin book   Does not apply Once  . docusate sodium  100 mg Oral BID  . doxazosin  2 mg Oral QHS  . [START ON 10/28/2012] enoxaparin (LOVENOX) injection  30 mg Subcutaneous Q12H  . HYDROmorphone      . insulin aspart  0-15 Units Subcutaneous TID WC  . linagliptin  5 mg Oral Daily  . metFORMIN  1,000 mg Oral BID WC  . [START ON 10/28/2012] tamsulosin  0.4 mg Oral Daily  . warfarin  7.5 mg Oral ONCE-1800  . [START ON 10/28/2012] warfarin   Does not apply Once  . Warfarin - Pharmacist Dosing Inpatient   Does not apply q1800  . [DISCONTINUED] bimatoprost  1 drop Both Eyes QHS    Assessment: 79 yr old male with total knee replacement. OK'd for  surgery in spite of low pltc of 118.  Goal of Therapy:  INR 2-3 Monitor platelets by anticoagulation protocol: Yes   Plan:  Coumadin 7.5 mg today Daily PT/INR Ordered coumadin booklet and video.  Eugene Garnet 10/27/2012,4:49 PM

## 2012-10-27 NOTE — Anesthesia Procedure Notes (Signed)
Procedure Name: Intubation Date/Time: 10/27/2012 10:06 AM Performed by: Sharlene Dory E Pre-anesthesia Checklist: Patient identified, Emergency Drugs available, Suction available, Patient being monitored and Timeout performed Patient Re-evaluated:Patient Re-evaluated prior to inductionOxygen Delivery Method: Circle system utilized Preoxygenation: Pre-oxygenation with 100% oxygen Intubation Type: IV induction Ventilation: Mask ventilation without difficulty Laryngoscope Size: Mac and 3 Grade View: Grade II Tube type: Oral Tube size: 7.5 mm Number of attempts: 1 Airway Equipment and Method: Stylet and LTA kit utilized Placement Confirmation: ETT inserted through vocal cords under direct vision,  positive ETCO2 and breath sounds checked- equal and bilateral Secured at: 22 cm Tube secured with: Tape Dental Injury: Teeth and Oropharynx as per pre-operative assessment

## 2012-10-27 NOTE — Anesthesia Postprocedure Evaluation (Signed)
  Anesthesia Post-op Note  Patient: Christian Erickson  Procedure(s) Performed: Procedure(s): TOTAL KNEE ARTHROPLASTY (Right)  Patient Location: PACU  Anesthesia Type:General  Level of Consciousness: awake and alert   Airway and Oxygen Therapy: Patient Spontanous Breathing and Patient connected to nasal cannula oxygen  Post-op Pain: moderate  Post-op Assessment: Post-op Vital signs reviewed, Patient's Cardiovascular Status Stable, Respiratory Function Stable, Patent Airway and No signs of Nausea or vomiting  Post-op Vital Signs: Reviewed and stable  Complications: No apparent anesthesia complications

## 2012-10-28 ENCOUNTER — Other Ambulatory Visit: Payer: Self-pay | Admitting: Internal Medicine

## 2012-10-28 ENCOUNTER — Encounter (HOSPITAL_COMMUNITY): Payer: Self-pay | Admitting: General Practice

## 2012-10-28 LAB — CBC
HCT: 34.7 % — ABNORMAL LOW (ref 39.0–52.0)
MCV: 83.8 fL (ref 78.0–100.0)
RBC: 4.14 MIL/uL — ABNORMAL LOW (ref 4.22–5.81)
WBC: 8 10*3/uL (ref 4.0–10.5)

## 2012-10-28 LAB — GLUCOSE, CAPILLARY: Glucose-Capillary: 153 mg/dL — ABNORMAL HIGH (ref 70–99)

## 2012-10-28 LAB — BASIC METABOLIC PANEL
BUN: 11 mg/dL (ref 6–23)
CO2: 28 mEq/L (ref 19–32)
Chloride: 99 mEq/L (ref 96–112)
Creatinine, Ser: 0.97 mg/dL (ref 0.50–1.35)
Glucose, Bld: 173 mg/dL — ABNORMAL HIGH (ref 70–99)

## 2012-10-28 MED ORDER — WARFARIN SODIUM 5 MG PO TABS
5.0000 mg | ORAL_TABLET | Freq: Once | ORAL | Status: AC
  Administered 2012-10-28: 5 mg via ORAL
  Filled 2012-10-28: qty 1

## 2012-10-28 NOTE — Care Management Note (Signed)
CARE MANAGEMENT NOTE 10/28/2012  Patient:  Christian Erickson, Christian Erickson   Account Number:  [id number]  Date Initiated:  10/28/2012  Documentation initiated by:  Vance Peper  Subjective/Objective Assessment:   79 yr old male s/p right total knee arthroplasty.     Action/Plan:   patient is for shortterm rehab at [facility]. Social worker is aware.   Anticipated DC Date:  10/30/2012   Anticipated DC Plan:  SKILLED NURSING FACILITY  In-house referral  Clinical Social Worker      DC Planning Services  CM consult      Choice offered to / List presented to:             Status of service:  Completed, signed off Medicare Important Message given?   (If response is "NO", the following Medicare IM given date fields will be blank) Date Medicare IM given:   Date Additional Medicare IM given:    Discharge Disposition:  SKILLED NURSING FACILITY  Per UR Regulation:    If discussed at Long Length of Stay Meetings, dates discussed:    Comments:

## 2012-10-28 NOTE — Plan of Care (Signed)
Problem: Consults Goal: Diagnosis- Total Joint Replacement Primary Total Knee Right     

## 2012-10-28 NOTE — Progress Notes (Signed)
UR COMPLETED  

## 2012-10-28 NOTE — Progress Notes (Signed)
Subjective: Doing well.  Pain controlled.    Objective: Vital signs in last 24 hours: Temp:  [97.1 F (36.2 C)-98.4 F (36.9 C)] 97.8 F (36.6 C) (04/24 0545) Pulse Rate:  [66-87] 76 (04/24 0545) Resp:  [9-25] 16 (04/24 0545) BP: (130-174)/(51-97) 174/65 mmHg (04/24 0545) SpO2:  [94 %-100 %] 100 % (04/24 0545)  Intake/Output from previous day: 04/23 0701 - 04/24 0700 In: 1832.5 [I.V.:1832.5] Out: 3200 [Urine:2500; Drains:650; Blood:50] Intake/Output this shift:     Recent Labs  10/27/12 1351 10/28/12 0620  HGB 13.8 12.3*    Recent Labs  10/27/12 1351 10/28/12 0620  WBC 6.9 8.0  RBC 4.76 4.14*  HCT 40.1 34.7*  PLT 118* 115*    Recent Labs  10/27/12 1604 10/28/12 0620  NA  --  134*  K  --  4.2  CL  --  99  CO2  --  28  BUN  --  11  CREATININE 0.95 0.97  GLUCOSE  --  173*  CALCIUM  --  8.4    Recent Labs  10/28/12 0620  INR 1.13    Exam:  Dressing c/d/i.  Calf nt nvi.    Assessment/Plan: Will need short snf placement at [facility].  D/c foley and dilaudid.  Start PT.     Zonia Kief M 10/28/2012, 8:28 AM

## 2012-10-28 NOTE — Evaluation (Signed)
Occupational Therapy Evaluation Patient Details Name: Christian Erickson MRN: [id number] DOB: Nov 29, 1930 Today's Date: 10/28/2012 Time: 4098-1191 OT Time Calculation (min): 39 min  OT Assessment / Plan / Recommendation Clinical Impression  Pt demos decline in function with balance, safety, activity tolerance, ADLs  and strength following R TKA. Pt demos decreased safety awareness requires cues. Pt would benefit from OT services to address inpairments to help restore PLOF    OT Assessment  Patient needs continued OT Services    Follow Up Recommendations  SNF;Supervision/Assistance - 24 hour    Barriers to Discharge  Pt/family would like SNF for further care after d/c, wife would not be able to provide adequate assist at home and [facility] therapy not enough    Equipment Recommendations  3 in 1 bedside comode;Other (comment) (TBD at SNF level of care)    Recommendations for Other Services    Frequency  Min 2X/week    Precautions / Restrictions Precautions Precautions: Knee;Fall Precaution Booklet Issued: Yes (comment) Precaution Comments: Reviewed ank;e pumps and quad sets on exercise sheet Required Braces or Orthoses: Knee Immobilizer - Right Knee Immobilizer - Right: On except when in CPM Restrictions Weight Bearing Restrictions: Yes RLE Weight Bearing: Weight bearing as tolerated   Pertinent Vitals/Pain     ADL  Grooming: Performed;Wash/dry hands;Wash/dry face;Min guard Where Assessed - Grooming: Supported standing Upper Body Bathing: Performed;Supervision/safety;Set up Lower Body Bathing: Simulated;+1 Total assistance Upper Body Dressing: Performed;Supervision/safety;Set up Lower Body Dressing: Performed;+1 Total assistance Toilet Transfer: Performed;Moderate assistance Toilet Transfer Method: Sit to stand Toilet Transfer Equipment: Raised toilet seat with arms (or 3-in-1 over toilet) Toileting - Clothing Manipulation and Hygiene: Performed;Maximal assistance Equipment Used:  Rolling walker;Gait belt;Knee Immobilizer Transfers/Ambulation Related to ADLs: Pt required moderate verbal and physical cues for safety using RW, correct hand placement, technique and sequencing    OT Diagnosis: Generalized weakness;Acute pain  OT Problem List: Decreased strength;Decreased knowledge of use of DME or AE;Decreased knowledge of precautions;Decreased activity tolerance;Decreased safety awareness;Impaired balance (sitting and/or standing);Pain OT Treatment Interventions: Self-care/ADL training;Balance training;Therapeutic exercise;Neuromuscular education;Therapeutic activities;DME and/or AE instruction;Patient/family education   OT Goals Acute Rehab OT Goals OT Goal Formulation: With patient/family Time For Goal Achievement: 11/04/12 Potential to Achieve Goals: Good ADL Goals Pt Will Perform Grooming: with set-up;with supervision;Standing at sink;Supported ADL Goal: Grooming - Progress: Goal set today Pt Will Perform Lower Body Bathing: with mod assist;with max assist ADL Goal: Lower Body Bathing - Progress: Goal set today Pt Will Perform Lower Body Dressing: with mod assist;with max assist ADL Goal: Lower Body Dressing - Progress: Goal set today Pt Will Transfer to Toilet: with supervision;with DME;Grab bars ADL Goal: Toilet Transfer - Progress: Goal set today Pt Will Perform Toileting - Clothing Manipulation: with min assist;with mod assist;Standing ADL Goal: Toileting - Clothing Manipulation - Progress: Goal set today Pt Will Perform Toileting - Hygiene: with min assist;with supervision;Sitting on 3-in-1 or toilet;Standing at 3-in-1/toilet ADL Goal: Toileting - Hygiene - Progress: Goal set today  Visit Information  Last OT Received On: 10/28/12 Assistance Needed: +2 PT/OT Co-Evaluation/Treatment: Yes    Subjective Data  Subjective: " I plan to go to [facility] for rehab after I leave here " Patient Stated Goal: To return home afer therapy at SNF   Prior  Functioning     Home Living Lives With: Spouse Available Help at Discharge: Skilled Nursing Facility Type of Home: House Home Access: Stairs to enter Secretary/administrator of Steps: 7 Entrance Stairs-Rails: Right;Left Home Layout: One level Bathroom Shower/Tub: Walk-in  shower Bathroom Toilet: Standard Bathroom Accessibility: Yes How Accessible: Accessible via walker;Accessible via wheelchair Home Adaptive Equipment: Straight cane;Walker - rolling;Shower chair with back;Other (comment) (toilet seat riser with handles) Additional Comments: pt and wife unsure if walker is standard or rolling Prior Function Level of Independence: Independent Able to Take Stairs?: Yes Driving: Yes Vocation: Retired Musician: No difficulties Dominant Hand: Right         Vision/Perception Vision - History Baseline Vision: Wears glasses only for reading Patient Visual Report: No change from baseline Perception Perception: Within Functional Limits   Cognition  Cognition Arousal/Alertness: Awake/alert Behavior During Therapy: WFL for tasks assessed/performed Overall Cognitive Status: Within Functional Limits for tasks assessed Area of Impairment: Memory;Safety/judgement;Awareness;Problem solving Memory: Decreased short-term memory Safety/Judgement: Decreased awareness of safety;Decreased awareness of deficits General Comments: Pt requires mod verbal and physical cues for correct hand placement, sequencing, direction and technique during functional mobility ad wife states that memory has become an issue at home    Extremity/Trunk Assessment Right Upper Extremity Assessment RUE ROM/Strength/Tone: [facility] for tasks assessed RUE Sensation: WFL - Light Touch RUE Coordination: WFL - gross/fine motor Left Upper Extremity Assessment LUE ROM/Strength/Tone: WFL for tasks assessed LUE Sensation: WFL - Light Touch LUE Coordination: WFL - gross/fine motor      Mobility Bed  Mobility Bed Mobility: Supine to Sit;Sitting - Scoot to Edge of Bed Supine to Sit: 3: Mod assist Sitting - Scoot to Edge of Bed: 4: Min assist Details for Bed Mobility Assistance: needed ongoing/frequent  cues to complete tasks, mod assist to elevate shoulders and manage R LE Transfers Transfers: Sit to Stand;Stand to Sit Sit to Stand: 1: +2 Total assist;From bed;With upper extremity assist Sit to Stand: Patient Percentage: 60% Stand to Sit: 3: Mod assist;With armrests;To chair/3-in-1 Details for Transfer Assistance: frequent and ongoing cues for each step of tasks, physical assist needed to rise to stand and control descent        Balance Balance Balance Assessed: No   End of Session OT - End of Session Equipment Utilized During Treatment: Gait belt;Other (comment);Right knee immobilizer (RW, 3 in 1) Activity Tolerance: Patient tolerated treatment well Patient left: in chair;with call bell/phone within reach;with family/visitor present CPM Right Knee CPM Right Knee: Off Right Knee Extension (Degrees): 0  GO     Margaretmary Eddy [facility] 10/28/2012, 11:43 AM

## 2012-10-28 NOTE — Clinical Social Work Psychosocial (Signed)
Clinical Social Work Department  BRIEF PSYCHOSOCIAL ASSESSMENT  Patient:Christian Erickson  Account Number: [id number]   Admit date: 10/27/12  Clinical Social Worker Sabino Niemann, MSW Date/Time: 10/28/2012 3:33 PM Referred by: Physician Date Referred: 10/27/12 Referred for   SNF Placement   Other Referral:  Interview type: Patient  And patient's wife Other interview type: PSYCHOSOCIAL DATA  Living Status: Spouse Admitted from facility:  Level of care:  Primary support name: Christian Erickson,Christian Erickson Primary support relationship to patient:  Wife Degree of support available:  Strong and vested  CURRENT CONCERNS  Current Concerns   Post-Acute Placement   Other Concerns:  SOCIAL WORK ASSESSMENT / PLAN  CSW met with pt and patient's wife re: PT recommendation for SNF.   Pt lives with her spouse  CSW explained placement process and answered questions.   Pt reports [facility]  as his preference    CSW completed FL2 and initiated SNF search.     Assessment/plan status: Information/Referral to [facility]  Other assessment/ plan:  Information/referral to community resources:  SNF   PTAR  PATIENT'S/FAMILY'S RESPONSE TO PLAN OF CARE:  Pt  reports he is agreeable to ST SNF in order to increase strength and independence with mobility prior to returning home  Pt verbalized understanding of placement process and appreciation for CSW assist.   Sabino Niemann, MSW [phone]

## 2012-10-28 NOTE — Progress Notes (Signed)
ANTICOAGULATION CONSULT NOTE - Follow Up Consult  Pharmacy Consult for coumadin  Indication: VTE prophylaxis  Allergies  Allergen Reactions  . Beef-Derived Products Swelling  . Pork-Derived Products Swelling  . Vasotec     Angioedema, see OV 09-05-11   Vital Signs: Temp: 97.8 F (36.6 C) (04/24 0545) BP: 174/65 mmHg (04/24 0545) Pulse Rate: 76 (04/24 0545)  Labs:  Recent Labs  10/27/12 1351 10/27/12 1604 10/28/12 0620  HGB 13.8  --  12.3*  HCT 40.1  --  34.7*  PLT 118*  --  115*  LABPROT  --   --  14.3  INR  --   --  1.13  CREATININE  --  0.95 0.97    The CrCl is unknown because both a height and weight (above a minimum accepted value) are required for this calculation.  Assessment: Patient is an 79 y.o s/p right TKA on coumadin for VTE prophylaxis.  INR is subtherapeutic at 1.13.  No bleeding noted.  Goal of Therapy:  INR 2-3 Monitor platelets by anticoagulation protocol: Yes   Plan:  1) coumadin 5mg  PO x1   Lavone Barrientes P 10/28/2012,11:40 AM

## 2012-10-28 NOTE — Clinical Social Work Placement (Signed)
Clinical Social Work Department  CLINICAL SOCIAL WORK PLACEMENT NOTE  10/28/2012  Patient Christian Erickson: ccount Number:  [id number] Admit date: 10/27/12  Clinical Social Worker: Sabino Niemann MSW Date/time: 10/28/2012 3:30 AM  Clinical Social Work is seeking post-discharge placement for this patient at the following level of care: SKILLED NURSING (*CSW will update this form in Epic as items are completed)  4/24/2014Patient/family provided with [facility] Department of Clinical Social Work's list of facilities offering this level of care within the geographic area requested by the patient (or if unable, by the patient's family).  10/28/2012 Patient/family informed of their freedom to choose among providers that offer the needed level of care, that participate in Medicare, Medicaid or managed care program needed by the patient, have an available bed and are willing to accept the patient.  4/24/2014Patient/family informed of [facility]' ownership interest in [facility], as well as of the fact that they are under no obligation to receive care at this facility.  PASARR submitted to EDS on  PASARR number received from EDS on  FL2 transmitted to all facilities in geographic area requested by pt/family on 10/28/2012  FL2 transmitted to all facilities within larger geographic area on  Patient informed that his/her managed care company has contracts with or will negotiate with certain facilities, including the following:  Patient/family informed of bed offers received:  Patient chooses bed at  Physician recommends and patient chooses bed at  Patient to be transferred to on  Patient to be transferred to facility by  The following physician request were entered in Epic:  Additional Comments:  Patient Pre-reg at [facility]

## 2012-10-28 NOTE — Telephone Encounter (Signed)
Refill done.  

## 2012-10-28 NOTE — Plan of Care (Signed)
Problem: Phase II Progression Outcomes Goal: Discharge plan established Recommend SNF for further therapy needs after acute care d/c     

## 2012-10-28 NOTE — Evaluation (Signed)
Physical Therapy Evaluation Patient Details Name: Christian Erickson MRN: [id number] DOB: [birth year] Today's Date: 10/28/2012 Time: 0865-7846 PT Time Calculation (min): 38 min  PT Assessment / Plan / Recommendation Clinical Impression  Pt. Presents  s/p modified minimally invasive R TKA with a decreased level of funcitonal mobility , gait , ROM and strength and will need acute PT to maximixe his independence.  Plans are for transfer to [facility] for further rehab .    PT Assessment  Patient needs continued PT services    Follow Up Recommendations       Does the patient have the potential to tolerate intense rehabilitation      Barriers to Discharge None      Equipment Recommendations       Recommendations for Other Services     Frequency 7X/week    Precautions / Restrictions Precautions Precautions: Knee;Fall Precaution Booklet Issued: Yes (comment) Precaution Comments: Reviewed ank;e pumps and quad sets on exercise sheet Required Braces or Orthoses: Knee Immobilizer - Right Knee Immobilizer - Right: On except when in CPM Restrictions Weight Bearing Restrictions: Yes RLE Weight Bearing: Weight bearing as tolerated   Pertinent Vitals/Pain See vitals tab      Mobility  Bed Mobility Bed Mobility: Supine to Sit;Sitting - Scoot to Edge of Bed Supine to Sit: 3: Mod assist Sitting - Scoot to Edge of Bed: 4: Min assist Details for Bed Mobility Assistance: needed ongoing/frequent  cues to complete tasks, mod assist to elevate shouilders and manage R LE Transfers Transfers: Sit to Stand;Stand to Sit Sit to Stand: 1: +2 Total assist;From bed;With upper extremity assist Sit to Stand: Patient Percentage: 60% Stand to Sit: 3: Mod assist;With armrests;To chair/3-in-1 Details for Transfer Assistance: frequent and ongoing cues for each step of tasks, physical assist needed to rise to stand and control descent Ambulation/Gait Ambulation/Gait Assistance: 1: +2 Total  assist Ambulation/Gait: Patient Percentage: 70% (second person primarily for managing IV ) Ambulation Distance (Feet): 18 Feet Assistive device: Rolling walker Gait Pattern: Step-through pattern;Decreased step length - right;Decreased step length - left;Decreased stance time - right;Decreased hip/knee flexion - right;Decreased hip/knee flexion - left;Decreased weight shift to right;Trunk flexed Gait velocity: decreased Stairs: No    Exercises Total Joint Exercises Ankle Circles/Pumps: AROM;Right;10 reps;Supine Quad Sets: AROM;Right;5 reps;Supine   PT Diagnosis: Difficulty walking;Abnormality of gait;Acute pain  PT Problem List: Decreased strength;Decreased range of motion;Decreased activity tolerance;Decreased mobility;Decreased knowledge of use of DME;Decreased knowledge of precautions;Pain PT Treatment Interventions: DME instruction;Gait training;Functional mobility training;Therapeutic activities;Therapeutic exercise;Patient/family education   PT Goals Acute Rehab PT Goals PT Goal Formulation: With patient Time For Goal Achievement: 11/04/12 Potential to Achieve Goals: Good Pt will go Supine/Side to Sit: with supervision PT Goal: Supine/Side to Sit - Progress: Goal set today Pt will go Sit to Supine/Side: with supervision PT Goal: Sit to Supine/Side - Progress: Goal set today Pt will go Sit to Stand: with supervision PT Goal: Sit to Stand - Progress: Goal set today Pt will go Stand to Sit: with supervision PT Goal: Stand to Sit - Progress: Goal set today Pt will Transfer Bed to Chair/Chair to Bed: with supervision PT Transfer Goal: Bed to Chair/Chair to Bed - Progress: Goal set today Pt will Ambulate: 51 - 150 feet;with supervision;with rolling walker PT Goal: Ambulate - Progress: Goal set today Pt will Perform Home Exercise Program: with supervision, verbal cues required/provided PT Goal: Perform Home Exercise Program - Progress: Goal set today  Visit Information  Last PT  Received On: 10/28/12  Assistance Needed: +2 PT/OT Co-Evaluation/Treatment: Yes    Subjective Data  Subjective: Pt.'s wife reports pt. with forgetfulness at home Patient Stated Goal: re[facility] at [facility] then home   Prior Functioning  Home Living Lives With: Spouse Available Help at Discharge: Skilled Nursing Facility Type of Home: House Home Access: Stairs to enter Secretary/administrator of Steps: 7 Entrance Stairs-Rails: Right;Left Home Layout: One level Bathroom Shower/Tub: Health visitor: Standard Bathroom Accessibility: Yes How Accessible: Accessible via walker;Accessible via wheelchair Home Adaptive Equipment: Straight cane;Walker - rolling;Shower chair with back;Other (comment) (toilet seat riser with handles) Additional Comments: pt and wife unsure if walker is standard or rolling Prior Function Level of Independence: Independent Able to Take Stairs?: Yes Driving: Yes Vocation: Retired Musician: No difficulties Dominant Hand: Right    Cognition  Cognition Arousal/Alertness: Awake/alert Behavior During Therapy: WFL for tasks assessed/performed Overall Cognitive Status: Within Functional Limits for tasks assessed Area of Impairment: Memory;Safety/judgement;Awareness;Problem solving Memory: Decreased short-term memory Safety/Judgement: Decreased awareness of safety;Decreased awareness of deficits General Comments: Pt requires mod verbal and physical cues for correct hand placement, sequencing, direction and technique during functional mobility ad wife states that memory has become an issue at home    Extremity/Trunk Assessment Right Upper Extremity Assessment RUE ROM/Strength/Tone: [facility] for tasks assessed RUE Sensation: WFL - Light Touch RUE Coordination: WFL - gross/fine motor Left Upper Extremity Assessment LUE ROM/Strength/Tone: WFL for tasks assessed LUE Sensation: WFL - Light Touch LUE Coordination: WFL - gross/fine  motor Right Lower Extremity Assessment RLE ROM/Strength/Tone: Deficits RLE ROM/Strength/Tone Deficits: quad set fair, ankle pump good RLE Sensation: WFL - Light Touch RLE Coordination: WFL - gross motor Left Lower Extremity Assessment LLE ROM/Strength/Tone: WFL for tasks assessed LLE Sensation: WFL - Light Touch LLE Coordination: WFL - gross motor Trunk Assessment Trunk Assessment: Normal   Balance Balance Balance Assessed: No  End of Session PT - End of Session Equipment Utilized During Treatment: Gait belt;Right knee immobilizer Activity Tolerance: Patient tolerated treatment well CPM Right Knee CPM Right Knee: Off Right Knee Extension (Degrees): 0  GP     [name] 10/28/2012, 10:46 AM Weldon Picking PT Acute Rehab Services [phone] Beeper [phone]

## 2012-10-28 NOTE — Op Note (Signed)
NAME:  Christian Erickson, Christian Erickson NO.:  [id number]  MEDICAL RECORD NO.:  [id number]  LOCATION:  [id number]                        FACILITY:  [facility]  PHYSICIAN:  Loreta Ave, M.D. DATE OF BIRTH:  1930/12/07  DATE OF PROCEDURE:  10/27/2012 DATE OF DISCHARGE:                              OPERATIVE REPORT   PREOPERATIVE DIAGNOSIS:  Right knee end-stage degenerative arthritis, varus alignment.  POSTOPERATIVE DIAGNOSIS:  Right knee end-stage degenerative arthritis, varus alignment.  PROCEDURE:  Modified minimally invasive right total knee replacement, Stryker triathlon prosthesis.  Cemented pegged posterior stabilized #5 femoral component.  Cemented #5 tibial component, 11 mm polyethylene insert.  A 38-mm resurfacing patella.  Soft tissue balancing.  SURGEON:  Loreta Ave, M.D.  ASSISTANT:  Genene Churn. Barry Dienes, [place], present throughout the entire case and necessary for timely completion of the procedure.  ANESTHESIA:  General.  ESTIMATED BLOOD LOSS:  Minimal.  SPECIMENS:  None.  CULTURES:  None.  COMPLICATION:  None.  DRESSING:  Sterile compressive knee immobilizer.  DRAINS:  Hemovac x1.  TOURNIQUET TIME:  50 minutes.  PROCEDURE IN DETAIL:  The patient was brought to the operating room and placed on the operating table in supine position.  After adequate anesthesia had been obtained, the knee examined.  Mild flexion contracture.  A 5 degrees fixed varus.  Flexion of 100.  Tourniquet applied.  Prepped and draped in the usual sterile fashion. Exsanguinated with elevation of Esmarch, tourniquet inflated to 350 mmHg.  Straight incision above the patella down the tibial tubercle. Hemostasis with cautery.  Medial arthrotomy, vastus splitting, preserving quad tendon.  Knee exposed.  Grade 4 change throughout. Medial capsular release.  Loose bodies remnants of menisci, cruciate ligaments, spurs removed.  Distal femur exposed.  Intramedullary guide placed.  A 10-mm  resection 5 degrees of valgus.  Using epicondylar axis, the femur was sized, cut, and fitted for posterior stabilized #5 component.  Proximal tibial resection below the defect medially. Extramedullary guide.  A 3-degree posterior slope cut.  Sized to #5 component.  Patella exposed.  Posterior 10 mm removed.  Drilled, sized, and fitted for a 38-mm component.  The knee was cleared throughout. Nice to balance in flexion extension.  Trials put in place.  Excellent by mechanical axis, full motion, nicely balanced in flexion extension and good patellar tracking.  All confirmed.  Tibia was marked for rotation and reamed.  All trials have been removed.  Copious irrigation with pulse irrigating device.  Cement prepared, placed on all components, firmly seated.  Polyethylene attached to tibia knee reduced. Patella held with clamp.  Once cement hardened, the wound was irrigated again.  Hemovac was placed and brought out through a separate stab wound.  Arthrotomy closed with #1 Vicryl.  Skin and subcutaneous tissue with Vicryl and staples.  Prior to closure, the soft tissues had been injected with Exparel.  Sterile compressive dressing applied. Tourniquet deflated and removed.  Knee immobilizer applied.  Anesthesia reversed.  Brought to the recovery room.  Tolerated the surgery well. No complications.     Loreta Ave, M.D.     DFM/MEDQ  D:  10/27/2012  T:  10/28/2012  Job:  [id number]

## 2012-10-29 LAB — PROTIME-INR
INR: 1.42 (ref 0.00–1.49)
Prothrombin Time: 17 seconds — ABNORMAL HIGH (ref 11.6–15.2)

## 2012-10-29 LAB — GLUCOSE, CAPILLARY
Glucose-Capillary: 168 mg/dL — ABNORMAL HIGH (ref 70–99)
Glucose-Capillary: 179 mg/dL — ABNORMAL HIGH (ref 70–99)
Glucose-Capillary: 131 mg/dL — ABNORMAL HIGH (ref 70–99)
Glucose-Capillary: 161 mg/dL — ABNORMAL HIGH (ref 70–99)

## 2012-10-29 LAB — BASIC METABOLIC PANEL
BUN: 12 mg/dL (ref 6–23)
CO2: 28 mEq/L (ref 19–32)
Chloride: 93 mEq/L — ABNORMAL LOW (ref 96–112)
GFR calc non Af Amer: 75 mL/min — ABNORMAL LOW (ref >90)
Glucose, Bld: 172 mg/dL — ABNORMAL HIGH (ref 70–99)
Potassium: 4.3 mEq/L (ref 3.5–5.1)
Sodium: 129 mEq/L — ABNORMAL LOW (ref 135–145)

## 2012-10-29 LAB — CBC
HCT: 33.1 % — ABNORMAL LOW (ref 39.0–52.0)
Hemoglobin: 11.6 g/dL — ABNORMAL LOW (ref 13.0–17.0)
MCHC: 35 g/dL (ref 30.0–36.0)
RBC: 3.93 MIL/uL — ABNORMAL LOW (ref 4.22–5.81)
WBC: 10.8 10*3/uL — ABNORMAL HIGH (ref 4.0–10.5)

## 2012-10-29 LAB — BASIC METABOLIC PANEL WITH GFR
BUN: 17 mg/dL (ref 6–23)
CO2: 29 meq/L (ref 19–32)
Calcium: 8.7 mg/dL (ref 8.4–10.5)
Chloride: 94 meq/L — ABNORMAL LOW (ref 96–112)
Creatinine, Ser: 1.06 mg/dL (ref 0.50–1.35)
GFR calc Af Amer: 74 mL/min — ABNORMAL LOW
GFR calc non Af Amer: 64 mL/min — ABNORMAL LOW
Glucose, Bld: 149 mg/dL — ABNORMAL HIGH (ref 70–99)
Potassium: 4.6 meq/L (ref 3.5–5.1)
Sodium: 131 meq/L — ABNORMAL LOW (ref 135–145)

## 2012-10-29 MED ORDER — WARFARIN SODIUM 2.5 MG PO TABS
2.5000 mg | ORAL_TABLET | Freq: Once | ORAL | Status: AC
  Administered 2012-10-29: 2.5 mg via ORAL
  Filled 2012-10-29: qty 1

## 2012-10-29 MED ORDER — WARFARIN SODIUM 5 MG PO TABS: 5.0000 mg | ORAL_TABLET | Freq: Every day | ORAL | Status: DC

## 2012-10-29 MED ORDER — METHOCARBAMOL 500 MG PO TABS: 500.0000 mg | ORAL_TABLET | Freq: Four times a day (QID) | ORAL | Status: DC | PRN

## 2012-10-29 MED ORDER — HYDROCODONE-ACETAMINOPHEN 10-325 MG PO TABS: 1.0000 | ORAL_TABLET | ORAL | Status: DC | PRN

## 2012-10-29 MED ORDER — ENOXAPARIN SODIUM 30 MG/0.3ML ~~LOC~~ SOLN: 30.0000 mg | Freq: Two times a day (BID) | SUBCUTANEOUS | Status: DC

## 2012-10-29 MED ORDER — METFORMIN HCL 500 MG PO TABS
500.0000 mg | ORAL_TABLET | Freq: Two times a day (BID) | ORAL | Status: DC
  Administered 2012-10-29 – 2012-10-30 (×2): 500 mg via ORAL
  Filled 2012-10-29 (×5): qty 1

## 2012-10-29 NOTE — Progress Notes (Signed)
Physical Therapy Treatment Patient Details Name: Christian Erickson MRN: [id number] DOB: 09/09/1930 Today's Date: 10/29/2012 Time: 4098-1191 PT Time Calculation (min): 24 min  PT Assessment / Plan / Recommendation Comments on Treatment Session       Follow Up Recommendations        Does the patient have the potential to tolerate intense rehabilitation     Barriers to Discharge        Equipment Recommendations       Recommendations for Other Services    Frequency     Plan      Precautions / Restrictions Precautions Precautions: Knee;Fall Required Braces or Orthoses: Knee Immobilizer - Right Knee Immobilizer - Right: On except when in CPM Restrictions Weight Bearing Restrictions: Yes RLE Weight Bearing: Weight bearing as tolerated   Pertinent Vitals/Pain See vitals tab    Mobility  Bed Mobility Bed Mobility: Sit to Supine Supine to Sit: 1: +2 Total assist;HOB flat Supine to Sit: Patient Percentage: 70% Details for Bed Mobility Assistance: moving easier and more automatically this pm, thought still needs assist to move sit to supine Transfers Transfers: Sit to Stand;Stand to Sit Sit to Stand: 1: +2 Total assist;From chair/3-in-1 Sit to Stand: Patient Percentage: 70% Stand to Sit: 3: Mod assist;To bed Details for Transfer Assistance: following directions better this pm for transitional movements; still with initial posterior lean in standing Ambulation/Gait Ambulation/Gait Assistance: 1: +2 Total assist;Other (comment) (second person to bring recliner behind pt. for safety) Ambulation/Gait: Patient Percentage: 70% Ambulation Distance (Feet): 50 Feet Assistive device: Rolling walker Ambulation/Gait Assistance Details: Pt. with more fluid gait this pm, not as much difficulty advancing feet.  Still needing lots of cueing for correct sequence and RW placement Gait Pattern: Step-through pattern;Decreased step length - right;Decreased step length - left;Trunk flexed     Exercises     PT Diagnosis:    PT Problem List:   PT Treatment Interventions:     PT Goals Acute Rehab PT Goals Pt will go Sit to Supine/Side: with supervision PT Goal: Sit to Supine/Side - Progress: Progressing toward goal Pt will go Sit to Stand: with supervision PT Goal: Sit to Stand - Progress: Progressing toward goal Pt will go Stand to Sit: with supervision PT Goal: Stand to Sit - Progress: Progressing toward goal Pt will Ambulate: 51 - 150 feet;with supervision;with rolling walker PT Goal: Ambulate - Progress: Progressing toward goal  Visit Information  Last PT Received On: 10/29/12 Assistance Needed: +2    Subjective Data  Subjective: Wife reports pt. had rough night last night but seems much better presently   Cognition  Cognition Arousal/Alertness: Awake/alert Behavior During Therapy: WFL for tasks assessed/performed Overall Cognitive Status: Within Functional Limits for tasks assessed (pt. clearer and more appropriate this pm) General Comments: cognition improved this pm; wife present    Balance     End of Session     GP     [name] 10/29/2012, 3:04 PM Weldon Picking PT Acute Rehab Services [phone] Beeper [phone]

## 2012-10-29 NOTE — Progress Notes (Signed)
Physical Therapy Treatment Patient Details Name: Christian Erickson MRN: [id number] DOB: Sep 09, 1930 Today's Date: 10/29/2012 Time: 4098-1191 PT Time Calculation (min): 23 min  PT Assessment / Plan / Recommendation Comments on Treatment Session  Pt. slowly mobilizing with PT, some increased confusion today.  Needs ongoing PT to increase his independence and SNF rehab prior to home.    Follow Up Recommendations  SNF     Does the patient have the potential to tolerate intense rehabilitation     Barriers to Discharge        Equipment Recommendations  None recommended by PT    Recommendations for Other Services    Frequency 7X/week   Plan Discharge plan remains appropriate;Frequency remains appropriate    Precautions / Restrictions Precautions Precautions: Knee;Fall Precaution Comments: educated pt in APs LAQ, QS and knee flexion in sitting Required Braces or Orthoses: Knee Immobilizer - Right Knee Immobilizer - Right: On except when in CPM Restrictions Weight Bearing Restrictions: Yes RLE Weight Bearing: Weight bearing as tolerated   Pertinent Vitals/Pain See vitals tab    Mobility  Bed Mobility Bed Mobility: Not assessed (pt. in recliner chair) Transfers Transfers: Sit to Stand;Stand to Sit Sit to Stand: 1: +2 Total assist;From bed;With upper extremity assist Sit to Stand: Patient Percentage: 60% Stand to Sit: 3: Mod assist;With armrests;To chair/3-in-1 Details for Transfer Assistance: simple directions needed so pt. could follow.  Posterior lean when in standing initially Ambulation/Gait Ambulation/Gait Assistance: 1: +2 Total assist Ambulation/Gait: Patient Percentage: 60% Ambulation Distance (Feet): 20 Feet Assistive device: Rolling walker Ambulation/Gait Assistance Details: pt. needed cues for sequence, walker placement and erect posture.  Physical assist needed to provide safety and stability due to balance issues/ Gait Pattern: Step-through pattern;Decreased step  length - right;Decreased step length - left;Decreased stance time - right;Decreased hip/knee flexion - right;Decreased hip/knee flexion - left;Decreased weight shift to right;Trunk flexed Gait velocity: decreased    Exercises Total Joint Exercises Ankle Circles/Pumps: AROM;Both;10 reps Quad Sets: AROM;Right;5 reps Long Arc Quad: AAROM;AROM;Right;5 reps;Seated Knee Flexion: AAROM;Right;5 reps;Seated Goniometric ROM: near 0 to 70   PT Diagnosis:    PT Problem List:   PT Treatment Interventions:     PT Goals Acute Rehab PT Goals Pt will go Sit to Stand: with supervision PT Goal: Sit to Stand - Progress: Progressing toward goal Pt will go Stand to Sit: with supervision PT Goal: Stand to Sit - Progress: Progressing toward goal Pt will Ambulate: 51 - 150 feet;with supervision;with rolling walker PT Goal: Ambulate - Progress: Progressing toward goal Pt will Perform Home Exercise Program: with supervision, verbal cues required/provided PT Goal: Perform Home Exercise Program - Progress: Progressing toward goal  Visit Information  Last PT Received On: 10/29/12 Assistance Needed: +2    Subjective Data  Subjective: I had trouble with my BPH last night (referring to frequent urination)   Cognition  Cognition Arousal/Alertness: Awake/alert Behavior During Therapy: WFL for tasks assessed/performed Overall Cognitive Status: Within Functional Limits for tasks assessed Area of Impairment: Memory;Safety/judgement;Problem solving General Comments: pt. appears a little more confused this am and needed directions broken down into simple parts    Balance     End of Session PT - End of Session Equipment Utilized During Treatment: Gait belt;Right knee immobilizer Activity Tolerance: Patient tolerated treatment well Patient left: in chair;with call bell/phone within reach;with chair alarm set Nurse Communication: Mobility status;Other (comment) (need for chair alarm due to increased  confusion) CPM Right Knee CPM Right Knee: Off   GP  Christian Erickson 10/29/2012, 10:59 AM Christian Erickson PT Acute Rehab Services [phone] Beeper [phone]

## 2012-10-29 NOTE — Progress Notes (Signed)
ANTICOAGULATION CONSULT NOTE - Follow Up Consult  Pharmacy Consult for coumadin Indication: VTE prophylaxis  Allergies  Allergen Reactions  . Beef-Derived Products Swelling  . Pork-Derived Products Swelling  . Vasotec     Angioedema, see OV 09-05-11    Patient Measurements:   Heparin Dosing Weight:   Vital Signs: Temp: 98 F (36.7 C) (04/25 0519) Temp src: Oral (04/25 0519) BP: 163/67 mmHg (04/25 0519) Pulse Rate: 88 (04/25 0519)  Labs:  Recent Labs  10/27/12 1351 10/27/12 1604 10/28/12 0620 10/29/12 0650  HGB 13.8  --  12.3* 11.6*  HCT 40.1  --  34.7* 33.1*  PLT 118*  --  115* 108*  LABPROT  --   --  14.3 17.0*  INR  --   --  1.13 1.42  CREATININE  --  0.95 0.97 0.99    The CrCl is unknown because both a height and weight (above a minimum accepted value) are required for this calculation.   Medications:  Scheduled:  . amLODipine  5 mg Oral Daily  . atorvastatin  20 mg Oral Daily  . bimatoprost  1 drop Both Eyes QHS  . bupivacaine liposome  20 mL Infiltration Once  . coumadin book   Does not apply Once  . docusate sodium  100 mg Oral BID  . doxazosin  2 mg Oral QHS  . enoxaparin (LOVENOX) injection  30 mg Subcutaneous Q12H  . insulin aspart  0-15 Units Subcutaneous TID WC  . linagliptin  5 mg Oral Daily  . metFORMIN  500 mg Oral BID WC  . tamsulosin  0.4 mg Oral Daily  . [COMPLETED] warfarin  5 mg Oral ONCE-1800  . [COMPLETED] warfarin   Does not apply Once  . Warfarin - Pharmacist Dosing Inpatient   Does not apply q1800  . [DISCONTINUED] metFORMIN  1,000 mg Oral BID WC   Infusions:  . 0.9 % NaCl with KCl 20 mEq / L 20 mL/hr at 10/28/12 0747  . lactated ringers 50 mL/hr at 10/27/12 1610    Assessment: 79 yo male s/p TKA is currently on subtherapeutic coumadin for VTE prophylaxis.  INR is up to 1.42 from 1.13.  Also on lovenox 30mg  sq q12h. Goal of Therapy:  INR 2-3 Monitor platelets by anticoagulation protocol: Yes   Plan:  1) Coumadin 2.5mg  po  x1 2) INR in am 3) d/c lovenox when INR >/= 1.8  Ichelle Harral, Tsz-Yin 10/29/2012,8:40 AM

## 2012-10-29 NOTE — Progress Notes (Signed)
0430-- Pt pulled out hemovac(says he doesn't know how it happened). Drsg reinforced and ace wrap applied.

## 2012-10-29 NOTE — Discharge Summary (Signed)
Physician Discharge Summary  Patient ID: Christian Erickson MRN: [id number] DOB/AGE: 77-Jul-1932 77 y.o.  Admit date: 10/27/2012 Discharge date: 10/29/2012  Admission Diagnoses: Right knee djd  Discharge Diagnoses:  Right total knee replacement  Discharged Condition: good  Hospital Course: 79 yo bm with hx of end stage djd right knee and pain was taken to the OR 23 April for sched total knee replacement.  Tolerated surgery well and without complication.  Transferred to the ortho unit and protocol lovenox and coumadin started for dvt prophylaxis.  24 April, doing well.  Dressing c/d/i.  Calf nt, nvi.  Started PT.  Arranging transfer to snf for rehab.  25 April, doing well.  Knee wound looked good and staples intact.  No drainage or signs of infection.  Calf nt, nvi.  Ready for transfer to [facility] when bed available.    Consults: None   Discharge Exam: Blood pressure 163/90, pulse 88, temperature 98 F (36.7 C), temperature source Oral, resp. rate 16, SpO2 100.00%.   Disposition: [facility]  Discharge Orders   Future Appointments Provider Department Dept Phone   01/21/2013 9:30 AM Wanda Plump, MD [facility]  [place] [phone]   Future Orders Complete By Expires     CPM  As directed     Comments:      Continuous passive motion machine (CPM):      Use the CPM from 0 to 70 degrees for 6-8 hours per day.      You may increase by 10 degrees per day as tolerated.  You may break it up into 2 or 3 sessions per day.      Use CPM for 3-4 weeks or until you are told to stop.    Call MD / Call 911  As directed     Comments:      If you experience chest pain or shortness of breath, CALL 911 and be transported to the hospital emergency room.  If you develope a fever above 101 F, pus (white drainage) or increased drainage or redness at the wound, or calf pain, call your surgeon's office.    Change dressing  As directed     Comments:      Change dressing on right  knee daily with sterile 4 x 4 inch gauze dressing and apply TED hose.    Constipation Prevention  As directed     Comments:      Drink plenty of fluids.  Prune juice may be helpful.  You may use a stool softener, such as Colace (over the counter) 100 mg twice a day.  Use MiraLax (over the counter) for constipation as needed.    Diet - low sodium heart healthy  As directed     Discharge instructions  As directed     Comments:      Ok to shower, but no tub soaking.  Do not apply any creams or ointments to incision.  Continue physical therapy protocol.    Do not put a pillow under the knee. Place it under the heel.  As directed     Driving restrictions  As directed     Comments:      No driving until further notice.    Increase activity slowly as tolerated  As directed     TED hose  As directed     Comments:      Use stockings (TED hose) for 3-4 weeks on both leg(s).  You may remove them at  night for sleeping.        Medication List    STOP taking these medications       aspirin 81 MG tablet     multivitamin with minerals Tabs     vardenafil 10 MG tablet  Commonly known as:  LEVITRA      TAKE these medications       amLODipine 5 MG tablet  Commonly known as:  NORVASC  Take 5 mg by mouth daily.     atorvastatin 20 MG tablet  Commonly known as:  LIPITOR  Take 1 tablet (20 mg total) by mouth daily.     bimatoprost 0.03 % ophthalmic solution  Commonly known as:  LUMIGAN  1 drop at bedtime.     doxazosin 2 MG tablet  Commonly known as:  CARDURA  Take 2 mg by mouth at bedtime.     enoxaparin 30 MG/0.3ML injection  Commonly known as:  LOVENOX  Inject 0.3 mLs (30 mg total) into the skin every 12 (twelve) hours.     fluticasone 50 MCG/ACT nasal spray  Commonly known as:  FLONASE  Place 2 sprays into the nose as needed for allergies.     HYDROcodone-acetaminophen 10-325 MG per tablet  Commonly known as:  NORCO  Take 1-2 tablets by mouth every 4 (four) hours as needed  (breakthrough pain).     metFORMIN 1000 MG tablet  Commonly known as:  GLUCOPHAGE  TAKE 1 TABLET TWICE A DAY WITH MEALS     methocarbamol 500 MG tablet  Commonly known as:  ROBAXIN  Take 1 tablet (500 mg total) by mouth every 6 (six) hours as needed (spasms).     ONE TOUCH LANCETS Misc  Check once daily.  Dx:250.00    One touch ultra     saxagliptin HCl 2.5 MG Tabs tablet  Commonly known as:  ONGLYZA  Take 2 tablets (5 mg total) by mouth daily. Two tablets together once a day     tamsulosin 0.4 MG Caps  Commonly known as:  FLOMAX  Take 1 capsule (0.4 mg total) by mouth daily.     warfarin 5 MG tablet  Commonly known as:  COUMADIN  Take 1 tablet (5 mg total) by mouth daily.           Follow-up Information   Follow up with Loreta Ave, MD. (need return office visit 2 weeks postop)    Contact information:   [address] [place] [address] [phone]       Signed: Naida Sleight 10/29/2012, 1:24 PM

## 2012-10-29 NOTE — Progress Notes (Signed)
Subjective: Doing well.  Pain controlled.     Objective: Vital signs in last 24 hours: Temp:  [98 F (36.7 C)-99.2 F (37.3 C)] 98 F (36.7 C) (04/25 0519) Pulse Rate:  [84-94] 88 (04/25 0519) Resp:  [16-18] 16 (04/25 0519) BP: (160-169)/(64-90) 163/90 mmHg (04/25 1049) SpO2:  [96 %-100 %] 100 % (04/25 0519)  Intake/Output from previous day: 04/24 0701 - 04/25 0700 In: 720 [P.O.:720] Out: 1225 [Urine:975; Drains:250] Intake/Output this shift:     Recent Labs  10/27/12 1351 10/28/12 0620 10/29/12 0650  HGB 13.8 12.3* 11.6*    Recent Labs  10/28/12 0620 10/29/12 0650  WBC 8.0 10.8*  RBC 4.14* 3.93*  HCT 34.7* 33.1*  PLT 115* 108*    Recent Labs  10/28/12 0620 10/29/12 0650  NA 134* 129*  K 4.2 4.3  CL 99 93*  CO2 28 28  BUN 11 12  CREATININE 0.97 0.99  GLUCOSE 173* 172*  CALCIUM 8.4 9.0    Recent Labs  10/28/12 0620 10/29/12 0650  INR 1.13 1.42    Exam:  Wound looks good.  Staples intact.  No drainage or signs of infection.  Calf nt, nvi.  Drain came out last night.    Assessment/Plan: Transfer to [facility] when bed available.  D/c iv.  Hyponatremia.  Will repeat bmet.     Charmelle Soh M 10/29/2012, 1:30 PM

## 2012-10-30 LAB — BASIC METABOLIC PANEL
BUN: 14 mg/dL (ref 6–23)
CO2: 30 mEq/L (ref 19–32)
Calcium: 8.9 mg/dL (ref 8.4–10.5)
Creatinine, Ser: 0.97 mg/dL (ref 0.50–1.35)
Glucose, Bld: 172 mg/dL — ABNORMAL HIGH (ref 70–99)
Sodium: 133 mEq/L — ABNORMAL LOW (ref 135–145)

## 2012-10-30 LAB — CBC
HCT: 28.8 % — ABNORMAL LOW (ref 39.0–52.0)
Hemoglobin: 10.2 g/dL — ABNORMAL LOW (ref 13.0–17.0)
MCH: 29.6 pg (ref 26.0–34.0)
MCV: 83.5 fL (ref 78.0–100.0)
RBC: 3.45 MIL/uL — ABNORMAL LOW (ref 4.22–5.81)

## 2012-10-30 NOTE — Progress Notes (Signed)
Physical Therapy Treatment Patient Details Name: MELISSA PULIDO MRN: [id number] DOB: Mar 06, 1931 Today's Date: 10/30/2012 Time: 4098-1191 PT Time Calculation (min): 31 min  PT Assessment / Plan / Recommendation Comments on Treatment Session       Follow Up Recommendations  SNF     Does the patient have the potential to tolerate intense rehabilitation     Barriers to Discharge        Equipment Recommendations  None recommended by PT    Recommendations for Other Services    Frequency 7X/week   Plan Discharge plan remains appropriate;Frequency remains appropriate    Precautions / Restrictions Precautions Precautions: Knee;Fall Required Braces or Orthoses: Knee Immobilizer - Right Knee Immobilizer - Right: On except when in CPM Restrictions RLE Weight Bearing: Weight bearing as tolerated       Mobility  Bed Mobility Bed Mobility: Supine to Sit;Sitting - Scoot to Edge of Bed Supine to Sit: 3: Mod assist;HOB elevated;With rails Sitting - Scoot to Edge of Bed: 4: Min guard Details for Bed Mobility Assistance: Max directional cues for sequencing & technique.  (A) for LE management & to lift shoulders/trunk to sitting upright.   Transfers Transfers: Sit to Stand;Stand to Sit Sit to Stand: 4: Min assist;With upper extremity assist;From bed Stand to Sit: 4: Min assist;With upper extremity assist;With armrests;To chair/3-in-1 Details for Transfer Assistance: Cues for hand placement & technique.  (A) to achieve standing, balance, & controlled descent.   Ambulation/Gait Ambulation/Gait Assistance: 4: Min assist Ambulation Distance (Feet): 130 Feet Assistive device: Rolling walker Ambulation/Gait Assistance Details: On-going cues for sequencing, body positioning inside RW, & safe navigation of RW.     Gait Pattern: Step-through pattern;Decreased stride length;Trunk flexed;Decreased weight shift to right Stairs: No Wheelchair Mobility Wheelchair Mobility: No     PT Goals Acute  Rehab PT Goals Time For Goal Achievement: 11/04/12 Potential to Achieve Goals: Good Pt will go Supine/Side to Sit: with supervision PT Goal: Supine/Side to Sit - Progress: Progressing toward goal Pt will go Sit to Supine/Side: with supervision Pt will go Sit to Stand: with supervision PT Goal: Sit to Stand - Progress: Progressing toward goal Pt will go Stand to Sit: with supervision PT Goal: Stand to Sit - Progress: Progressing toward goal Pt will Transfer Bed to Chair/Chair to Bed: with supervision Pt will Ambulate: 51 - 150 feet;with supervision;with rolling walker PT Goal: Ambulate - Progress: Progressing toward goal Pt will Perform Home Exercise Program: with supervision, verbal cues required/provided  Visit Information  Last PT Received On: 10/30/12 Assistance Needed: +2 (safety)    Subjective Data      Cognition  Cognition Arousal/Alertness: Awake/alert Behavior During Therapy: WFL for tasks assessed/performed Overall Cognitive Status: Impaired/Different from baseline    Balance     End of Session PT - End of Session Equipment Utilized During Treatment: Gait belt;Right knee immobilizer Activity Tolerance: Patient tolerated treatment well Patient left: in chair;with call bell/phone within reach;with chair alarm set Nurse Communication: Mobility status     Verdell Face, [place] [phone] 10/30/2012

## 2012-10-30 NOTE — Progress Notes (Signed)
[facility] Progress Note   3 Days Post-Op  Subjective   Doing great, no complaints Ready to go to SNF   Objective  BP 134/93  Pulse 92  Temp(Src) 98 F (36.7 C) (Oral)  Resp 16  SpO2 97%  Patient Vitals for the past 24 hrs:  BP Temp Pulse Resp SpO2  10/30/12 0559 134/93 mmHg 98 F (36.7 C) 92 16 97 %  10/29/12 2100 166/55 mmHg 98.7 F (37.1 C) 92 17 100 %  10/29/12 1407 163/50 mmHg 99.3 F (37.4 C) 106 18 99 %  10/29/12 1049 163/90 mmHg - - - -    Intake/Output     04/25 0701 - 04/26 0700 04/26 0701 - 04/27 0700   P.O. 1080    Total Intake 1080     Urine 950    Drains     Total Output 950     Net +130            Labs Results for MILFERD, ANSELL (MRN [id number]) as of 10/30/2012 08:46  Ref. Range 10/30/2012 06:54  WBC Latest Range: 4.0-10.0 10e3/uL 8.7  RBC Latest Range: 4.22-5.81 MIL/uL 3.45 (L)  Hemoglobin Latest Range: 13.0-17.0 g/dL 40.9 (L)  HCT Latest Range: 39.0-52.0 % 28.8 (L)  MCV Latest Range: 78.0-100.0 fL 83.5  MCH Latest Range: 26.0-34.0 pg 29.6  MCHC Latest Range: 30.0-36.0 g/dL 81.1  RDW Latest Range: 11.5-15.5 % 14.0  Platelets Latest Range: 150-400 K/uL 101 (L)  Prothrombin Time Latest Range: 11.6-15.2 seconds 19.8 (H)  INR Latest Range: 0.00-1.49  1.75 (H)    Exam  Gen: awake and alert, NAD Lungs: clear Cardiac: reg, s1 and s2 Abd: +BS, NT Ext:      Right Lower Extremity    Wound c/d/i  TED hose in place  Swelling stable  No DCT   Compartments soft and nT  DPN, SPN, TN sensation intact  EHL, FHL, AT, PT, Peroneals, gastroc motor intact  + DP pulse  Ext warm    Assessment and Plan  3 Days Post-Op  79 y/o male s/p R TKA for endstage DJD  Pt doing fantastic  D/c to SNF today WBAT R LEx Total knee precautions Ice and elevate TED hose Coumadin for DVT/PE prophylaxis  PT/OT F/u with Dr. Eulah Pont and Fayrene Fearing in 10-14 days, call for appointment  Medical issues stable, continue home meds   Mearl Latin,  PA-C [facility] [phone] (P) 10/30/2012 8:45 AM

## 2012-10-30 NOTE — Progress Notes (Signed)
Coumadin per pharmacy  Anticoagulation: coumadin Rx for VTE px s/p right TKA; also on lovenox 30mg  bid; INR up to 1.75 from 1.42  INR goal 2-3  Plan: 1) Plan for discharge today. Rec to continue coumadin 2.5mg  po qday and have home health recheck INR on Monday to reassess dosing 2) disposition: [facility] 3) d/c lovenox when INR >/= 1.8

## 2012-10-30 NOTE — Progress Notes (Signed)
Pt to be d/c today to [facility].   Pt and family agreeable. Confirmed plans with facility.  Plan transfer via EMS.   Leron Croak, LCSWA [employer] [phone]

## 2012-11-02 ENCOUNTER — Non-Acute Institutional Stay (SKILLED_NURSING_FACILITY): Payer: Medicare Other | Admitting: Internal Medicine

## 2012-11-02 DIAGNOSIS — I10 Essential (primary) hypertension: Secondary | ICD-10-CM

## 2012-11-02 DIAGNOSIS — M1710 Unilateral primary osteoarthritis, unspecified knee: Secondary | ICD-10-CM

## 2012-11-02 DIAGNOSIS — E119 Type 2 diabetes mellitus without complications: Secondary | ICD-10-CM

## 2012-11-02 DIAGNOSIS — J309 Allergic rhinitis, unspecified: Secondary | ICD-10-CM

## 2012-11-05 ENCOUNTER — Telehealth: Payer: Self-pay | Admitting: General Practice

## 2012-11-05 NOTE — Telephone Encounter (Signed)
Pt wife called stating that pt is at [facility] for a knee surgery. Pt has been complaining of frequent urination issues. Offered pt and wife an appt for this afternoon. Due to transportation issues they opted for an appt on Monday afternoon instead.

## 2012-11-08 ENCOUNTER — Ambulatory Visit: Payer: Medicare Other | Admitting: Internal Medicine

## 2012-11-12 ENCOUNTER — Non-Acute Institutional Stay (SKILLED_NURSING_FACILITY): Payer: Medicare Other | Admitting: Adult Health

## 2012-11-12 ENCOUNTER — Encounter: Payer: Self-pay | Admitting: Adult Health

## 2012-11-12 DIAGNOSIS — Z7901 Long term (current) use of anticoagulants: Secondary | ICD-10-CM

## 2012-11-12 DIAGNOSIS — E785 Hyperlipidemia, unspecified: Secondary | ICD-10-CM

## 2012-11-12 DIAGNOSIS — J309 Allergic rhinitis, unspecified: Secondary | ICD-10-CM

## 2012-11-12 DIAGNOSIS — M1990 Unspecified osteoarthritis, unspecified site: Secondary | ICD-10-CM

## 2012-11-12 DIAGNOSIS — H409 Unspecified glaucoma: Secondary | ICD-10-CM

## 2012-11-12 DIAGNOSIS — N400 Enlarged prostate without lower urinary tract symptoms: Secondary | ICD-10-CM

## 2012-11-12 DIAGNOSIS — I10 Essential (primary) hypertension: Secondary | ICD-10-CM

## 2012-11-12 DIAGNOSIS — E119 Type 2 diabetes mellitus without complications: Secondary | ICD-10-CM

## 2012-11-12 NOTE — Progress Notes (Signed)
  Subjective:    Patient ID: Christian Erickson, male    DOB: 22-Oct-1930, 79 y.o.   MRN: [id number]  HPI This is an 79 year old male who is for discharge home with Home health PT, OT, CNA, Social worker and Nursing. DME:  Rolling walker and 3-in-1 commode due to unsteady gait. He has been admitted to [facility] on 10/30/12 from [facility] with Degenerative joint disease S/P right total knee replacement. He has been admitted for a short-term rehabilitation. Latest INR 1.7 -subtherapeutic. Coumadin was held x 3 days due to supratherapeutic INR. He is on Coumadin therapy for DVT prophylaxis S/P surgery.    Review of Systems  Constitutional: Negative.   HENT: Negative.   Eyes: Negative.   Respiratory: Negative for cough and shortness of breath.   Cardiovascular: Negative for leg swelling.  Gastrointestinal: Negative.   Endocrine: Negative.   Genitourinary: Negative.   Neurological: Negative.   Hematological: Negative for adenopathy. Does not bruise/bleed easily.  Psychiatric/Behavioral: Negative.        Objective:   Physical Exam  Constitutional: He is oriented to person, place, and time. He appears well-developed and well-nourished.  HENT:  Head: Normocephalic.  Right Ear: External ear normal.  Left Ear: External ear normal.  Eyes: Conjunctivae are normal. Pupils are equal, round, and reactive to light.  Neck: Normal range of motion. Neck supple.  Cardiovascular: Normal rate, regular rhythm and normal heart sounds.   Pulmonary/Chest: Effort normal and breath sounds normal.  Abdominal: Soft. Bowel sounds are normal.  Musculoskeletal: He exhibits no edema and no tenderness.  Neurological: He is alert and oriented to person, place, and time.  Skin: Skin is warm and dry.  Psychiatric: He has a normal mood and affect. His behavior is normal. Judgment and thought content normal.   Medications reviewed per [facility]       Assessment & Plan:  Long term (current) use of anticoagulants  - start Coumadin 4 mg PO Q D and repeat INR on 11/16/12  BPH (benign prostatic hyperplasia) - stable  DJD (degenerative joint disease) S/P right total knee replacement - for Home health social worker, PT, OT, CNA and Nursing  ALLERGIC RHINITIS - stable  DM  -  well-controlled  GLAUCOMA - stable  HYPERLIPIDEMIA  - stable  HYPERTENSION - well-controlled

## 2012-11-16 ENCOUNTER — Encounter (HOSPITAL_BASED_OUTPATIENT_CLINIC_OR_DEPARTMENT_OTHER): Payer: Self-pay | Admitting: *Deleted

## 2012-11-16 ENCOUNTER — Emergency Department (HOSPITAL_BASED_OUTPATIENT_CLINIC_OR_DEPARTMENT_OTHER): Payer: Medicare Other

## 2012-11-16 ENCOUNTER — Emergency Department (HOSPITAL_BASED_OUTPATIENT_CLINIC_OR_DEPARTMENT_OTHER)
Admission: EM | Admit: 2012-11-16 | Discharge: 2012-11-16 | Disposition: A | Discharge status: Home / Self Care | Payer: Medicare Other | Attending: Emergency Medicine | Admitting: Emergency Medicine

## 2012-11-16 DIAGNOSIS — Z862 Personal history of diseases of the blood and blood-forming organs and certain disorders involving the immune mechanism: Secondary | ICD-10-CM | POA: Insufficient documentation

## 2012-11-16 DIAGNOSIS — Z96659 Presence of unspecified artificial knee joint: Secondary | ICD-10-CM | POA: Insufficient documentation

## 2012-11-16 DIAGNOSIS — Z8739 Personal history of other diseases of the musculoskeletal system and connective tissue: Secondary | ICD-10-CM | POA: Insufficient documentation

## 2012-11-16 DIAGNOSIS — R0602 Shortness of breath: Secondary | ICD-10-CM | POA: Insufficient documentation

## 2012-11-16 DIAGNOSIS — N400 Enlarged prostate without lower urinary tract symptoms: Secondary | ICD-10-CM | POA: Insufficient documentation

## 2012-11-16 DIAGNOSIS — I10 Essential (primary) hypertension: Secondary | ICD-10-CM | POA: Insufficient documentation

## 2012-11-16 DIAGNOSIS — Z8709 Personal history of other diseases of the respiratory system: Secondary | ICD-10-CM | POA: Insufficient documentation

## 2012-11-16 DIAGNOSIS — Z79899 Other long term (current) drug therapy: Secondary | ICD-10-CM | POA: Insufficient documentation

## 2012-11-16 DIAGNOSIS — E119 Type 2 diabetes mellitus without complications: Secondary | ICD-10-CM | POA: Insufficient documentation

## 2012-11-16 DIAGNOSIS — Z87891 Personal history of nicotine dependence: Secondary | ICD-10-CM | POA: Insufficient documentation

## 2012-11-16 DIAGNOSIS — Z8639 Personal history of other endocrine, nutritional and metabolic disease: Secondary | ICD-10-CM | POA: Insufficient documentation

## 2012-11-16 DIAGNOSIS — IMO0002 Reserved for concepts with insufficient information to code with codable children: Secondary | ICD-10-CM | POA: Insufficient documentation

## 2012-11-16 DIAGNOSIS — Z7901 Long term (current) use of anticoagulants: Secondary | ICD-10-CM | POA: Insufficient documentation

## 2012-11-16 DIAGNOSIS — H409 Unspecified glaucoma: Secondary | ICD-10-CM | POA: Insufficient documentation

## 2012-11-16 DIAGNOSIS — R531 Weakness: Secondary | ICD-10-CM

## 2012-11-16 DIAGNOSIS — Z8719 Personal history of other diseases of the digestive system: Secondary | ICD-10-CM | POA: Insufficient documentation

## 2012-11-16 DIAGNOSIS — R5381 Other malaise: Secondary | ICD-10-CM | POA: Insufficient documentation

## 2012-11-16 DIAGNOSIS — E785 Hyperlipidemia, unspecified: Secondary | ICD-10-CM | POA: Insufficient documentation

## 2012-11-16 LAB — COMPREHENSIVE METABOLIC PANEL
ALT: 27 U/L (ref 0–53)
Alkaline Phosphatase: 68 U/L (ref 39–117)
CO2: 23 mEq/L (ref 19–32)
GFR calc Af Amer: 71 mL/min — ABNORMAL LOW (ref >90)
GFR calc non Af Amer: 61 mL/min — ABNORMAL LOW (ref >90)
Glucose, Bld: 189 mg/dL — ABNORMAL HIGH (ref 70–99)
Potassium: 4.2 mEq/L (ref 3.5–5.1)
Sodium: 134 mEq/L — ABNORMAL LOW (ref 135–145)

## 2012-11-16 LAB — CBC WITH DIFFERENTIAL/PLATELET
Eosinophils Relative: 1 % (ref 0–5)
Hemoglobin: 9.2 g/dL — ABNORMAL LOW (ref 13.0–17.0)
Lymphocytes Relative: 8 % — ABNORMAL LOW (ref 12–46)
Lymphs Abs: 0.7 10*3/uL (ref 0.7–4.0)
MCV: 86.6 fL (ref 78.0–100.0)
Platelets: 460 10*3/uL — ABNORMAL HIGH (ref 150–400)
RBC: 3.13 MIL/uL — ABNORMAL LOW (ref 4.22–5.81)
WBC: 8.9 10*3/uL (ref 4.0–10.5)

## 2012-11-16 NOTE — ED Notes (Signed)
Pt recently had right knee replacement  and was discharged from [facility] to home yesterday pt sates he knows he was up and moving more than ususal and with different equipment and different height furniture etc states this am his knee started hurting him and then began to feel winded and somewhat short of breath

## 2012-11-16 NOTE — ED Provider Notes (Signed)
History     CSN: [id number]  Arrival date & time 11/16/12  1238   First MD Initiated Contact with Patient 11/16/12 1245      Chief Complaint  Patient presents with  . Knee Pain    released from rehab post knee surgery yesterday    (Consider location/radiation/quality/duration/timing/severity/associated sxs/prior treatment) HPI Comments: Patient with history of total knee replacement 3 weeks ago.  Was discharged from the hospital into long-term rehab and just went home from there yesterday.  Today was attempted to walk across the room and became short of breath and felt fatigued.  He has to sit down to catch his breath.  This lasted for several minutes, then wife called 911.  He is feeling better now.  He denies any productive cough, fever, or chills.    Patient is a 79 y.o. male presenting with shortness of breath. The history is provided by the patient.  Shortness of Breath Severity:  Moderate Onset quality:  Sudden Duration:  10 minutes Progression:  Resolved Chronicity:  New Context: activity   Relieved by:  Nothing Worsened by:  Nothing tried Ineffective treatments:  None tried   Past Medical History  Diagnosis Date  . BPH (benign prostatic hypertrophy)   . Diabetes mellitus   . Hypertension   . Hyperlipidemia   . Diverticulitis   . ED (erectile dysfunction)   . Glaucoma(365)     sees eye doctor routinely  . Allergic rhinitis   . Subclinical hypothyroidism   . Arthritis   . Blood dyscrasia     Past Surgical History  Procedure Laterality Date  . Inguinal hernia repair    . Colectomy      w/ reversal due to divertiuclitis per patient in 1990s aprox  . Sbo repair    . Total knee arthroplasty Right 10/27/2012    Dr Eulah Pont  . Total knee arthroplasty Right 10/27/2012    Procedure: TOTAL KNEE ARTHROPLASTY;  Surgeon: Loreta Ave, MD;  Location: [facility] OR;  Service: Orthopedics;  Laterality: Right;    Family History  Problem Relation Age of Onset  . Lupus    .  Stroke Brother 47    CVA, no FH premature CAD  . Alzheimer's disease    . Diabetes      granddaughter    History  Substance Use Topics  . Smoking status: Former Smoker    Types: Cigarettes  . Smokeless tobacco: Never Used  . Alcohol Use: No      Review of Systems  Respiratory: Positive for shortness of breath.   All other systems reviewed and are negative.    Allergies  Beef-derived products; Pork-derived products; and Vasotec  Home Medications   Current Outpatient Rx  Name  Route  Sig  Dispense  Refill  . amLODipine (NORVASC) 5 MG tablet   Oral   Take 5 mg by mouth daily.         [name] atorvastatin (LIPITOR) 20 MG tablet   Oral   Take 1 tablet (20 mg total) by mouth daily.   90 tablet   1   . bimatoprost (LUMIGAN) 0.03 % ophthalmic solution      1 drop at bedtime.           [name] doxazosin (CARDURA) 2 MG tablet   Oral   Take 2 mg by mouth at bedtime.         . enoxaparin (LOVENOX) 30 MG/0.3ML injection   Subcutaneous   Inject 0.3 mLs (30 mg total)  into the skin every 12 (twelve) hours.   0 Syringe        STOP WHEN COUMADIN IS THERAPEUTIC WITH INR 2-3.   . fluticasone (FLONASE) 50 MCG/ACT nasal spray   Nasal   Place 2 sprays into the nose as needed for allergies.          [name] HYDROcodone-acetaminophen (NORCO) 10-325 MG per tablet   Oral   Take 1-2 tablets by mouth every 4 (four) hours as needed (breakthrough pain).   30 tablet      . metFORMIN (GLUCOPHAGE) 1000 MG tablet      TAKE 1 TABLET TWICE A DAY WITH MEALS   180 tablet   1   . methocarbamol (ROBAXIN) 500 MG tablet   Oral   Take 1 tablet (500 mg total) by mouth every 6 (six) hours as needed (spasms).         . ONE TOUCH LANCETS MISC      Check once daily. Dx:250.00  One touch ultra   200 each   12   . saxagliptin HCl (ONGLYZA) 2.5 MG TABS tablet   Oral   Take 2 tablets (5 mg total) by mouth daily. Two tablets together once a day   180 tablet   1   . Tamsulosin HCl  (FLOMAX) 0.4 MG CAPS   Oral   Take 1 capsule (0.4 mg total) by mouth daily.   30 capsule   3   . warfarin (COUMADIN) 5 MG tablet   Oral   Take 1 tablet (5 mg total) by mouth daily.           pharmacist will dose per protocol and maintain INR .Marland KitchenMarland Kitchen     BP 177/64  Pulse 82  Temp(Src) 98.4 F (36.9 C) (Oral)  Resp 20  SpO2 100%  Physical Exam  Nursing note and vitals reviewed. Constitutional: He is oriented to person, place, and time. He appears well-developed and well-nourished. No distress.  HENT:  Head: Normocephalic and atraumatic.  Mouth/Throat: Oropharynx is clear and moist.  Eyes: Pupils are equal, round, and reactive to light.  Neck: Normal range of motion. Neck supple.  Cardiovascular: Normal rate and regular rhythm.   No murmur heard. Pulmonary/Chest: Effort normal and breath sounds normal. No respiratory distress. He has no wheezes.  Abdominal: Soft. Bowel sounds are normal.  Musculoskeletal: Normal range of motion. He exhibits no edema.  The incision site on the right knee appears to be healing well. There is no redness or erythema.  He seems to have good range of motion without pain.   There is no calf tenderness bilaterally.  Homan sign is absent bilaterally.  Neurological: He is alert and oriented to person, place, and time.  Skin: Skin is warm and dry. He is not diaphoretic.    ED Course  Procedures (including critical care time)  Labs Reviewed  CBC WITH DIFFERENTIAL  COMPREHENSIVE METABOLIC PANEL  TROPONIN I  PROTIME-INR  URINALYSIS, ROUTINE W REFLEX MICROSCOPIC   No results found.   No diagnosis found.   Date: 11/16/2012  Rate: 73  Rhythm: normal sinus rhythm  QRS Axis: normal  Intervals: normal  ST/T Wave abnormalities: normal  Conduction Disutrbances:none  Narrative Interpretation:   Old EKG Reviewed: none available    MDM  The patient presents after an episode of  weakness, shortness of breath at home.  He recently underwent knee  replacement and just arrived home from rehab yesterday.  This episode occurred while attempting to walk around  the house.  I suspect this is more related to deconditioning as the labs and ekg are otherwise unremarkable.  He is somewhat anemic, however stools are heme negative.  I doubt any active process.          Geoffery Lyons, MD 11/16/12 1515

## 2012-11-18 DIAGNOSIS — M1710 Unilateral primary osteoarthritis, unspecified knee: Secondary | ICD-10-CM | POA: Insufficient documentation

## 2012-11-18 NOTE — Progress Notes (Signed)
Patient ID: Christian Erickson, male   DOB: Jun 25, 1931, 79 y.o.   MRN: [id number]        HISTORY & PHYSICAL  DATE: 11/02/2012   FACILITY: [facility]  LEVEL OF CARE: SNF (31)  ALLERGIES:  Allergies  Allergen Reactions  . Beef-Derived Products Swelling  . Pork-Derived Products Swelling  . Vasotec     Angioedema, see OV 09-05-11    CHIEF COMPLAINT:  Manage right knee osteoarthritis, hypertension, and diabetes mellitus.    HISTORY OF PRESENT ILLNESS:  The patient is an 79 year-old, African-American male.    KNEE OSTEOARTHRITIS: Patient had a history of pain and functional disability in the knee due to end-stage osteoarthritis and has failed nonsurgical conservative treatments. Patient had worsening of pain with activity and weight bearing, pain that interfered with activities of daily living & pain with passive range of motion. Therefore patient underwent total knee arthroplasty and tolerated the procedure well. Patient is admitted to this facility for sort short-term rehabilitation. Patient denies knee pain.   HTN: Pt 's HTN remains stable.  Denies CP, sob, DOE, pedal edema, headaches, dizziness or visual disturbances.  No complications from the medications currently being used.  Last BP : 163/90.  DM:pt's DM remains stable.  Pt denies polyuria, polydipsia, polyphagia, changes in vision or hypoglycemic episodes.  No complications noted from the medication presently being used.  Last hemoglobin A1c is: A recent  hemoglobin A1C is not available.   PAST MEDICAL HISTORY :  Past Medical History  Diagnosis Date  . BPH (benign prostatic hypertrophy)   . Diabetes mellitus   . Hypertension   . Hyperlipidemia   . Diverticulitis   . ED (erectile dysfunction)   . Glaucoma(365)     sees eye doctor routinely  . Allergic rhinitis   . Subclinical hypothyroidism   . Arthritis   . Blood dyscrasia     PAST SURGICAL HISTORY: Past Surgical History  Procedure Laterality Date  .  Inguinal hernia repair    . Colectomy      w/ reversal due to divertiuclitis per patient in 1990s aprox  . Sbo repair    . Total knee arthroplasty Right 10/27/2012    Dr Eulah Pont  . Total knee arthroplasty Right 10/27/2012    Procedure: TOTAL KNEE ARTHROPLASTY;  Surgeon: Loreta Ave, MD;  Location: [facility] OR;  Service: Orthopedics;  Laterality: Right;    SOCIAL HISTORY:  reports that he has quit smoking. His smoking use included Cigarettes. He smoked 0.00 packs per day. He has never used smokeless tobacco. He reports that he does not drink alcohol or use illicit drugs.  FAMILY HISTORY:  Family History  Problem Relation Age of Onset  . Lupus    . Stroke Brother 68    CVA, no FH premature CAD  . Alzheimer's disease    . Diabetes      granddaughter    CURRENT MEDICATIONS: Reviewed per [facility]  REVIEW OF SYSTEMS:  See HPI otherwise 14 point ROS is negative.  PHYSICAL EXAMINATION  VS:  T 98       P 88     RR 16     BP 163/90      POX 100% room air        WT (Lb)  GENERAL: no acute distress, normal body habitus SKIN: right knee incision clean and dry, staples in place, warm & dry, no suspicious lesions or rashes, no excessive dryness EYES: conjunctivae normal, sclerae normal, normal  eye lids MOUTH/THROAT: lips without lesions,no lesions in the mouth,tongue is without lesions,uvula elevates in midline NECK: supple, trachea midline, no neck masses, no thyroid tenderness, no thyromegaly LYMPHATICS: no LAN in the neck, no supraclavicular LAN RESPIRATORY: breathing is even & unlabored, BS CTAB CARDIAC: RRR, no murmur,no extra heart sounds EDEMA/VARICOSITIES:  +1 bilateral lower extremity edema  ARTERIAL:  pedal pulses +1 bilaterally GI:  ABDOMEN: abdomen soft, normal BS, no masses, no tenderness  LIVER/SPLEEN: no hepatomegaly, no splenomegaly MUSCULOSKELETAL: HEAD: normal to inspection & palpation BACK: no kyphosis, scoliosis or spinal processes tenderness EXTREMITIES: LEFT UPPER  EXTREMITY: full range of motion, normal strength & tone RIGHT UPPER EXTREMITY:  full range of motion, normal strength & tone LEFT LOWER EXTREMITY:  full range of motion, normal strength & tone RIGHT LOWER EXTREMITY:  full range of motion, normal strength & tone PSYCHIATRIC: the patient is alert & oriented to person, affect & behavior appropriate  LABS/RADIOLOGY: Hemoglobin 12.3, white count 8, platelets 115.    Glucose 133, otherwise BMP normal.    INR 1.13.    ASSESSMENT/PLAN:  Right knee osteoarthritis.  Status post right total knee replacement.  Continue rehabilitation.   Hypertension.  Blood pressure elevated.  We will monitor.   Diabetes mellitus.  Continue current medications.  Check  hemoglobin A1C.   Allergic rhinitis.  Continue nasal spray.   BPH.  Continue Flomax.   Check CBC and BMP.   I have reviewed patient's medical records received at admission/from hospitalization.  CPT CODE: 62130

## 2012-12-10 ENCOUNTER — Ambulatory Visit (INDEPENDENT_AMBULATORY_CARE_PROVIDER_SITE_OTHER): Payer: Medicare Other | Admitting: Internal Medicine

## 2012-12-10 VITALS — BP 126/72 | HR 73 | Temp 98.8°F | Wt 151.0 lb

## 2012-12-10 DIAGNOSIS — R7989 Other specified abnormal findings of blood chemistry: Secondary | ICD-10-CM

## 2012-12-10 DIAGNOSIS — R634 Abnormal weight loss: Secondary | ICD-10-CM

## 2012-12-10 DIAGNOSIS — R6889 Other general symptoms and signs: Secondary | ICD-10-CM

## 2012-12-10 DIAGNOSIS — D649 Anemia, unspecified: Secondary | ICD-10-CM

## 2012-12-10 DIAGNOSIS — E119 Type 2 diabetes mellitus without complications: Secondary | ICD-10-CM

## 2012-12-10 LAB — CBC WITH DIFFERENTIAL/PLATELET
Basophils Relative: 0.5 % (ref 0.0–3.0)
Eosinophils Relative: 2.6 % (ref 0.0–5.0)
HCT: 33.6 % — ABNORMAL LOW (ref 39.0–52.0)
Lymphs Abs: 1 10*3/uL (ref 0.7–4.0)
MCV: 87.2 fl (ref 78.0–100.0)
Monocytes Absolute: 0.3 10*3/uL (ref 0.1–1.0)
Platelets: 218 10*3/uL (ref 150.0–400.0)
RBC: 3.85 Mil/uL — ABNORMAL LOW (ref 4.22–5.81)
WBC: 5.2 10*3/uL (ref 4.5–10.5)

## 2012-12-10 MED ORDER — MEGESTROL ACETATE 625 MG/5ML PO SUSP: 625.0000 mg | Freq: Every day | ORAL | Status: DC

## 2012-12-10 NOTE — Patient Instructions (Addendum)
Take Megace once a day for 2 or 3 weeks. Please come back to the office in 4 weeks.

## 2012-12-10 NOTE — Progress Notes (Signed)
  Subjective:    Patient ID: Christian Erickson, male    DOB: 1930/10/26, 79 y.o.   MRN: [id number]  HPI Chief complaint today is weight loss. Had a total knee replacement 4-23- 2014, after that went to [facility] for 3 weeks, at that time he felt somehow depress and  didn't like the food there but now he is back home, eating a little better and does not feel depressed at all. Had the episode of weakness and shortness of breath, went to the ER 11/16/2012, chest x-ray was negative, BMP and LFTs normal, hemoglobin was decreased to 9.2, at that point he was 3 weeks post surgery.  Past Medical History  Diagnosis Date  . BPH (benign prostatic hypertrophy)   . Diabetes mellitus   . Hypertension   . Hyperlipidemia   . Diverticulitis   . ED (erectile dysfunction)   . Glaucoma     sees eye doctor routinely  . Allergic rhinitis   . Subclinical hypothyroidism   . Arthritis   . Blood dyscrasia    Past Surgical History  Procedure Laterality Date  . Inguinal hernia repair    . Colectomy      w/ reversal due to divertiuclitis per patient in 1990s aprox  . Sbo repair    . Total knee arthroplasty Right 10/27/2012    Dr Eulah Pont  . Total knee arthroplasty Right 10/27/2012    Procedure: TOTAL KNEE ARTHROPLASTY;  Surgeon: Loreta Ave, MD;  Location: [facility] OR;  Service: Orthopedics;  Laterality: Right;    Review of Systems Appetite still decreased from baseline Denies postprandial nausea, vomiting, diarrhea. No blood in the stools. CBGs are in the 120s, no really high or low CBGs. At this point has no anxiety or depression. Managing pain primarily with Tylenol, from time to time uses hydrocortisone. No dysuria, gross hematuria, difficulty urinating, fever or chills.     Objective:   Physical Exam BP 126/72  Pulse 73  Temp(Src) 98.8 F (37.1 C) (Oral)  Wt 151 lb (68.493 kg)  BMI 24.38 kg/m2  SpO2 98%  General -- alert, well-developed,NAD .   Neck --no thyromegaly , no LADs Lungs -- normal  respiratory effort, no intercostal retractions, no accessory muscle use, and normal breath sounds.   Heart-- normal rate, regular rhythm, no murmur, and no gallop.   Abdomen--soft, non-tender, no distention, no masses  Extremities-- no pretibial edema bilaterally  Neurologic-- alert & oriented X3 and strength normal in all extremities. Psych-- Cognition and judgment appear intact. Alert and cooperative with normal attention span and concentration.  Seems in good spirits       Assessment & Plan:

## 2012-12-11 ENCOUNTER — Encounter: Payer: Self-pay | Admitting: Internal Medicine

## 2012-12-11 DIAGNOSIS — R634 Abnormal weight loss: Secondary | ICD-10-CM | POA: Insufficient documentation

## 2012-12-11 NOTE — Assessment & Plan Note (Signed)
Weight loss, Documented weight loss in the last 6 weeks when down from 160s to 151. Review of systems does not point to any specific etiology. Diabetes doesn't seem to be out of control. Chest x-ray 11/16/2012 which was negative. May be a combination of staying at a facility after TKR were he didn't like the food and also mild depression at the time He also had postop anemia. Plan: Check CBC, A1c and TSH Megace Short term.  multivitamin daily Reassess in 4 weeks

## 2012-12-13 ENCOUNTER — Ambulatory Visit: Payer: Medicare Other | Attending: Orthopedic Surgery | Admitting: Physical Therapy

## 2012-12-13 DIAGNOSIS — Z96659 Presence of unspecified artificial knee joint: Secondary | ICD-10-CM | POA: Insufficient documentation

## 2012-12-13 DIAGNOSIS — IMO0001 Reserved for inherently not codable concepts without codable children: Secondary | ICD-10-CM | POA: Insufficient documentation

## 2012-12-13 DIAGNOSIS — R262 Difficulty in walking, not elsewhere classified: Secondary | ICD-10-CM | POA: Insufficient documentation

## 2012-12-13 DIAGNOSIS — M25669 Stiffness of unspecified knee, not elsewhere classified: Secondary | ICD-10-CM | POA: Insufficient documentation

## 2012-12-13 DIAGNOSIS — M25569 Pain in unspecified knee: Secondary | ICD-10-CM | POA: Insufficient documentation

## 2012-12-13 DIAGNOSIS — R609 Edema, unspecified: Secondary | ICD-10-CM | POA: Insufficient documentation

## 2012-12-14 ENCOUNTER — Encounter: Payer: Self-pay | Admitting: *Deleted

## 2012-12-15 ENCOUNTER — Ambulatory Visit: Payer: Medicare Other | Admitting: Physical Therapy

## 2012-12-17 ENCOUNTER — Telehealth: Payer: Self-pay | Admitting: Internal Medicine

## 2012-12-17 ENCOUNTER — Ambulatory Visit: Payer: Medicare Other | Admitting: Physical Therapy

## 2012-12-17 DIAGNOSIS — N400 Enlarged prostate without lower urinary tract symptoms: Secondary | ICD-10-CM

## 2012-12-17 NOTE — Telephone Encounter (Signed)
Patient is calling to see if he can receive a referral to someone who specializes in the prostate issues he is having. He says the medication is not working for him.

## 2012-12-17 NOTE — Telephone Encounter (Signed)
BPH symptoms despite taking Cardura and Flomax. Arrange a urology referral, DX BPH

## 2012-12-17 NOTE — Telephone Encounter (Signed)
Dr.Paz please advise

## 2012-12-17 NOTE — Telephone Encounter (Signed)
Referral entered  

## 2012-12-20 ENCOUNTER — Ambulatory Visit: Payer: Medicare Other | Admitting: Physical Therapy

## 2012-12-22 ENCOUNTER — Ambulatory Visit: Payer: Medicare Other | Admitting: Physical Therapy

## 2012-12-24 ENCOUNTER — Ambulatory Visit: Payer: Medicare Other | Admitting: Physical Therapy

## 2012-12-27 ENCOUNTER — Ambulatory Visit: Payer: Medicare Other | Admitting: Physical Therapy

## 2012-12-29 ENCOUNTER — Ambulatory Visit: Payer: Medicare Other | Admitting: Physical Therapy

## 2012-12-31 ENCOUNTER — Ambulatory Visit: Payer: Medicare Other | Admitting: Physical Therapy

## 2013-01-10 ENCOUNTER — Ambulatory Visit: Payer: Medicare Other | Attending: Orthopedic Surgery | Admitting: Physical Therapy

## 2013-01-10 DIAGNOSIS — R609 Edema, unspecified: Secondary | ICD-10-CM | POA: Insufficient documentation

## 2013-01-10 DIAGNOSIS — M25569 Pain in unspecified knee: Secondary | ICD-10-CM | POA: Insufficient documentation

## 2013-01-10 DIAGNOSIS — IMO0001 Reserved for inherently not codable concepts without codable children: Secondary | ICD-10-CM | POA: Insufficient documentation

## 2013-01-10 DIAGNOSIS — Z96659 Presence of unspecified artificial knee joint: Secondary | ICD-10-CM | POA: Insufficient documentation

## 2013-01-10 DIAGNOSIS — R262 Difficulty in walking, not elsewhere classified: Secondary | ICD-10-CM | POA: Insufficient documentation

## 2013-01-10 DIAGNOSIS — M25669 Stiffness of unspecified knee, not elsewhere classified: Secondary | ICD-10-CM | POA: Insufficient documentation

## 2013-01-12 ENCOUNTER — Ambulatory Visit: Payer: Medicare Other | Admitting: Internal Medicine

## 2013-01-12 ENCOUNTER — Ambulatory Visit: Payer: Medicare Other | Admitting: Physical Therapy

## 2013-01-17 ENCOUNTER — Ambulatory Visit: Payer: Medicare Other | Admitting: Physical Therapy

## 2013-01-19 ENCOUNTER — Ambulatory Visit: Payer: Medicare Other | Admitting: Physical Therapy

## 2013-01-21 ENCOUNTER — Encounter: Payer: Self-pay | Admitting: Internal Medicine

## 2013-01-21 ENCOUNTER — Ambulatory Visit (INDEPENDENT_AMBULATORY_CARE_PROVIDER_SITE_OTHER): Payer: Medicare Other | Admitting: Internal Medicine

## 2013-01-21 VITALS — BP 142/75 | HR 87 | Temp 97.9°F | Ht 63.5 in | Wt 157.4 lb

## 2013-01-21 DIAGNOSIS — I10 Essential (primary) hypertension: Secondary | ICD-10-CM

## 2013-01-21 DIAGNOSIS — N419 Inflammatory disease of prostate, unspecified: Secondary | ICD-10-CM

## 2013-01-21 DIAGNOSIS — E785 Hyperlipidemia, unspecified: Secondary | ICD-10-CM

## 2013-01-21 DIAGNOSIS — R7989 Other specified abnormal findings of blood chemistry: Secondary | ICD-10-CM

## 2013-01-21 DIAGNOSIS — Z0000 Encounter for general adult medical examination without abnormal findings: Secondary | ICD-10-CM

## 2013-01-21 DIAGNOSIS — E119 Type 2 diabetes mellitus without complications: Secondary | ICD-10-CM

## 2013-01-21 DIAGNOSIS — N400 Enlarged prostate without lower urinary tract symptoms: Secondary | ICD-10-CM

## 2013-01-21 DIAGNOSIS — R6889 Other general symptoms and signs: Secondary | ICD-10-CM

## 2013-01-21 LAB — URINALYSIS
Bilirubin Urine: NEGATIVE
Ketones, ur: NEGATIVE
Leukocytes, UA: NEGATIVE
Urobilinogen, UA: 0.2 (ref 0.0–1.0)

## 2013-01-21 LAB — LIPID PANEL
HDL: 49.3 mg/dL (ref >39.00)
LDL Cholesterol: 108 mg/dL — ABNORMAL HIGH (ref 0–99)
Total CHOL/HDL Ratio: 3
VLDL: 8.6 mg/dL (ref 0.0–40.0)

## 2013-01-21 LAB — PSA: PSA: 2.3 ng/mL (ref 0.10–4.00)

## 2013-01-21 MED ORDER — ONETOUCH LANCETS MISC: Status: DC

## 2013-01-21 MED ORDER — GLUCOSE BLOOD VI STRP: ORAL_STRIP | Status: DC

## 2013-01-21 NOTE — Assessment & Plan Note (Signed)
Check labs today.

## 2013-01-21 NOTE — Assessment & Plan Note (Addendum)
Td 2008 pneumonia shot 06-2009  shingles immunization--  09-2010  last colonoscopy 11-2008, next in 5 years  Discussed diet and exercise

## 2013-01-21 NOTE — Patient Instructions (Addendum)

## 2013-01-21 NOTE — Assessment & Plan Note (Signed)
Increase BPH symptoms. will check a UA and urine culture and treat if appropriate. Will check a PSA to rule out prostatitis. Doubt urinary frequency related to diabetes, last A1c very good. He has Already been referred to urology

## 2013-01-21 NOTE — Assessment & Plan Note (Addendum)
TSH last month normal

## 2013-01-21 NOTE — Assessment & Plan Note (Signed)
Well controlled per last A1C last month

## 2013-01-21 NOTE — Progress Notes (Signed)
Subjective:    Patient ID: Christian Erickson, male    DOB: Oct 16, 1930, 79 y.o.   MRN: [id number]  HPI Here for Medicare annual exam  1. Risk factors based on Past M, S, F history: reviewed  2. Physical Activities: yard work, does rehab at [facility]   3. Depression/mood: (-) screening  4. Hearing: no problems noted or reported  5. ADL's: independent  6. Fall Risk: no recent problems, prevention discussed  7. Home Safety: does feel safe at home  8. Height, weight, &visual acuity: see VS, vision is good, has glaucoma, sees eye doctor routinely  9. Counseling: yes  10. Labs ordered based on risk factors: yes  11. Referral Coordination, if needed  12. Care Plan, see a/p  13. Cognitive Assessment: motor skills appropriate for age (decreased compared to last year), cognition and memory appropriate for age    in addition, we discussed the following: DJD, recovering from knee replacement, still having some stiffness, still going to physical therapy, has hydrocodone, very rarely uses it. Diabetes, good medication compliance, not recent CBGs High cholesterol good medication compliance, Hypertension good medication compliance, BP today 142/75, slightly higher than his normal ambulatory BPs. BPH, on 2 medications Cardura and Flomax, still has urinary frequency, "sudden urge to urinate" on and off, denies any difficulty urinating or gross hematuria   Past Medical History  Diagnosis Date  . BPH (benign prostatic hypertrophy)   . Diabetes mellitus   . Hypertension   . Hyperlipidemia   . Diverticulitis   . ED (erectile dysfunction)   . Glaucoma     sees eye doctor routinely  . Allergic rhinitis   . Subclinical hypothyroidism   . Arthritis   . Blood dyscrasia    Past Surgical History  Procedure Laterality Date  . Inguinal hernia repair    . Colectomy      w/ reversal due to divertiuclitis per patient in 1990s aprox  . Sbo repair    . Total knee arthroplasty Right 10/27/2012    Procedure: TOTAL  KNEE ARTHROPLASTY;  Surgeon: Loreta Ave, MD;  Location: [facility] OR;  Service: Orthopedics;  Laterality: Right;   History   Social History  . Marital Status: Married    Spouse Name: N/A    Number of Children: 2  . Years of Education: N/A   Occupational History  . Retired-- Hotel manager, [employer]    Social History Main Topics  . Smoking status: Former Smoker    Types: Cigarettes  . Smokeless tobacco: Never Used     Comment: used to smoke rarely  . Alcohol Use: Yes     Comment: rarely  . Drug Use: No  . Sexually Active: Not on file   Other Topics Concern  . Not on file   Social History Narrative   Wife had CABG 2011   Married x > 44 years, children x 2 , 2 G daughters         Family History  Problem Relation Age of Onset  . Lupus Sister   . Stroke Brother 64    CVA, no FH premature CAD  . Diabetes Other     granddaughter  . Colon cancer Neg Hx   . Prostate cancer Other     cousin, age 67  . CAD Mother     "enlarged heart"    Review of Systems Diet is regular, appetite increase, was losing weight, see last office visit, now gaining weight. No fever or chills No  nausea, vomiting  or blood in the stools. Occasional diarrhea few weeks ago, symptoms resolved. No chest pain, shortness of breath, edema @ baseline    Objective:   Physical Exam  BP 142/75  Pulse 87  Temp(Src) 97.9 F (36.6 C) (Oral)  Ht 5' 3.5" (1.613 m)  Wt 157 lb 6.4 oz (71.396 kg)  BMI 27.44 kg/m2  SpO2 98% General -- alert, well-developed, NAD.   Neck --no thyromegaly , normal carotid pulse, no bruit Lungs -- normal respiratory effort, no intercostal retractions, no accessory muscle use, and normal breath sounds.   Heart-- normal rate, regular rhythm, no murmur, and no gallop.   Abdomen--soft, non-tender, no distention, no masses, no bruit Extremities-- trace pretibial edema bilaterally Rectal-- + external hemorrhoid  noted. Normal sphincter tone. No rectal masses or tenderness. Brown  stool  Prostate:  Prostate gland firm and smooth, no enlargement, nodularity, tenderness, mass, asymmetry or induration. Neurologic-- alert & oriented X3 and strength symmetric  . Psych-- Cognition and judgment appear intact. Alert and cooperative with normal attention span and concentration.  not anxious appearing and not depressed appearing.      Assessment & Plan:

## 2013-01-21 NOTE — Assessment & Plan Note (Signed)
amb BPs wnl, no change

## 2013-01-26 ENCOUNTER — Telehealth: Payer: Self-pay | Admitting: *Deleted

## 2013-01-26 NOTE — Telephone Encounter (Signed)
Patient returned an office call about his labs.  I explained all results to patient and relayed all Dr. Leta Jungling instructions.

## 2013-03-05 ENCOUNTER — Other Ambulatory Visit: Payer: Self-pay | Admitting: Internal Medicine

## 2013-03-08 ENCOUNTER — Other Ambulatory Visit: Payer: Self-pay | Admitting: *Deleted

## 2013-03-08 DIAGNOSIS — I10 Essential (primary) hypertension: Secondary | ICD-10-CM

## 2013-03-08 MED ORDER — DOXAZOSIN MESYLATE 2 MG PO TABS: 2.0000 mg | ORAL_TABLET | Freq: Every day | ORAL | Status: DC

## 2013-03-08 NOTE — Telephone Encounter (Signed)
RF for cardura sent to [facility] on [address]

## 2013-04-19 ENCOUNTER — Other Ambulatory Visit: Payer: Self-pay | Admitting: Internal Medicine

## 2013-04-19 NOTE — Telephone Encounter (Signed)
rx refilled per protocol. DJR  

## 2013-05-10 ENCOUNTER — Telehealth: Payer: Self-pay | Admitting: Internal Medicine

## 2013-05-10 ENCOUNTER — Other Ambulatory Visit: Payer: Self-pay | Admitting: Internal Medicine

## 2013-05-10 ENCOUNTER — Telehealth: Payer: Self-pay | Admitting: *Deleted

## 2013-05-10 ENCOUNTER — Ambulatory Visit (INDEPENDENT_AMBULATORY_CARE_PROVIDER_SITE_OTHER): Payer: Medicare Other

## 2013-05-10 DIAGNOSIS — E119 Type 2 diabetes mellitus without complications: Secondary | ICD-10-CM

## 2013-05-10 DIAGNOSIS — Z23 Encounter for immunization: Secondary | ICD-10-CM

## 2013-05-10 MED ORDER — SAXAGLIPTIN HCL 5 MG PO TABS: ORAL_TABLET | ORAL | Status: DC

## 2013-05-10 MED ORDER — SAXAGLIPTIN HCL 2.5 MG PO TABS: 5.0000 mg | ORAL_TABLET | Freq: Every day | ORAL | Status: DC

## 2013-05-10 NOTE — Telephone Encounter (Signed)
Pt requesting to have samples for onglza 5mg  until he receives his mail order of rx.  Spoke to Dr. Drue Novel states it was ok to give samples. Pt received 2wks worth of samples to have until mail order is received . DJR

## 2013-05-10 NOTE — Telephone Encounter (Signed)
Done. DJR  

## 2013-05-10 NOTE — Telephone Encounter (Signed)
Atorvastatin refill sent to pharamcy

## 2013-05-10 NOTE — Telephone Encounter (Signed)
Patient called and requested a refill for ONGLYZA 5 MG TABS tablet    Pharmacy [facility] home delivery

## 2013-05-24 ENCOUNTER — Ambulatory Visit: Payer: Medicare Other | Admitting: Internal Medicine

## 2013-06-03 ENCOUNTER — Other Ambulatory Visit: Payer: Self-pay | Admitting: Internal Medicine

## 2013-06-05 ENCOUNTER — Other Ambulatory Visit: Payer: Self-pay | Admitting: Internal Medicine

## 2013-06-06 ENCOUNTER — Ambulatory Visit (INDEPENDENT_AMBULATORY_CARE_PROVIDER_SITE_OTHER): Payer: Medicare Other | Admitting: Internal Medicine

## 2013-06-06 ENCOUNTER — Encounter: Payer: Self-pay | Admitting: Internal Medicine

## 2013-06-06 VITALS — BP 165/70 | HR 69 | Temp 97.9°F | Wt 163.0 lb

## 2013-06-06 DIAGNOSIS — N400 Enlarged prostate without lower urinary tract symptoms: Secondary | ICD-10-CM

## 2013-06-06 DIAGNOSIS — I10 Essential (primary) hypertension: Secondary | ICD-10-CM

## 2013-06-06 DIAGNOSIS — E119 Type 2 diabetes mellitus without complications: Secondary | ICD-10-CM

## 2013-06-06 MED ORDER — TAMSULOSIN HCL 0.4 MG PO CAPS: 0.4000 mg | ORAL_CAPSULE | Freq: Every day | ORAL | Status: DC

## 2013-06-06 MED ORDER — SAXAGLIPTIN HCL 5 MG PO TABS: ORAL_TABLET | ORAL | Status: DC

## 2013-06-06 NOTE — Telephone Encounter (Signed)
Amlodipine refilled per protocol 

## 2013-06-06 NOTE — Progress Notes (Signed)
   Subjective:    Patient ID: Christian Erickson, male    DOB: [birth year], 79 y.o.   MRN: [id number]  HPI Routine office visit BPH--saw Dr. Brunilda Payor., felt to be stable and no further workup was recommended, symptoms are currently stable. Diabetes--run ot of onglyza few days ago. Also he is concerned about side effects from medications: reports occasional Episodes of uncontrolled diarrhea, loose stools but no blood. In between episodes stools are normal. Hypertension ----BP noted to be elevated today, he ran out of amlodipine several days ago.   Past Medical History  Diagnosis Date  . BPH (benign prostatic hypertrophy)   . Diabetes mellitus   . Hypertension   . Hyperlipidemia   . Diverticulitis   . ED (erectile dysfunction)   . Glaucoma     sees eye doctor routinely  . Allergic rhinitis   . Subclinical hypothyroidism   . Arthritis   . Blood dyscrasia    Past Surgical History  Procedure Laterality Date  . Inguinal hernia repair    . Colectomy      w/ reversal due to divertiuclitis per patient in 1990s aprox  . Sbo repair    . Total knee arthroplasty Right 10/27/2012    Procedure: TOTAL KNEE ARTHROPLASTY;  Surgeon: Loreta Ave, MD;  Location: [facility] OR;  Service: Orthopedics;  Laterality: Right;     Review of Systems No CP-SOB Denies nausea, vomiting, abdominal pain    Objective:   Physical Exam BP 165/70  Pulse 69  Temp(Src) 97.9 F (36.6 C)  Wt 163 lb (73.936 kg)  SpO2 98%  General -- alert, well-developed, NAD.  Lungs -- normal respiratory effort, no intercostal retractions, no accessory muscle use, and normal breath sounds.  Heart-- normal rate, regular rhythm, no murmur.  Abdomen-- Not distended, good bowel sounds,soft, non-tender. Extremities-- trace pretibial edema bilaterally  Neurologic--  alert & oriented X3. Speech normal, gait slow, slt unsteady Psych-- Cognition and judgment appear intact. Cooperative with normal attention span and concentration. No anxious  appearing , no depressed appearing.      Assessment & Plan:

## 2013-06-06 NOTE — Telephone Encounter (Signed)
onglyza refilled per protocol

## 2013-06-06 NOTE — Assessment & Plan Note (Signed)
Saw urology a few months ago, no further eval was deemed necessary

## 2013-06-06 NOTE — Progress Notes (Signed)
Pre visit review using our clinic review tool, if applicable. No additional management support is needed unless otherwise documented below in the visit note. 

## 2013-06-06 NOTE — Assessment & Plan Note (Addendum)
BP slightly elevated today, ran out of amlodipine few days ago, he depends on [facility]  for his medications

## 2013-06-06 NOTE — Patient Instructions (Signed)
Get your blood work before you leave  Next visit for a follow up (30 minutes)  regards diabetes hypertension , no fasting, in 4 months  Please make an appointment

## 2013-06-06 NOTE — Assessment & Plan Note (Addendum)
Last A1c was very good. Today he reports diarrhea x ~ 6 months, on and off, he is up-to-date on colonoscopies. Pt wonders if sx related to diabetes medications? has been on metformin for more than 2 years, has been onglyza for ~11 months . He states that symptoms are not bothersome. Also having some problems get onglyza from [facility], samples provided Plan: Continue present care, if diarrhea increased he will let me know.

## 2013-06-07 LAB — BASIC METABOLIC PANEL
CO2: 28 mEq/L (ref 19–32)
Calcium: 9.2 mg/dL (ref 8.4–10.5)
Creatinine, Ser: 1 mg/dL (ref 0.4–1.5)
Glucose, Bld: 160 mg/dL — ABNORMAL HIGH (ref 70–99)

## 2013-06-09 ENCOUNTER — Encounter: Payer: Self-pay | Admitting: *Deleted

## 2013-07-23 ENCOUNTER — Emergency Department (HOSPITAL_COMMUNITY): Payer: Medicare Other

## 2013-07-23 ENCOUNTER — Encounter (HOSPITAL_COMMUNITY): Payer: Self-pay | Admitting: Emergency Medicine

## 2013-07-23 ENCOUNTER — Inpatient Hospital Stay (HOSPITAL_COMMUNITY): Payer: Medicare Other

## 2013-07-23 ENCOUNTER — Inpatient Hospital Stay (HOSPITAL_COMMUNITY)
Admission: EM | Admit: 2013-07-23 | Discharge: 2013-07-27 | DRG: 064 | Disposition: A | Discharge status: Skilled Nursing Facility | Payer: Medicare Other | Attending: Internal Medicine | Admitting: Internal Medicine

## 2013-07-23 DIAGNOSIS — J309 Allergic rhinitis, unspecified: Secondary | ICD-10-CM

## 2013-07-23 DIAGNOSIS — M129 Arthropathy, unspecified: Secondary | ICD-10-CM | POA: Diagnosis present

## 2013-07-23 DIAGNOSIS — Z833 Family history of diabetes mellitus: Secondary | ICD-10-CM

## 2013-07-23 DIAGNOSIS — Z888 Allergy status to other drugs, medicaments and biological substances status: Secondary | ICD-10-CM

## 2013-07-23 DIAGNOSIS — Z87891 Personal history of nicotine dependence: Secondary | ICD-10-CM

## 2013-07-23 DIAGNOSIS — Z79899 Other long term (current) drug therapy: Secondary | ICD-10-CM

## 2013-07-23 DIAGNOSIS — Z91199 Patient's noncompliance with other medical treatment and regimen due to unspecified reason: Secondary | ICD-10-CM

## 2013-07-23 DIAGNOSIS — I498 Other specified cardiac arrhythmias: Secondary | ICD-10-CM | POA: Diagnosis present

## 2013-07-23 DIAGNOSIS — I10 Essential (primary) hypertension: Secondary | ICD-10-CM

## 2013-07-23 DIAGNOSIS — F528 Other sexual dysfunction not due to a substance or known physiological condition: Secondary | ICD-10-CM

## 2013-07-23 DIAGNOSIS — M549 Dorsalgia, unspecified: Secondary | ICD-10-CM

## 2013-07-23 DIAGNOSIS — Z7982 Long term (current) use of aspirin: Secondary | ICD-10-CM

## 2013-07-23 DIAGNOSIS — Z23 Encounter for immunization: Secondary | ICD-10-CM

## 2013-07-23 DIAGNOSIS — W19XXXA Unspecified fall, initial encounter: Secondary | ICD-10-CM | POA: Diagnosis present

## 2013-07-23 DIAGNOSIS — R413 Other amnesia: Secondary | ICD-10-CM

## 2013-07-23 DIAGNOSIS — Z8719 Personal history of other diseases of the digestive system: Secondary | ICD-10-CM

## 2013-07-23 DIAGNOSIS — E119 Type 2 diabetes mellitus without complications: Secondary | ICD-10-CM

## 2013-07-23 DIAGNOSIS — Z96659 Presence of unspecified artificial knee joint: Secondary | ICD-10-CM

## 2013-07-23 DIAGNOSIS — Z0000 Encounter for general adult medical examination without abnormal findings: Secondary | ICD-10-CM

## 2013-07-23 DIAGNOSIS — D759 Disease of blood and blood-forming organs, unspecified: Secondary | ICD-10-CM | POA: Diagnosis present

## 2013-07-23 DIAGNOSIS — R634 Abnormal weight loss: Secondary | ICD-10-CM

## 2013-07-23 DIAGNOSIS — G9340 Encephalopathy, unspecified: Secondary | ICD-10-CM | POA: Diagnosis present

## 2013-07-23 DIAGNOSIS — Z8042 Family history of malignant neoplasm of prostate: Secondary | ICD-10-CM

## 2013-07-23 DIAGNOSIS — F0280 Dementia in other diseases classified elsewhere without behavioral disturbance: Secondary | ICD-10-CM

## 2013-07-23 DIAGNOSIS — Z8249 Family history of ischemic heart disease and other diseases of the circulatory system: Secondary | ICD-10-CM

## 2013-07-23 DIAGNOSIS — R7989 Other specified abnormal findings of blood chemistry: Secondary | ICD-10-CM

## 2013-07-23 DIAGNOSIS — H409 Unspecified glaucoma: Secondary | ICD-10-CM

## 2013-07-23 DIAGNOSIS — D696 Thrombocytopenia, unspecified: Secondary | ICD-10-CM

## 2013-07-23 DIAGNOSIS — M1990 Unspecified osteoarthritis, unspecified site: Secondary | ICD-10-CM

## 2013-07-23 DIAGNOSIS — Z91018 Allergy to other foods: Secondary | ICD-10-CM

## 2013-07-23 DIAGNOSIS — F0390 Unspecified dementia without behavioral disturbance: Secondary | ICD-10-CM | POA: Diagnosis present

## 2013-07-23 DIAGNOSIS — Z823 Family history of stroke: Secondary | ICD-10-CM

## 2013-07-23 DIAGNOSIS — E785 Hyperlipidemia, unspecified: Secondary | ICD-10-CM

## 2013-07-23 DIAGNOSIS — G309 Alzheimer's disease, unspecified: Secondary | ICD-10-CM

## 2013-07-23 DIAGNOSIS — F1010 Alcohol abuse, uncomplicated: Secondary | ICD-10-CM | POA: Diagnosis present

## 2013-07-23 DIAGNOSIS — G8190 Hemiplegia, unspecified affecting unspecified side: Secondary | ICD-10-CM | POA: Diagnosis present

## 2013-07-23 DIAGNOSIS — I639 Cerebral infarction, unspecified: Secondary | ICD-10-CM

## 2013-07-23 DIAGNOSIS — R609 Edema, unspecified: Secondary | ICD-10-CM

## 2013-07-23 DIAGNOSIS — Z9119 Patient's noncompliance with other medical treatment and regimen: Secondary | ICD-10-CM

## 2013-07-23 DIAGNOSIS — N400 Enlarged prostate without lower urinary tract symptoms: Secondary | ICD-10-CM

## 2013-07-23 DIAGNOSIS — I6350 Cerebral infarction due to unspecified occlusion or stenosis of unspecified cerebral artery: Principal | ICD-10-CM | POA: Diagnosis present

## 2013-07-23 LAB — COMPREHENSIVE METABOLIC PANEL
ALBUMIN: 3.8 g/dL (ref 3.5–5.2)
ALK PHOS: 85 U/L (ref 39–117)
ALT: 14 U/L (ref 0–53)
AST: 20 U/L (ref 0–37)
BILIRUBIN TOTAL: 0.6 mg/dL (ref 0.3–1.2)
BUN: 18 mg/dL (ref 6–23)
CHLORIDE: 101 meq/L (ref 96–112)
CO2: 26 mEq/L (ref 19–32)
Calcium: 9.4 mg/dL (ref 8.4–10.5)
Creatinine, Ser: 1.12 mg/dL (ref 0.50–1.35)
GFR calc Af Amer: 70 mL/min — ABNORMAL LOW (ref >90)
GFR calc non Af Amer: 60 mL/min — ABNORMAL LOW (ref >90)
GLUCOSE: 102 mg/dL — AB (ref 70–99)
POTASSIUM: 4.7 meq/L (ref 3.7–5.3)
SODIUM: 140 meq/L (ref 137–147)
TOTAL PROTEIN: 7.8 g/dL (ref 6.0–8.3)

## 2013-07-23 LAB — URINALYSIS, ROUTINE W REFLEX MICROSCOPIC
BILIRUBIN URINE: NEGATIVE
GLUCOSE, UA: NEGATIVE mg/dL
Hgb urine dipstick: NEGATIVE
KETONES UR: NEGATIVE mg/dL
Leukocytes, UA: NEGATIVE
Nitrite: NEGATIVE
PH: 8 (ref 5.0–8.0)
PROTEIN: NEGATIVE mg/dL
Specific Gravity, Urine: 1.013 (ref 1.005–1.030)
Urobilinogen, UA: 0.2 mg/dL (ref 0.0–1.0)

## 2013-07-23 LAB — CBC
HCT: 43.5 % (ref 39.0–52.0)
HCT: 43.1 % (ref 39.0–52.0)
HEMOGLOBIN: 15 g/dL (ref 13.0–17.0)
Hemoglobin: 15.1 g/dL (ref 13.0–17.0)
MCH: 30.5 pg (ref 26.0–34.0)
MCH: 30.7 pg (ref 26.0–34.0)
MCHC: 34.5 g/dL (ref 30.0–36.0)
MCHC: 35 g/dL (ref 30.0–36.0)
MCV: 88.4 fL (ref 78.0–100.0)
MCV: 87.6 fL (ref 78.0–100.0)
PLATELETS: 120 10*3/uL — AB (ref 150–400)
Platelets: 134 10*3/uL — ABNORMAL LOW (ref 150–400)
RBC: 4.92 MIL/uL (ref 4.22–5.81)
RBC: 4.92 MIL/uL (ref 4.22–5.81)
RDW: 14.9 % (ref 11.5–15.5)
RDW: 14.8 % (ref 11.5–15.5)
WBC: 5.4 10*3/uL (ref 4.0–10.5)
WBC: 4.8 10*3/uL (ref 4.0–10.5)

## 2013-07-23 LAB — TROPONIN I: Troponin I: <0.3 ng/mL (ref <0.30)

## 2013-07-23 LAB — APTT: aPTT: 30 seconds (ref 24–37)

## 2013-07-23 LAB — PROTIME-INR
INR: 0.98 (ref 0.00–1.49)
Prothrombin Time: 12.8 seconds (ref 11.6–15.2)

## 2013-07-23 MED ORDER — LINAGLIPTIN 5 MG PO TABS
5.0000 mg | ORAL_TABLET | Freq: Every day | ORAL | Status: DC
  Administered 2013-07-23 – 2013-07-27 (×5): 5 mg via ORAL
  Filled 2013-07-23 (×5): qty 1

## 2013-07-23 MED ORDER — FLUTICASONE PROPIONATE 50 MCG/ACT NA SUSP: 2.0000 | NASAL | Status: DC | PRN

## 2013-07-23 MED ORDER — PNEUMOCOCCAL VAC POLYVALENT 25 MCG/0.5ML IJ INJ
0.5000 mL | INJECTION | INTRAMUSCULAR | Status: AC
  Administered 2013-07-24: 0.5 mL via INTRAMUSCULAR
  Filled 2013-07-23: qty 0.5

## 2013-07-23 MED ORDER — LABETALOL HCL 5 MG/ML IV SOLN
10.0000 mg | Freq: Four times a day (QID) | INTRAVENOUS | Status: DC | PRN
Start: 2013-07-23 — End: 2013-07-25

## 2013-07-23 MED ORDER — SENNOSIDES-DOCUSATE SODIUM 8.6-50 MG PO TABS
1.0000 | ORAL_TABLET | Freq: Every evening | ORAL | Status: DC | PRN
  Filled 2013-07-23: qty 1

## 2013-07-23 MED ORDER — INSULIN ASPART 100 UNIT/ML ~~LOC~~ SOLN
0.0000 [IU] | Freq: Three times a day (TID) | SUBCUTANEOUS | Status: DC
  Administered 2013-07-24 (×2): 3 [IU] via SUBCUTANEOUS
  Administered 2013-07-25: 1 [IU] via SUBCUTANEOUS
  Administered 2013-07-25: 2 [IU] via SUBCUTANEOUS
  Administered 2013-07-25: 3 [IU] via SUBCUTANEOUS
  Administered 2013-07-26 – 2013-07-27 (×2): 2 [IU] via SUBCUTANEOUS
  Administered 2013-07-27: 5 [IU] via SUBCUTANEOUS

## 2013-07-23 MED ORDER — ENOXAPARIN SODIUM 40 MG/0.4ML ~~LOC~~ SOLN
40.0000 mg | SUBCUTANEOUS | Status: DC
  Administered 2013-07-23 – 2013-07-26 (×4): 40 mg via SUBCUTANEOUS
  Filled 2013-07-23 (×5): qty 0.4

## 2013-07-23 MED ORDER — ATORVASTATIN CALCIUM 20 MG PO TABS
20.0000 mg | ORAL_TABLET | Freq: Every day | ORAL | Status: DC
  Administered 2013-07-23: 20 mg via ORAL
  Filled 2013-07-23 (×2): qty 1

## 2013-07-23 MED ORDER — AMLODIPINE BESYLATE 10 MG PO TABS
10.0000 mg | ORAL_TABLET | Freq: Every day | ORAL | Status: DC
  Administered 2013-07-23 – 2013-07-27 (×5): 10 mg via ORAL
  Filled 2013-07-23 (×5): qty 1

## 2013-07-23 MED ORDER — TAMSULOSIN HCL 0.4 MG PO CAPS
0.4000 mg | ORAL_CAPSULE | Freq: Every day | ORAL | Status: DC
  Administered 2013-07-24 – 2013-07-27 (×4): 0.4 mg via ORAL
  Filled 2013-07-23 (×6): qty 1

## 2013-07-23 MED ORDER — ASPIRIN 325 MG PO TABS
325.0000 mg | ORAL_TABLET | Freq: Every day | ORAL | Status: DC
  Administered 2013-07-23 – 2013-07-25 (×3): 325 mg via ORAL
  Filled 2013-07-23 (×3): qty 1

## 2013-07-23 MED ORDER — FLUTICASONE PROPIONATE 50 MCG/ACT NA SUSP: 2.0000 | Freq: Every day | NASAL | Status: DC | PRN

## 2013-07-23 MED ORDER — ASPIRIN 300 MG RE SUPP
300.0000 mg | Freq: Every day | RECTAL | Status: DC
  Filled 2013-07-23 (×2): qty 1

## 2013-07-23 NOTE — ED Notes (Signed)
Phlebotomy at the bedside  

## 2013-07-23 NOTE — ED Notes (Signed)
Attempted to call report to 4N

## 2013-07-23 NOTE — ED Notes (Signed)
Attempted to call report again. Nurse stated to call back and ask for charge.

## 2013-07-23 NOTE — ED Notes (Signed)
Patient transported to MRI 

## 2013-07-23 NOTE — H&P (Signed)
[facility] History and Physical  Christian Erickson ZOX:[id number] DOB: 1931-01-11 DOA: 07/23/2013  Referring physician:   PCP: Willow Ora, MD   Chief Complaint: CVA   HPI:  80 year old male with a history of diabetes, hypertension, dyslipidemia, presents to the ED after a fall. The patient was trying to go to bed and 5 AM this morning when his wife heard a thud, , she ran outside and found her husband on the floor, however he was sitting up and wide awake. He was unable to follow commands or any of her instructions to try to get him to stand up. She called EMS. He was found to be hypertensive with systolic blood pressure in the 190s, found to have a left-sided gaze, slurred speech, right-sided weakness. Most of the history is obtained from the daughter and the wife are currently present by the bedside. CBG was 131 upon arrival he was found to be in sinus rhythm/sinus bradycardia. He was found to have improving grip strength, in his right hand, but still a significant amount of motor weakness on the right side.  At baseline the patient's functional status is deteriorating slowly. Daughter states that he's had some short-term memory loss. He can go about his activities of daily living, he still drives, walks independently. However he sometimes forgets to take his medications. His sleep pattern has been problematic and he sleeps from 5 AM to 4 PM.  Was told by his PCP that he and signs of early dementia.    Review of Systems: negative for the following  Constitutional: As in history of present illness HEENT: As in history of present illness  Respiratory: Denies SOB, DOE, cough, chest tightness, and wheezing.  Cardiovascular: Denies chest pain, palpitations and leg swelling.  Gastrointestinal: Denies nausea, vomiting, abdominal pain, diarrhea, constipation, blood in stool and abdominal distention.  Genitourinary: Denies dysuria, urgency, frequency, hematuria, flank pain and difficulty  urinating.  Musculoskeletal: Denies myalgias, back pain, joint swelling, arthralgias and gait problem.  Skin: Denies pallor, rash and wound.  Neurological: As in history of present illness Hematological: Denies adenopathy. Easy bruising, personal or family bleeding history  Psychiatric/Behavioral: Denies suicidal ideation, mood changes, confusion, nervousness, sleep disturbance and agitation       Past Medical History  Diagnosis Date  . BPH (benign prostatic hypertrophy)   . Diabetes mellitus   . Hypertension   . Hyperlipidemia   . Diverticulitis   . ED (erectile dysfunction)   . Glaucoma     sees eye doctor routinely  . Allergic rhinitis   . Subclinical hypothyroidism   . Arthritis   . Blood dyscrasia      Past Surgical History  Procedure Laterality Date  . Inguinal hernia repair    . Colectomy      w/ reversal due to divertiuclitis per patient in 1990s aprox  . Sbo repair    . Total knee arthroplasty Right 10/27/2012    Procedure: TOTAL KNEE ARTHROPLASTY;  Surgeon: Loreta Ave, MD;  Location: [facility] OR;  Service: Orthopedics;  Laterality: Right;      Social History:  reports that he has quit smoking. His smoking use included Cigarettes. He smoked 0.00 packs per day. He has never used smokeless tobacco. He reports that he drinks alcohol. He reports that he does not use illicit drugs.   Allergies  Allergen Reactions  . Beef-Derived Products Swelling  . Pork-Derived Products Swelling  . Vasotec     Angioedema, see OV 09-05-11    Family History  Problem Relation Age of Onset  . Lupus Sister   . Stroke Brother 43    CVA, no FH premature CAD  . Diabetes Other     granddaughter  . Colon cancer Neg Hx   . Prostate cancer Other     cousin, age 42  . CAD Mother     "enlarged heart"     Prior to Admission medications   Medication Sig Start Date End Date Taking? Authorizing Provider  amLODipine (NORVASC) 5 MG tablet take 1 tablet by mouth once daily 06/03/13    Wanda Plump, MD  atorvastatin (LIPITOR) 20 MG tablet TAKE 1 TABLET DAILY 05/10/13   Wanda Plump, MD  bimatoprost (LUMIGAN) 0.03 % ophthalmic solution 1 drop at bedtime.      Historical Provider, MD  doxazosin (CARDURA) 2 MG tablet take 1 tablet by mouth at bedtime 03/05/13   Wanda Plump, MD  fluticasone [facility]) 50 MCG/ACT nasal spray Place 2 sprays into the nose as needed for allergies.     Historical Provider, MD  glucose blood test strip Use as instructed 01/21/13   Wanda Plump, MD  HYDROcodone-acetaminophen [facility]) 10-325 MG per tablet Take 1-2 tablets by mouth every 4 (four) hours as needed (breakthrough pain). 10/29/12   Zonia Kief, PA-C  metFORMIN (GLUCOPHAGE) 1000 MG tablet TAKE 1 TABLET TWICE A DAY WITH MEALS 10/28/12   Wanda Plump, MD  methocarbamol (ROBAXIN) 500 MG tablet Take 1 tablet (500 mg total) by mouth every 6 (six) hours as needed (spasms). 10/29/12   Zonia Kief, PA-C  ONE TOUCH LANCETS MISC Check once daily. Dx:250.00  One touch ultra 01/21/13   Wanda Plump, MD  saxagliptin HCl (ONGLYZA) 5 MG TABS tablet take 1 tablet by mouth once daily 06/06/13   Wanda Plump, MD  tamsulosin (FLOMAX) 0.4 MG CAPS capsule Take 1 capsule (0.4 mg total) by mouth daily. 06/06/13   Wanda Plump, MD     Physical Exam: Filed Vitals:   07/23/13 1358 07/23/13 1430 07/23/13 1445 07/23/13 1500  BP: 151/76 172/63 167/55 149/71  Pulse: 58 55 51 55  Temp:      Resp: 16 12 11 14   Height:      Weight:      SpO2: 99% 98% 99% 97%     Constitutional: Vital signs reviewed. Patient is a well-developed and well-nourished in no acute distress and cooperative with exam. Somnolence but arousable Head: Normocephalic and atraumatic  Ear: TM normal bilaterally  Mouth: no erythema or exudates, MMM  Eyes: PERRL, EOMI, conjunctivae normal, No scleral icterus.  Neck: Supple, Trachea midline normal ROM, No JVD, mass, thyromegaly, or carotid bruit present.  Cardiovascular: RRR, S1 normal, S2 normal, no MRG, pulses symmetric  and intact bilaterally  Pulmonary/Chest: CTAB, no wheezes, rales, or rhonchi  Abdominal: Soft. Non-tender, non-distended, bowel sounds are normal, no masses, organomegaly, or guarding present.  GU: no CVA tenderness Musculoskeletal: No joint deformities, erythema, or stiffness, ROM full and no nontender Ext: no edema and no cyanosis, pulses palpable bilaterally (DP and PT)  Hematology: no cervical, inginal, or axillary adenopathy.  Neurological: Patient has mild weakness of his right grip compared to left and he states he is right-handed. His face is symmetrical. There is no other focal weakness seen.  Skin: Warm, dry and intact. No rash, cyanosis, or clubbing.  Psychiatric: Normal mood and affect. speech and behavior is normal. Judgment and thought content normal. Cognition and memory are normal.  Labs on Admission:    Basic Metabolic Panel:  Recent Labs Lab 07/23/13 0836  NA 140  K 4.7  CL 101  CO2 26  GLUCOSE 102*  BUN 18  CREATININE 1.12  CALCIUM 9.4   Liver Function Tests:  Recent Labs Lab 07/23/13 0836  AST 20  ALT 14  ALKPHOS 85  BILITOT 0.6  PROT 7.8  ALBUMIN 3.8   No results found for this basename: LIPASE, AMYLASE,  in the last 168 hours No results found for this basename: AMMONIA,  in the last 168 hours CBC:  Recent Labs Lab 07/23/13 0836  WBC 5.4  HGB 15.0  HCT 43.5  MCV 88.4  PLT 134*   Cardiac Enzymes:  Recent Labs Lab 07/23/13 0836  TROPONINI <0.30    BNP (last 3 results) No results found for this basename: PROBNP,  in the last 8760 hours    CBG: No results found for this basename: GLUCAP,  in the last 168 hours  Radiological Exams on Admission: Ct Head Wo Contrast (only If Suspected Head Trauma And/or Pt Is On Anticoagulant)  07/23/2013   CLINICAL DATA:  Mental status change with slurred speech and gait ease disturbance. , history of dementia  EXAM: CT HEAD WITHOUT CONTRAST  TECHNIQUE: Contiguous axial images were  obtained from the base of the skull through the vertex without intravenous contrast.  COMPARISON:  None.  FINDINGS: There is mild diffuse cerebral and cerebellar atrophy with compensatory ventriculomegaly. There is no shift of the midline. There is no evidence of an acute intracranial hemorrhage nor of an evolving ischemic infarction. There are no abnormal intracranial calcifications. There is subtle decreased density in the deep white matter of both cerebral hemispheres consistent with chronic small vessel ischemic change.  At bone window settings the observed portions of the paranasal sinuses and mastoid air cells are clear with the exception of the left frontal sinus which is opacified. There is no evidence of an acute skull fracture nor of a cephalohematoma.  IMPRESSION: 1. There is no evidence of an acute intracranial hemorrhage nor of an evolving ischemic infarction. 2. There is mild diffuse cerebral and cerebellar atrophy with compensatory ventriculomegaly. These findings are not out of proportion to the patient's age. 3. There is opacification of the left frontal sinus which may reflect acute inflammation. I have no previous studies with which to compare.   Electronically Signed   By: David  [place]   On: 07/23/2013 09:29   Mr Brain Wo Contrast  07/23/2013   CLINICAL DATA:  Altered mental status.  New confusion.  EXAM: MRI HEAD WITHOUT CONTRAST  TECHNIQUE: Multiplanar, multiecho pulse sequences of the brain and surrounding structures were obtained without intravenous contrast.  COMPARISON:  Head CT 07/23/2013  FINDINGS: There is a 6 mm focus of restricted diffusion involving the left caudate/corona radiata with an additional 1 cm focus of restricted diffusion involving the anterior left thalamus. There is no intracranial hemorrhage. Periventricular T2 hyperintensities are nonspecific but compatible with mild chronic small vessel ischemic disease. There is moderate cerebral atrophy. There is no evidence of  mass, midline shift, or extra-axial fluid collection. Major intracranial vascular flow voids are unremarkable. Orbits are normal. Left frontal sinus mucosal thickening is noted.  IMPRESSION: Small acute infarcts involving the left caudate/corona radiata and left thalamus. No hemorrhage.   Electronically Signed   By: Sebastian AcheAllen  Grady   On: 07/23/2013 14:06    EKG: Independently reviewed.    Assessment/Plan Active Problems:   Stroke  Acute CVA (cerebrovascular accident)   Acute CVA MRI shows Small acute infarcts involving the left caudate/corona radiata and left thalamus. No hemorrhage Patient already on aspirin and home PT/OT/speech eval 2-D echo, carotid Doppler Neurochecks Admit to telemetry May need to start Plavix and able to swallow Neurology consultation Continue statin   Hypertension Continue Norvasc When necessary labetalol  Diabetes type 2 A1c, lipid panel Start the patient on sliding scale insulin Accu-Cheks   Code Status:   full Family Communication: bedside Disposition Plan: admit   Time spent: 70 mins   [facility] Pager [phone]  If 7PM-7AM, please contact night-coverage [email or web address] Password [email or web address] 07/23/2013, 3:07 PM

## 2013-07-23 NOTE — ED Notes (Signed)
Condom cath came off patient. Adult brief placed prior to going to MRI

## 2013-07-23 NOTE — ED Provider Notes (Signed)
CSN: [id number]     Arrival date & time 07/23/13  0809 History   First MD Initiated Contact with Patient 07/23/13 [phone]     Chief Complaint  Patient presents with  . Fall  . Altered Mental Status   Level V caveat for confusion  (Consider location/radiation/quality/duration/timing/severity/associated sxs/prior Treatment) HPI Patient was brought into the emergency department by EMS this morning. He was found on the floor this morning per his wife. His wife reports is more confused than normal. During the course of conversation sometimes patient has word salad where his words although well formed are not appropriate for what he's talking about. Patient states he feels fine. He denies any pain including headache, chest pain, or abdominal pain. He states "I feel fine". He also states he does not know what all the fuss is about and why he is here.  09:30 wife and daughter are here now. Wife states she was in the bathroom and heard him fall. He was in his bedroom and was found sitting beside his bed with his feet in the same direction as the foot of the bed sitting parallel to the bed. They also report he was not this confused yesterday. They state he normally knows the year and knows his family. He has not recognized his wife yet this morning. He thinks she is his deceased mother or sister. He did recognize his daughter. They also noticed the inappropriate words when he was speaking. They state this has never happened before. Wife reports she was unable to get him to stand up even with help and a walker. She reports he had some confusion while on medications after his total knee replacement about 6 months ago however it improved. She states he would know the year and month.   PCP Dr Drue Novel  Past Medical History  Diagnosis Date  . BPH (benign prostatic hypertrophy)   . Diabetes mellitus   . Hypertension   . Hyperlipidemia   . Diverticulitis   . ED (erectile dysfunction)   . Glaucoma     sees eye  doctor routinely  . Allergic rhinitis   . Subclinical hypothyroidism   . Arthritis   . Blood dyscrasia    Past Surgical History  Procedure Laterality Date  . Inguinal hernia repair    . Colectomy      w/ reversal due to divertiuclitis per patient in 1990s aprox  . Sbo repair    . Total knee arthroplasty Right 10/27/2012    Procedure: TOTAL KNEE ARTHROPLASTY;  Surgeon: Loreta Ave, MD;  Location: [facility] OR;  Service: Orthopedics;  Laterality: Right;   Family History  Problem Relation Age of Onset  . Lupus Sister   . Stroke Brother 35    CVA, no FH premature CAD  . Diabetes Other     granddaughter  . Colon cancer Neg Hx   . Prostate cancer Other     cousin, age 40  . CAD Mother     "enlarged heart"   History  Substance Use Topics  . Smoking status: Former Smoker    Types: Cigarettes  . Smokeless tobacco: Never Used     Comment: used to smoke rarely  . Alcohol Use: Yes     Comment: rarely  lives at home Lives with spouse  Review of Systems  All other systems reviewed and are negative.    Allergies  Beef-derived products; Pork-derived products; and Vasotec  Home Medications   Current Outpatient Rx  Name  Route  Sig  Dispense  Refill  . amLODipine (NORVASC) 5 MG tablet      take 1 tablet by mouth once daily   30 tablet   5   . atorvastatin (LIPITOR) 20 MG tablet      TAKE 1 TABLET DAILY   90 tablet   1   . bimatoprost (LUMIGAN) 0.03 % ophthalmic solution      1 drop at bedtime.           [name] doxazosin (CARDURA) 2 MG tablet      take 1 tablet by mouth at bedtime   30 tablet   5   . fluticasone (FLONASE) 50 MCG/ACT nasal spray   Nasal   Place 2 sprays into the nose as needed for allergies.          [name] glucose blood test strip      Use as instructed   100 each   12     One touch ultra   . HYDROcodone-acetaminophen (NORCO) 10-325 MG per tablet   Oral   Take 1-2 tablets by mouth every 4 (four) hours as needed (breakthrough pain).   30  tablet      . metFORMIN (GLUCOPHAGE) 1000 MG tablet      TAKE 1 TABLET TWICE A DAY WITH MEALS   180 tablet   1   . methocarbamol (ROBAXIN) 500 MG tablet   Oral   Take 1 tablet (500 mg total) by mouth every 6 (six) hours as needed (spasms).         . ONE TOUCH LANCETS MISC      Check once daily. Dx:250.00  One touch ultra   200 each   12   . saxagliptin HCl (ONGLYZA) 5 MG TABS tablet      take 1 tablet by mouth once daily   30 tablet   6   . tamsulosin (FLOMAX) 0.4 MG CAPS capsule   Oral   Take 1 capsule (0.4 mg total) by mouth daily.   30 capsule   6    BP 140/61  Pulse 63  Temp(Src) 97.4 F (36.3 C)  Resp 18  Ht 5\' 6"  (1.676 m)  Wt 160 lb (72.576 kg)  BMI 25.84 kg/m2  SpO2 100%  Vital signs normal   Physical Exam  Nursing note and vitals reviewed. Constitutional: He appears well-developed and well-nourished.  Non-toxic appearance. He does not appear ill. No distress.  HENT:  Head: Normocephalic and atraumatic.  Right Ear: External ear normal.  Left Ear: External ear normal.  Nose: Nose normal. No mucosal edema or rhinorrhea.  Mouth/Throat: Oropharynx is clear and moist and mucous membranes are normal. No dental abscesses or uvula swelling.  Eyes: Conjunctivae and EOM are normal. Pupils are equal, round, and reactive to light.  Neck: Normal range of motion and full passive range of motion without pain. Neck supple.  Cardiovascular: Normal rate, regular rhythm and normal heart sounds.  Exam reveals no gallop and no friction rub.   No murmur heard. Pulmonary/Chest: Effort normal and breath sounds normal. No respiratory distress. He has no wheezes. He has no rhonchi. He has no rales. He exhibits no tenderness and no crepitus.  Abdominal: Soft. Normal appearance and bowel sounds are normal. He exhibits no distension. There is no tenderness. There is no rebound and no guarding.  Musculoskeletal: Normal range of motion. He exhibits no edema and no tenderness.   Moves all extremities well.   Neurological: He is alert. He has normal  strength. No cranial nerve deficit.  When asked patient states it is [birth year], 1942. When asked who the president is he repeats "president of a company?" And went redirected as to the president of the country he states the president is [place] (not [place])  Patient has mild weakness of his right grip compared to left and he states he is right-handed. His face is symmetrical. There is no other focal weakness seen.  Skin: Skin is warm, dry and intact. No rash noted. No erythema. No pallor.  Psychiatric: He has a normal mood and affect. His speech is normal and behavior is normal. His mood appears not anxious.    ED Course  Procedures (including critical care time)  09:30 Discussed his test results were all normal, UA still pending. CT does not show any acute findings. Discussed need for MR to pick up stroke not seen on CT and they are agreeable.   14:30 Wife and daughter given results on MR and are agreeable for admission  14:37 Dr Susie CassetteAbrol, admit to team 10, tele, wants me to consult neuro  14:46 Dr Thad Rangereynolds will consult on patient.    Labs Review Results for orders placed during the hospital encounter of 07/23/13  CBC      Result Value Range   WBC 5.4  4.0 - 10.5 K/uL   RBC 4.92  4.22 - 5.81 MIL/uL   Hemoglobin 15.0  13.0 - 17.0 g/dL   HCT 16.143.5  09.639.0 - 04.552.0 %   MCV 88.4  78.0 - 100.0 fL   MCH 30.5  26.0 - 34.0 pg   MCHC 34.5  30.0 - 36.0 g/dL   RDW 40.914.9  81.111.5 - 91.415.5 %   Platelets 134 (*) 150 - 400 K/uL  COMPREHENSIVE METABOLIC PANEL      Result Value Range   Sodium 140  137 - 147 mEq/L   Potassium 4.7  3.7 - 5.3 mEq/L   Chloride 101  96 - 112 mEq/L   CO2 26  19 - 32 mEq/L   Glucose, Bld 102 (*) 70 - 99 mg/dL   BUN 18  6 - 23 mg/dL   Creatinine, Ser 7.821.12  0.50 - 1.35 mg/dL   Calcium 9.4  8.4 - 95.610.5 mg/dL   Total Protein 7.8  6.0 - 8.3 g/dL   Albumin 3.8  3.5 - 5.2 g/dL   AST 20  0 - 37 U/L   ALT 14  0 - 53  U/L   Alkaline Phosphatase 85  39 - 117 U/L   Total Bilirubin 0.6  0.3 - 1.2 mg/dL   GFR calc non Af Amer 60 (*) >90 mL/min   GFR calc Af Amer 70 (*) >90 mL/min  URINALYSIS, ROUTINE W REFLEX MICROSCOPIC      Result Value Range   Color, Urine YELLOW  YELLOW   APPearance CLEAR  CLEAR   Specific Gravity, Urine 1.013  1.005 - 1.030   pH 8.0  5.0 - 8.0   Glucose, UA NEGATIVE  NEGATIVE mg/dL   Hgb urine dipstick NEGATIVE  NEGATIVE   Bilirubin Urine NEGATIVE  NEGATIVE   Ketones, ur NEGATIVE  NEGATIVE mg/dL   Protein, ur NEGATIVE  NEGATIVE mg/dL   Urobilinogen, UA 0.2  0.0 - 1.0 mg/dL   Nitrite NEGATIVE  NEGATIVE   Leukocytes, UA NEGATIVE  NEGATIVE  TROPONIN I      Result Value Range   Troponin I <0.30  <0.30 ng/mL  APTT      Result Value Range  aPTT 30  24 - 37 seconds  PROTIME-INR      Result Value Range   Prothrombin Time 12.8  11.6 - 15.2 seconds   INR 0.98  0.00 - 1.49   Laboratory interpretation all normal     Imaging Review Ct Head Wo Contrast (only If Suspected Head Trauma And/or Pt Is On Anticoagulant)  07/23/2013   CLINICAL DATA:  Mental status change with slurred speech and gait ease disturbance. , history of dementia  EXAM: CT HEAD WITHOUT CONTRAST  TECHNIQUE: Contiguous axial images were obtained from the base of the skull through the vertex without intravenous contrast.  COMPARISON:  None.  FINDINGS: There is mild diffuse cerebral and cerebellar atrophy with compensatory ventriculomegaly. There is no shift of the midline. There is no evidence of an acute intracranial hemorrhage nor of an evolving ischemic infarction. There are no abnormal intracranial calcifications. There is subtle decreased density in the deep white matter of both cerebral hemispheres consistent with chronic small vessel ischemic change.  At bone window settings the observed portions of the paranasal sinuses and mastoid air cells are clear with the exception of the left frontal sinus which is opacified.  There is no evidence of an acute skull fracture nor of a cephalohematoma.  IMPRESSION: 1. There is no evidence of an acute intracranial hemorrhage nor of an evolving ischemic infarction. 2. There is mild diffuse cerebral and cerebellar atrophy with compensatory ventriculomegaly. These findings are not out of proportion to the patient's age. 3. There is opacification of the left frontal sinus which may reflect acute inflammation. I have no previous studies with which to compare.   Electronically Signed   By: David  [place]   On: 07/23/2013 09:29   Mr Brain Wo Contrast  07/23/2013   CLINICAL DATA:  Altered mental status.  New confusion.  EXAM: MRI HEAD WITHOUT CONTRAST  TECHNIQUE: Multiplanar, multiecho pulse sequences of the brain and surrounding structures were obtained without intravenous contrast.  COMPARISON:  Head CT 07/23/2013  FINDINGS: There is a 6 mm focus of restricted diffusion involving the left caudate/corona radiata with an additional 1 cm focus of restricted diffusion involving the anterior left thalamus. There is no intracranial hemorrhage. Periventricular T2 hyperintensities are nonspecific but compatible with mild chronic small vessel ischemic disease. There is moderate cerebral atrophy. There is no evidence of mass, midline shift, or extra-axial fluid collection. Major intracranial vascular flow voids are unremarkable. Orbits are normal. Left frontal sinus mucosal thickening is noted.  IMPRESSION: Small acute infarcts involving the left caudate/corona radiata and left thalamus. No hemorrhage.   Electronically Signed   By: Sebastian Ache   On: 07/23/2013 14:06    EKG Interpretation    Date/Time:  Saturday July 23 2013 08:19:48 EST Ventricular Rate:  81 PR Interval:  166 QRS Duration: 78 QT Interval:  367 QTC Calculation: 426 R Axis:   36 Text Interpretation:  Sinus rhythm Probable left atrial enlargement Artifact in lead(s) I II III aVL aVF V4 V5 V6 Electrode noise No significant  change since last tracing Confirmed by Carnelia Oscar  MD-I, Christpoher Sievers ([id number]) on 07/23/2013 8:30:21 AM            MDM   1. Stroke     Plan admission  Devoria Albe, MD, Franz Dell, MD 07/23/13 [phone]

## 2013-07-23 NOTE — Consult Note (Addendum)
Referring Physician: Dr Susie Cassette    Chief Complaint: Stroke  HPI: Christian Erickson is an 80 y.o. male with no previous history of strokes who was seen in the emergency department today, 07/23/2013, after his wife found him on the floor at home this morning with right sided weakness, dysarthria, and inability to follow commands. The patient has a history of early dementia which has been present for approximately one year. The patient's wife reports that his symptoms have been getting worse over the past month with increased forgetfulness and irritability. He had knee surgery in March of 2014. Since that time he has been getting his days and nights reversed. He typically stays up all night and sleeps all day. He also has a history of alcohol use and has been drinking a fair amount of wine during the night.  This morning at approximately 5 AM the patient's wife was awakened by a loud noise. She got out of bed and found her husband on the floor unable to get up and unable to follow commands. He had right-sided weakness and dysarthria. EMS was summoned and he was brought to the [facility] emergency department this morning. An MRI revealed small acute infarcts involving the left caudate/corona radiata and left thalamus. No hemorrhage was noted. The patient is supposed to be taking aspirin 81 mg daily; however, his wife reports that he has been inconsistent with his medications in part due to to memory problems but also secondary to noncompliance. In the emergency room this afternoon the patient's strength appears to have improved significantly although he remains confused and has difficulty following commands. He will be admitted for a full stroke workup.   Date last known well: Date: 07/22/2013 Time last known well: Unable to determine tPA Given: No t-PA was given as the patient was outside of the treatment window and the exact time of onset of symptoms was unknown.  Past Medical History  Diagnosis Date  . BPH (benign  prostatic hypertrophy)   . Diabetes mellitus   . Hypertension   . Hyperlipidemia   . Diverticulitis   . ED (erectile dysfunction)   . Glaucoma     sees eye doctor routinely  . Allergic rhinitis   . Subclinical hypothyroidism   . Arthritis   . Blood dyscrasia     Past Surgical History  Procedure Laterality Date  . Inguinal hernia repair    . Colectomy      w/ reversal due to divertiuclitis per patient in 1990s aprox  . Sbo repair    . Total knee arthroplasty Right 10/27/2012    Procedure: TOTAL KNEE ARTHROPLASTY;  Surgeon: Loreta Ave, MD;  Location: [facility] OR;  Service: Orthopedics;  Laterality: Right;    Family History  Problem Relation Age of Onset  . Lupus Sister   . Stroke Brother 73    CVA, no FH premature CAD  . Diabetes Other     granddaughter  . Colon cancer Neg Hx   . Prostate cancer Other     cousin, age 37  . CAD Mother     "enlarged heart"  The patient has a sister with dementia.  Social History:  reports that he has quit smoking. His smoking use included Cigarettes. He smoked 0.00 packs per day. He has never used smokeless tobacco. He reports that he drinks alcohol. He reports that he does not use illicit drugs. active - still drives.  Allergies:  Allergies  Allergen Reactions  . Beef-Derived Products Swelling  . Pork-Derived  Products Swelling  . Vasotec     Angioedema, see OV 09-05-11    Medications:  Current Facility-Administered Medications  Medication Dose Route Frequency Provider Last Rate Last Dose  . amLODipine (NORVASC) tablet 10 mg  10 mg Oral Daily Richarda OverlieNayana Abrol, MD      . aspirin suppository 300 mg  300 mg Rectal Daily Richarda OverlieNayana Abrol, MD       Or  . aspirin tablet 325 mg  325 mg Oral Daily Richarda OverlieNayana Abrol, MD      . atorvastatin (LIPITOR) tablet 20 mg  20 mg Oral q1800 Richarda OverlieNayana Abrol, MD      . enoxaparin (LOVENOX) injection 40 mg  40 mg Subcutaneous Q24H Nayana Abrol, MD      . fluticasone (FLONASE) 50 MCG/ACT nasal spray 2 spray  2 spray Each  Nare Daily PRN Richarda OverlieNayana Abrol, MD      . insulin aspart (novoLOG) injection 0-15 Units  0-15 Units Subcutaneous TID WC Richarda OverlieNayana Abrol, MD      . labetalol (NORMODYNE,TRANDATE) injection 10 mg  10 mg Intravenous QID PRN Richarda OverlieNayana Abrol, MD      . linagliptin (TRADJENTA) tablet 5 mg  5 mg Oral Daily Nayana Abrol, MD      . senna-docusate (Senokot-S) tablet 1 tablet  1 tablet Oral QHS PRN Richarda OverlieNayana Abrol, MD      . tamsulosin (FLOMAX) capsule 0.4 mg  0.4 mg Oral Daily Richarda OverlieNayana Abrol, MD       Current Outpatient Prescriptions  Medication Sig Dispense Refill  . amLODipine (NORVASC) 5 MG tablet take 1 tablet by mouth once daily  30 tablet  5  . atorvastatin (LIPITOR) 20 MG tablet TAKE 1 TABLET DAILY  90 tablet  1  . bimatoprost (LUMIGAN) 0.03 % ophthalmic solution 1 drop at bedtime.        [name]. doxazosin (CARDURA) 2 MG tablet take 1 tablet by mouth at bedtime  30 tablet  5  . fluticasone (FLONASE) 50 MCG/ACT nasal spray Place 2 sprays into the nose as needed for allergies.       [name]. glucose blood test strip Use as instructed  100 each  12  . HYDROcodone-acetaminophen (NORCO) 10-325 MG per tablet Take 1-2 tablets by mouth every 4 (four) hours as needed (breakthrough pain).  30 tablet    . metFORMIN (GLUCOPHAGE) 1000 MG tablet TAKE 1 TABLET TWICE A DAY WITH MEALS  180 tablet  1  . methocarbamol (ROBAXIN) 500 MG tablet Take 1 tablet (500 mg total) by mouth every 6 (six) hours as needed (spasms).      . ONE TOUCH LANCETS MISC Check once daily. Dx:250.00  One touch ultra  200 each  12  . saxagliptin HCl (ONGLYZA) 5 MG TABS tablet take 1 tablet by mouth once daily  30 tablet  6  . tamsulosin (FLOMAX) 0.4 MG CAPS capsule Take 1 capsule (0.4 mg total) by mouth daily.  30 capsule  6  . [DISCONTINUED] cloNIDine (CATAPRES) 0.2 MG tablet Take 0.2 mg by mouth 2 (two) times daily.       I have reviewed the patient's current medications.  ROS: History obtained from The patient's wife  General ROS: negative for - chills,  fatigue, fever, night sweats, weight gain or weight loss. Positive for sleep disorder. Psychological ROS: negative for - behavioral disorder, hallucinations, memory difficulties, mood swings or suicidal ideation. Positive for irritability. Ophthalmic ROS: negative for - blurry vision, double vision, eye pain or loss of vision ENT ROS: negative for -  epistaxis, nasal discharge, oral lesions, sore throat, tinnitus or vertigo Allergy and Immunology ROS: negative for - hives or itchy/watery eyes Hematological and Lymphatic ROS: negative for - bleeding problems, bruising or swollen lymph nodes Endocrine ROS: negative for - galactorrhea, hair pattern changes, polydipsia/polyuria or temperature intolerance Respiratory ROS: negative for -  hemoptysis, shortness of breath or wheezing. Positive for nonproductive cough. Cardiovascular ROS: negative for - chest pain, dyspnea on exertion, edema or irregular heartbeat Gastrointestinal ROS: negative for - abdominal pain, hematemesis, nausea/vomiting or stool incontinence. Positive for diarrhea. Genito-Urinary ROS: negative for - dysuria, hematuria, incontinence. Positive for urinary frequency/urgency Musculoskeletal ROS: negative for - joint swelling or muscular weakness. Positive for knee pain and arthritis of the hands. Neurological ROS: as noted in HPI Dermatological ROS: negative for rash and skin lesion changes   Physical Examination: Blood pressure 166/65, pulse 50, temperature 97.4 F (36.3 C), resp. rate 13, height 5\' 6"  (1.676 m), weight 72.576 kg (160 lb), SpO2 96.00%.  Neurologic Examination: Mental Status: Somewhat lethargic but awakens easily. Oriented x1.  Difficulty following 2 and 3 step commands. Speech fluent at times but at other times non-fluent.  Conversation often not appropriate to questions being asked.  +Confabulatory. Unable to do simple calculations. Cranial Nerves: II: Discs not visualized; Visual fields grossly normal, pupils  equal, round, reactive to light and accommodation III,IV, VI: ptosis not present, extra-ocular motions intact bilaterally V,VII: smile symmetric, facial light touch sensation normal bilaterally VIII: hearing normal bilaterally IX,X: gag reflex present XI: bilateral shoulder shrug XII: midline tongue extension Motor: Right : Upper extremity   5/5    Left:     Upper extremity   5/5 (Except grip strength 3-4/5 bilaterally)  Lower extremity   5/5     Lower extremity   5/5 Tone and bulk:normal tone throughout; no atrophy noted Sensory: Pinprick and light touch intact throughout, bilaterally Deep Tendon Reflexes: 1+ and symmetric throughout Plantars: Right: downgoing   Left: downgoing Cerebellar: Finger to nose performed with much difficulty bilaterally secondary to inability to follow instructions. The patient was unable to understand heel-to-shin testing. Gait: Deferred CV: Distal pulses weak to absent  Laboratory Studies:  Basic Metabolic Panel:  Recent Labs Lab 07/23/13 0836  NA 140  K 4.7  CL 101  CO2 26  GLUCOSE 102*  BUN 18  CREATININE 1.12  CALCIUM 9.4    Liver Function Tests:  Recent Labs Lab 07/23/13 0836  AST 20  ALT 14  ALKPHOS 85  BILITOT 0.6  PROT 7.8  ALBUMIN 3.8   No results found for this basename: LIPASE, AMYLASE,  in the last 168 hours No results found for this basename: AMMONIA,  in the last 168 hours  CBC:  Recent Labs Lab 07/23/13 0836 07/23/13 1521  WBC 5.4 4.8  HGB 15.0 15.1  HCT 43.5 43.1  MCV 88.4 87.6  PLT 134* 120*    Cardiac Enzymes:  Recent Labs Lab 07/23/13 0836  TROPONINI <0.30    BNP: No components found with this basename: POCBNP,   CBG: No results found for this basename: GLUCAP,  in the last 168 hours  Microbiology: Results for orders placed in visit on 01/21/13  URINE CULTURE     Status: None   Collection Time    01/21/13 10:40 AM      Result Value Range Status   Colony Count NO GROWTH   Final    Organism ID, Bacteria NO GROWTH   Final    Coagulation Studies:  Recent  Labs  07/23/13 0836  LABPROT 12.8  INR 0.98    Urinalysis:  Recent Labs Lab 07/23/13 0926  COLORURINE YELLOW  LABSPEC 1.013  PHURINE 8.0  GLUCOSEU NEGATIVE  HGBUR NEGATIVE  BILIRUBINUR NEGATIVE  KETONESUR NEGATIVE  PROTEINUR NEGATIVE  UROBILINOGEN 0.2  NITRITE NEGATIVE  LEUKOCYTESUR NEGATIVE    Lipid Panel:    Component Value Date/Time   CHOL 166 01/21/2013 1040   TRIG 43.0 01/21/2013 1040   HDL 49.30 01/21/2013 1040   CHOLHDL 3 01/21/2013 1040   VLDL 8.6 01/21/2013 1040   LDLCALC 108* 01/21/2013 1040    HgbA1C:  Lab Results  Component Value Date   HGBA1C 6.9* 06/06/2013    Urine Drug Screen:   No results found for this basename: labopia, cocainscrnur, labbenz, amphetmu, thcu, labbarb    Alcohol Level: No results found for this basename: ETH,  in the last 168 hours  Other results: EKG: Sinus rhythm rate 81 beats per minute  Imaging:  Ct Head Wo Contrast (only If Suspected Head Trauma And/or Pt Is On Anticoagulant) 07/23/2013    1. There is no evidence of an acute intracranial hemorrhage nor of an evolving ischemic infarction. 2. There is mild diffuse cerebral and cerebellar atrophy with compensatory ventriculomegaly. These findings are not out of proportion to the patient's age. 3. There is opacification of the left frontal sinus which may reflect acute inflammation. I have no previous studies with which to compare.     Mr Brain Wo Contrast 07/23/2013    Small acute infarcts involving the left caudate/corona radiata and left thalamus. No hemorrhage.     Delton See PA-C [facility] Pager [phone] 07/23/2013, 5:07 PM   Patient seen and examined.  Clinical course and management discussed.  Necessary edits performed.  I agree with the above.  Assessment and plan of care developed and discussed below.    Assessment: 80 y.o. male found on the floor by his wife this  morning at approximately 5 AM with right-sided weakness, dysarthria, and inability to follow commands.  MRI has been reviewed and shows acute small infarcts involving the left caudate/corona radiata and left thalamus. Neurologic examination has improved significantly but patient continues to have language deficits.  Patient has been noncompliant with medications (bottles investigated and pill counts performed).  Further work up recommended.  Stroke Risk Factors - diabetes mellitus, hyperlipidemia, hypertension and alcohol use  Plan: 1. HgbA1c, fasting lipid panel 2. PT consult, OT consult, Speech consult 3. Echocardiogram 4. Carotid dopplers 5. Prophylactic therapy-Antiplatelet med: Aspirin - dose 325 mg daily 6. Risk factor modification 7. Telemetry monitoring 8. Frequent neuro checks  Thana Farr, MD [facility] [phone]  07/23/2013  6:07 PM

## 2013-07-23 NOTE — ED Notes (Signed)
Per [facility], pt from home found on floor by wife this morning. LKW 2200 last night. Does have a left sided gaze, incomprehensible speech and slurred at times. Does have hx of dementia, newly dx. Wife states he is more confused than usual. SR on the monitor. CBG 131. 20g to LAC.

## 2013-07-23 NOTE — ED Notes (Signed)
Patient returned from MRI.

## 2013-07-24 ENCOUNTER — Inpatient Hospital Stay (HOSPITAL_COMMUNITY): Payer: Medicare Other

## 2013-07-24 DIAGNOSIS — N400 Enlarged prostate without lower urinary tract symptoms: Secondary | ICD-10-CM

## 2013-07-24 DIAGNOSIS — E785 Hyperlipidemia, unspecified: Secondary | ICD-10-CM

## 2013-07-24 DIAGNOSIS — R413 Other amnesia: Secondary | ICD-10-CM

## 2013-07-24 DIAGNOSIS — E119 Type 2 diabetes mellitus without complications: Secondary | ICD-10-CM

## 2013-07-24 DIAGNOSIS — I10 Essential (primary) hypertension: Secondary | ICD-10-CM

## 2013-07-24 LAB — GLUCOSE, CAPILLARY
GLUCOSE-CAPILLARY: 116 mg/dL — AB (ref 70–99)
Glucose-Capillary: 189 mg/dL — ABNORMAL HIGH (ref 70–99)
Glucose-Capillary: 163 mg/dL — ABNORMAL HIGH (ref 70–99)
Glucose-Capillary: 185 mg/dL — ABNORMAL HIGH (ref 70–99)
Glucose-Capillary: 121 mg/dL — ABNORMAL HIGH (ref 70–99)

## 2013-07-24 LAB — LIPID PANEL
CHOL/HDL RATIO: 3 ratio
Cholesterol: 164 mg/dL (ref 0–200)
HDL: 55 mg/dL (ref >39)
LDL Cholesterol: 98 mg/dL (ref 0–99)
Triglycerides: 55 mg/dL (ref <150)
VLDL: 11 mg/dL (ref 0–40)

## 2013-07-24 LAB — HEMOGLOBIN A1C
Hgb A1c MFr Bld: 6.9 % — ABNORMAL HIGH (ref <5.7)
Hgb A1c MFr Bld: 6.8 % — ABNORMAL HIGH (ref <5.7)
MEAN PLASMA GLUCOSE: 148 mg/dL — AB (ref <117)
Mean Plasma Glucose: 151 mg/dL — ABNORMAL HIGH (ref <117)

## 2013-07-24 MED ORDER — LORAZEPAM 2 MG/ML IJ SOLN
1.0000 mg | Freq: Four times a day (QID) | INTRAMUSCULAR | Status: AC | PRN
  Administered 2013-07-25: 1 mg via INTRAVENOUS
  Filled 2013-07-24: qty 1

## 2013-07-24 MED ORDER — ADULT MULTIVITAMIN W/MINERALS CH
1.0000 | ORAL_TABLET | Freq: Every day | ORAL | Status: DC
  Administered 2013-07-24 – 2013-07-27 (×4): 1 via ORAL
  Filled 2013-07-24 (×5): qty 1

## 2013-07-24 MED ORDER — IOHEXOL 350 MG/ML SOLN
50.0000 mL | Freq: Once | INTRAVENOUS | Status: AC | PRN
Start: 2013-07-24 — End: 2013-07-24
  Administered 2013-07-24: 50 mL via INTRAVENOUS

## 2013-07-24 MED ORDER — STROKE: EARLY STAGES OF RECOVERY BOOK
Freq: Once | Status: AC
  Administered 2013-07-24: 08:00:00
  Filled 2013-07-24: qty 1

## 2013-07-24 MED ORDER — LORAZEPAM 1 MG PO TABS: 1.0000 mg | ORAL_TABLET | Freq: Four times a day (QID) | ORAL | Status: AC | PRN

## 2013-07-24 MED ORDER — THIAMINE HCL 100 MG/ML IJ SOLN
100.0000 mg | Freq: Every day | INTRAMUSCULAR | Status: DC
  Filled 2013-07-24 (×2): qty 1

## 2013-07-24 MED ORDER — FOLIC ACID 1 MG PO TABS
1.0000 mg | ORAL_TABLET | Freq: Every day | ORAL | Status: DC
  Administered 2013-07-24 – 2013-07-27 (×4): 1 mg via ORAL
  Filled 2013-07-24 (×4): qty 1

## 2013-07-24 MED ORDER — VITAMIN B-1 100 MG PO TABS
100.0000 mg | ORAL_TABLET | Freq: Every day | ORAL | Status: DC
  Administered 2013-07-24 – 2013-07-27 (×4): 100 mg via ORAL
  Filled 2013-07-24 (×4): qty 1

## 2013-07-24 MED ORDER — ATORVASTATIN CALCIUM 40 MG PO TABS
40.0000 mg | ORAL_TABLET | Freq: Every day | ORAL | Status: DC
  Administered 2013-07-24 – 2013-07-26 (×3): 40 mg via ORAL
  Filled 2013-07-24 (×4): qty 1

## 2013-07-24 NOTE — Evaluation (Signed)
Occupational Therapy Evaluation Patient Details Name: JAHMANI STAUP MRN: [id number] DOB: 29-Oct-1930 Today's Date: 07/24/2013 Time: 4098-1191 OT Time Calculation (min): 36 min  OT Assessment / Plan / Recommendation History of present illness JAVYON FONTAN is an 80 y.o. male with no previous history of strokes who was seen in the emergency department, 07/23/2013, after his wife found him on the floor at home this morning with right sided weakness, dysarthria, and inability to follow commands. The patient has a history of early dementia which has been present for approximately one year.  MRI showed Small acute infarcts involving the left caudate/corona radiata and left thalamus.   Clinical Impression   Pt presents with below problem list. Spoke with family about d/c plans and family wanting pt to go home. OT spoke about pt requiring 24/7 assist at this time. Pt independent with ADLs, PTA. Feel pt will benefit from acute OT to increase independence prior to d/c.     OT Assessment  Patient needs continued OT Services    Follow Up Recommendations  SNF    Barriers to Discharge Decreased caregiver support    Equipment Recommendations  Other (comment) (defer to next venue)    Recommendations for Other Services    Frequency  Min 2X/week    Precautions / Restrictions Precautions Precautions: Fall Precaution Comments: Decreased cognition/safety Restrictions Weight Bearing Restrictions: No   Pertinent Vitals/Pain No pain reported.     ADL  Grooming: Wash/dry face;Teeth care;Min guard Where Assessed - Grooming: Supported standing Lower Body Bathing: Min guard Where Assessed - Lower Body Bathing: Unsupported sit to stand Lower Body Dressing: Min guard Where Assessed - Lower Body Dressing: Unsupported sit to stand Toilet Transfer: Min Pension scheme manager Method: Sit to Barista: Bedside commode Tub/Shower Transfer: Minimal assistance Tub/Shower Transfer  Method: Science writer: Walk in shower Equipment Used: Gait belt Transfers/Ambulation Related to ADLs: Min guard for ambulation, Min A for shower transfer. Min guard for sit <> stand transfers. ADL Comments: Recommended sitting down on chair for bathing and sitting down to get dressed (educated family and pt). Recommended pt having someone with him for shower transfer. Spoke with pt at end of session about d/c plans and pt's baseline level. Told them to continue to orient patient and to not give him too many things to do at once. Also, discussed how he needs redirection and has decreased attention. Several cues to take socks off, but pt able to don/doff both of them.    OT Diagnosis: Altered mental status  OT Problem List: Impaired balance (sitting and/or standing);Decreased cognition;Decreased safety awareness;Decreased knowledge of use of DME or AE;Decreased knowledge of precautions OT Treatment Interventions: Self-care/ADL training;Cognitive remediation/compensation;Patient/family education;Balance training;Therapeutic activities;DME and/or AE instruction   OT Goals(Current goals can be found in the care plan section) Acute Rehab OT Goals Patient Stated Goal: Unable to state OT Goal Formulation: With patient/family Time For Goal Achievement: 07/31/13 Potential to Achieve Goals: Good  Visit Information  Last OT Received On: 07/24/13 Assistance Needed: +1 History of Present Illness: ADRYEN COOKSON is an 80 y.o. male with no previous history of strokes who was seen in the emergency department, 07/23/2013, after his wife found him on the floor at home this morning with right sided weakness, dysarthria, and inability to follow commands. The patient has a history of early dementia which has been present for approximately one year.  MRI showed Small acute infarcts involving the left caudate/corona radiata and left thalamus.  Prior Functioning     Home  Living Family/patient expects to be discharged to:: Skilled nursing facility Living Arrangements: Spouse/significant other Available Help at Discharge: Skilled Nursing Facility (wife unable to provide level of care needed) Type of Home: House Home Access: Stairs to enter Entergy CorporationEntrance Stairs-Number of Steps: 4 Entrance Stairs-Rails: Right;Left Home Layout: One level;Laundry or work area in Pitney Bowesbasement Home Equipment: Environmental consultantWalker - 2 wheels;Cane - single point;Shower seat;Toilet riser Additional Comments: have walk-in and tub shower with shower chair Prior Function Level of Independence: Independent Comments: Patient was driving pta. Communication Communication: Expressive difficulties (nonsensical speech, difficulty finding words) Dominant Hand: Right         Vision/Perception Vision - History Baseline Vision:  (wears glasses most of the time)   Cognition  Cognition Arousal/Alertness: Awake/alert Behavior During Therapy: Impulsive Overall Cognitive Status: Impaired/Different from baseline Area of Impairment: Orientation;Attention;Problem solving;Memory;Safety/judgement Orientation Level: Disoriented to;Place;Situation;Time Current Attention Level: Sustained Memory: Decreased short-term memory Safety/Judgement: Decreased awareness of deficits;Decreased awareness of safety Problem Solving: Slow processing;Difficulty sequencing;Requires verbal cues General Comments: Difficult to assess due to communication difficulties. Pt requiring assistance and cues to figure out where to go to brush teeth and also what items to get and where to look for grooming tasks.    Extremity/Trunk Assessment Upper Extremity Assessment Upper Extremity Assessment: Overall WFL for tasks assessed Lower Extremity Assessment Lower Extremity Assessment: Defer to PT evaluation     Mobility Bed Mobility Overal bed mobility: Needs Assistance Bed Mobility: Supine to Sit Supine to sit: Supervision General bed mobility  comments: supervision for safety. Transfers Overall transfer level: Needs assistance Equipment used: None Transfers: Sit to/from Stand Sit to Stand: Min guard General transfer comment: cues for technique.     Exercise     Balance     End of Session OT - End of Session Equipment Utilized During Treatment: Gait belt Activity Tolerance: Patient tolerated treatment well Patient left: in chair;with call bell/phone within reach;with family/visitor present  GO      Earlie RavelingStraub, Oriya Kettering L OTR/L [phone] 07/24/2013, 5:36 PM

## 2013-07-24 NOTE — Progress Notes (Signed)
Stroke Team Progress Note  HISTORY Christian Erickson is an 80 y.o. male with no previous history of strokes who was seen in the emergency department, 07/23/2013, after his wife found him on the floor at home this morning with right sided weakness, dysarthria, and inability to follow commands. The patient has a history of early dementia which has been present for approximately one year. The patient's wife reports that his symptoms have been getting worse over the past month with increased forgetfulness and irritability. He had knee surgery in March of 2014. Since that time he has been getting his days and nights reversed. He typically stays up all night and sleeps all day. He also has a history of alcohol use and has been drinking a fair amount of wine during the night.  On the morning of admission, at approximately 5 AM, the patient's wife was awakened by a loud noise. She got out of bed and found her husband on the floor unable to get up and unable to follow commands. He had right-sided weakness and dysarthria. EMS was summoned and he was brought to the [facility] emergency department this morning. An MRI revealed small acute infarcts involving the left caudate/corona radiata and left thalamus. No hemorrhage was noted. The patient is supposed to be taking aspirin 81 mg daily; however, his wife reports that he has been inconsistent with his medications in part due to to memory problems but also secondary to noncompliance. In the emergency room the patient's strength appeared to have improved significantly although he remained confused and had difficulty following commands. He was admitted for a full stroke workup.    Date last known well: Date: 07/22/2013  Time last known well: Unable to determine  tPA Given: No t-PA was given as the patient was outside of the treatment window and the exact time of onset of symptoms was unknown.   SUBJECTIVE The patient's daughter and son-in-law are at the bedside today. The  patient is more alert and better able to follow commands but remains somewhat confused.  OBJECTIVE Most recent Vital Signs: Filed Vitals:   07/23/13 2339 07/24/13 0200 07/24/13 0400 07/24/13 0600  BP: 156/89 157/73 161/75 154/78  Pulse: 53 51 52 58  Temp: 97.8 F (36.6 C) 98.2 F (36.8 C) 98.8 F (37.1 C) 98.3 F (36.8 C)  TempSrc: Oral Oral Oral Oral  Resp: 18 18 18 18   Height:      Weight:      SpO2: 99% 98% 99% 98%   CBG (last 3)   Recent Labs  07/23/13 2139 07/24/13 0642  GLUCAP 189* 163*    IV Fluid Intake:     MEDICATIONS  .  stroke: mapping our early stages of recovery book   Does not apply Once  . amLODipine  10 mg Oral Daily  . aspirin  300 mg Rectal Daily   Or  . aspirin  325 mg Oral Daily  . atorvastatin  20 mg Oral q1800  . enoxaparin (LOVENOX) injection  40 mg Subcutaneous Q24H  . insulin aspart  0-15 Units Subcutaneous TID WC  . linagliptin  5 mg Oral Daily  . pneumococcal 23 valent vaccine  0.5 mL Intramuscular Tomorrow-1000  . tamsulosin  0.4 mg Oral Daily   PRN:  fluticasone, labetalol, senna-docusate  Diet:  Cardiac thin liquids Activity:  Up with assistance DVT Prophylaxis:  Lovenox  CLINICALLY SIGNIFICANT STUDIES Basic Metabolic Panel:  Recent Labs Lab 07/23/13 0836  NA 140  K 4.7  CL 101  CO2 26  GLUCOSE 102*  BUN 18  CREATININE 1.12  CALCIUM 9.4   Liver Function Tests:  Recent Labs Lab 07/23/13 0836  AST 20  ALT 14  ALKPHOS 85  BILITOT 0.6  PROT 7.8  ALBUMIN 3.8   CBC:  Recent Labs Lab 07/23/13 0836 07/23/13 1521  WBC 5.4 4.8  HGB 15.0 15.1  HCT 43.5 43.1  MCV 88.4 87.6  PLT 134* 120*   Coagulation:  Recent Labs Lab 07/23/13 0836  LABPROT 12.8  INR 0.98   Cardiac Enzymes:  Recent Labs Lab 07/23/13 0836  TROPONINI <0.30   Urinalysis:  Recent Labs Lab 07/23/13 0926  COLORURINE YELLOW  LABSPEC 1.013  PHURINE 8.0  GLUCOSEU NEGATIVE  HGBUR NEGATIVE  BILIRUBINUR NEGATIVE  KETONESUR NEGATIVE   PROTEINUR NEGATIVE  UROBILINOGEN 0.2  NITRITE NEGATIVE  LEUKOCYTESUR NEGATIVE   Lipid Panel    Component Value Date/Time   CHOL 166 01/21/2013 1040   TRIG 43.0 01/21/2013 1040   HDL 49.30 01/21/2013 1040   CHOLHDL 3 01/21/2013 1040   VLDL 8.6 01/21/2013 1040   LDLCALC 108* 01/21/2013 1040   HgbA1C  Lab Results  Component Value Date   HGBA1C 6.9* 07/23/2013    Urine Drug Screen:   No results found for this basename: labopia, cocainscrnur, labbenz, amphetmu, thcu, labbarb    Alcohol Level: No results found for this basename: ETH,  in the last 168 hours  Dg Chest 2 View 07/23/2013    1. There is no evidence of pneumonia nor atelectasis. 2. There is mild enlargement of the cardiac silhouette without pulmonary vascular congestion. 3. There is atherosclerotic tortuosity of the descending thoracic aorta which is stable.      Ct Head Wo Contrast (only If Suspected Head Trauma And/or Pt Is On Anticoagulant) 07/23/2013    1. There is no evidence of an acute intracranial hemorrhage nor of an evolving ischemic infarction. 2. There is mild diffuse cerebral and cerebellar atrophy with compensatory ventriculomegaly. These findings are not out of proportion to the patient's age. 3. There is opacification of the left frontal sinus which may reflect acute inflammation. I have no previous studies with which to compare.      Mr Brain Wo Contrast 07/23/2013    Small acute infarcts involving the left caudate/corona radiata and left thalamus. No hemorrhage.      2D Echocardiogram  pending  Carotid Doppler  Carotid Dopplers completed.  Preliminary report: There is severe calcific plaque at the origin of the ICA, bilaterally, with acoustic shadowing. 1-39% right ICA stenosis. 40-59% left ICA stenosis, however, I cannot insonate flow past the origin as either flow is shadowed out by severe plaque vs. occlusion or near occlusion.    EKG  - sinus rhythm rate 81 beats per minute  Therapy Recommendations  pending  Physical Exam   Neurologic Examination:   Mental Status:  Alert. Oriented x1. Able to follow 3 step commands. Speech fluent at times but at other times non-fluent. Conversation often not appropriate to questions being asked. +Confabulatory. Could perform simple calculations today which he could not do yesterday. Cranial Nerves:  II: Discs not visualized; Visual fields grossly normal, pupils equal, round, reactive to light and accommodation  III,IV, VI: ptosis not present, extra-ocular motions intact bilaterally  V,VII: smile symmetric, facial light touch sensation normal bilaterally  VIII: hearing normal bilaterally  IX,X: gag reflex present  XI: bilateral shoulder shrug  XII: midline tongue extension  Motor:  Right : Upper extremity 5/5 Left: Upper  extremity 5/5 ( Except grip strength 4/5 RUE )  Lower extremity 5/5 Lower extremity 5/5  Tone and bulk:normal tone throughout; no atrophy noted  Sensory: Pinprick and light touch intact throughout, bilaterally  Deep Tendon Reflexes: 1+ and symmetric throughout  Plantars:  Right: downgoing Left: downgoing  Cerebellar:  Finger to nose performed with mild tremor bilaterally. Heel-to-shin performed with mild difficulty. Gait: Deferred  CV: Distal pulses weak to absent   ASSESSMENT Mr. Dorie RankJoseph N Erickson is a 80 y.o. male presenting with right hemiparesis, dysarthria, and confusion. TPA was not given secondary to late presentation.[name].an MRI confirmed small acute infarcts involving the left caudate/corona radiata and left thalamus. Infarcts felt to be thrombotic secondary to small vessel disease. On aspirin 81 mg orally every day prior to admission. Now on aspirin 325 mg orally every day for secondary stroke prevention. Patient with resultant confusion and speech deficits. Work up underway.   Hypertension history  Hyperlipidemia - cholesterol 166 LDL 108 - Lipitor prior to admission  Alcohol abuse  Early dementia  Diabetes mellitus  - hemoglobin A1c 6.9  Thrombocytopenia  History of noncompliance  Hospital day # 1  TREATMENT/PLAN  Continue aspirin 325 mg orally every day for secondary stroke prevention.  Await 2-D echo   Await therapy evaluations  Risk factor modifications    Increase Lipitor to 40 mg daily - LDL goal less than 70  CTA head and neck L ICA disease by doppler, not sure of distal occlusion  Delton Seeavid Rinehuls PA-C [facility] Pager [phone] 07/24/2013, 7:46 AM  I have personally obtained a history, examined the patient, evaluated imaging results, and formulated the assessment and plan of care. I agree with the above. Lesly DukesWILLIS,Delita Chiquito KEITH

## 2013-07-24 NOTE — Progress Notes (Signed)
  Echocardiogram 2D Echocardiogram has been performed.  Nestor RampRoberts, Meryl Hubers M 07/24/2013, 3:42 PM

## 2013-07-24 NOTE — Evaluation (Signed)
Physical Therapy Evaluation Patient Details Name: Christian Erickson Lambe MRN: [id number] DOB: 07-Nov-1930 Today's Date: 07/24/2013 Time: [id number] PT Time Calculation (min): 33 min  PT Assessment / Plan / Recommendation History of Present Illness  Christian Erickson Mitton is an 80 y.o. male with no previous history of strokes who was seen in the emergency department, 07/23/2013, after his wife found him on the floor at home this morning with right sided weakness, dysarthria, and inability to follow commands. The patient has a history of early dementia which has been present for approximately one year.  MRI showed Small acute infarcts involving the left caudate/corona radiata and left thalamus.  Clinical Impression  Patient presents with problems listed below.  Will benefit from acute PT to maximize independence prior to discharge.  Patient with instability with high level balance activities, decreased cognition and safety awareness, and decreased expressive speech.  Recommend SNF for continued therapies prior to return home with wife.    PT Assessment  Patient needs continued PT services    Follow Up Recommendations  SNF    Does the patient have the potential to tolerate intense rehabilitation      Barriers to Discharge Decreased caregiver support      Equipment Recommendations  None recommended by PT    Recommendations for Other Services     Frequency Min 2X/week    Precautions / Restrictions Precautions Precautions: Fall Precaution Comments: Decreased cognition/safety Restrictions Weight Bearing Restrictions: No   Pertinent Vitals/Pain       Mobility  Bed Mobility Overal bed mobility: Needs Assistance Bed Mobility: Supine to Sit Supine to sit: Supervision General bed mobility comments: Assist for safety only. Transfers Overall transfer level: Needs assistance Equipment used: None Transfers: Sit to/from Stand Sit to Stand: Min assist General transfer comment: Cues for hand placement.   Assist for stability/balance initially. Ambulation/Gait Ambulation/Gait assistance: Min assist Ambulation Distance (Feet): 160 Feet Assistive device: 1 person hand held assist Gait Pattern/deviations: Step-through pattern;Decreased stride length Gait velocity interpretation: at or above normal speed for age/gender General Gait Details: Patient able to ambulate with hand hold assist and then no physical assist with good balance.  Assist for balance needed during turns.      Exercises     PT Diagnosis: Abnormality of gait;Altered mental status  PT Problem List: Decreased activity tolerance;Decreased balance;Decreased mobility;Decreased cognition;Decreased safety awareness PT Treatment Interventions: Gait training;Functional mobility training;Balance training;Cognitive remediation;Patient/family education     PT Goals(Current goals can be found in the care plan section) Acute Rehab PT Goals Patient Stated Goal: Unable to state PT Goal Formulation: With patient/family Time For Goal Achievement: 08/07/13 Potential to Achieve Goals: Good  Visit Information  Last PT Received On: 07/24/13 Assistance Needed: +1 History of Present Illness: Christian Erickson Branson is an 80 y.o. male with no previous history of strokes who was seen in the emergency department, 07/23/2013, after his wife found him on the floor at home this morning with right sided weakness, dysarthria, and inability to follow commands. The patient has a history of early dementia which has been present for approximately one year.  MRI showed Small acute infarcts involving the left caudate/corona radiata and left thalamus.       Prior Functioning  Home Living Family/patient expects to be discharged to:: Skilled nursing facility Living Arrangements: Spouse/significant other Available Help at Discharge: Skilled Nursing Facility (Wife unable to provide level of care needed) Type of Home: House Home Access: Stairs to enter ITT IndustriesEntrance  Stairs-Number of Steps: 4 Entrance Stairs-Rails:  Right;Left Home Layout: One level;Laundry or work area in [employer] Equipment: Environmental consultant - 2 wheels;Cane - single point;Shower seat;Toilet riser Prior Function Level of Independence: Independent Comments: Patient was driving pta. Communication Communication: Expressive difficulties (Nonsensical speech, difficulty finding words/naming objects) Dominant Hand: Right    Cognition  Cognition Arousal/Alertness: Awake/alert Behavior During Therapy: Impulsive Overall Cognitive Status: Impaired/Different from baseline (Per son-in-law, patient is different than baseline) Area of Impairment: Orientation;Attention;Memory;Safety/judgement;Awareness;Problem solving Orientation Level: Disoriented to;Time;Situation Current Attention Level: Sustained Memory: Decreased short-term memory Safety/Judgement: Decreased awareness of deficits;Decreased awareness of safety Problem Solving: Slow processing;Difficulty sequencing;Requires verbal cues (Gave patient room number.  Patient unable to locate room.) General Comments: Difficult to assess due to communication difficulties    Extremity/Trunk Assessment Upper Extremity Assessment Upper Extremity Assessment: Overall WFL for tasks assessed Lower Extremity Assessment Lower Extremity Assessment: Overall WFL for tasks assessed (Strength appears symmetrical) Cervical / Trunk Assessment Cervical / Trunk Assessment: Normal   Balance    End of Session PT - End of Session Equipment Utilized During Treatment: Gait belt Activity Tolerance: Patient tolerated treatment well Patient left: in chair;with call bell/phone within reach;with family/visitor present (No chair alarm available. Nsg OK with family in room.) Nurse Communication: Mobility status (No chair alarm.  Family to call RN if need to leave room)  GP     Vena Austria 07/24/2013, 11:06 AM Durenda Hurt. Renaldo Fiddler, [facility] Acute Rehab Services Pager  [phone]

## 2013-07-24 NOTE — Progress Notes (Signed)
VASCULAR LAB PRELIMINARY  PRELIMINARY  PRELIMINARY  PRELIMINARY  Carotid Dopplers completed.    Preliminary report:  There is severe calcific plaque at the origin of the ICA, bilaterally, with acoustic shadowing.  1-39% right ICA stenosis.  40-59% left ICA stenosis, however, I cannot insonate flow past the origin as either flow is shadowed out by severe plaque vs. occlusion or near occlusion.    Efrata Brunner, RVT 07/24/2013, 11:07 AM

## 2013-07-24 NOTE — Progress Notes (Signed)
Utilization review completed.  

## 2013-07-24 NOTE — Progress Notes (Signed)
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 -2-D echo still pending at this moment -Following neurology recommendations will continue aspirin for secondary prevention (patient was not compliant prior to admission which will not quantify as aspirin failure) -Continue risk factors modifications (blood pressure control and cholesterol medications) -Follow recommendations from physical therapy/occupational therapy/speech therapy for rehabilitation  2-hypertension: Overall stable with permissive hypertension in the setting of acute ischemic process.  -continue amlodipine -And when necessary labetalol  3-diabetes mellitus2: Continue a sliding scale insulin. A1c 6.9 -Continue Linagliptin  4-alcohol abuse: At this moment unable to provide counseling due to patient confusion. -Will monitor on CIWA protocol -started on thiamine -will check B12 level  5-BPH: Continue Flomax  6-dementia: Continue supportive care.  DVT: Lovenox  Code Status: Full Family Communication: Daughter at bedside Disposition Plan: To be determined   Consultants:  Neurology (stroke team)  Procedures:  See below for x-ray reports  2-D echo (pending)  Antibiotics:  None  HPI/Subjective: Afebrile, confused and with positive confabulation, no acute distress.  Objective: Filed Vitals:   07/24/13 1445  BP: 132/76  Pulse: 60  Temp: 97.8 F (36.6 C)  Resp: 18    Intake/Output Summary (Last 24 hours) at 07/24/13 1718 Last data filed at 07/23/13 2200  Gross per 24 hour  Intake    480 ml  Output      0 ml  Net    480 ml   Filed Weights   07/23/13 0814 07/23/13 2000  Weight: 72.576 kg (160 lb) 74.934 kg (165 lb 3.2 oz)    Exam:   General:  Pleasantly confused,  afebrile, able to follow simple commands. No signs of agitation.  Cardiovascular: S1 and S2, positive systolic ejection murmur, no rubs or gallops  Respiratory: Clear to auscultation bilaterally  Abdomen: Soft, nontender, nondistended, positive bowel sounds  Musculoskeletal: No lower extremity swelling, no cyanosis or clubbing. Right leg with scar from previous knee replacement. Full range of motion.  Neurologic exam: Alert, awake and oriented x1 patient able to follow three-step commands. Positive confabulation and easily off topic during conversation. Cranial nerve grossly intact. Muscle strength 5 out of 5 bilaterally and symmetrically except for 4-5 right upper extremity grip. Slight difficulty performing finger to nose and heel to shin (positive dysmetria)  Data Reviewed: Basic Metabolic Panel:  Recent Labs Lab 07/23/13 0836  NA 140  K 4.7  CL 101  CO2 26  GLUCOSE 102*  BUN 18  CREATININE 1.12  CALCIUM 9.4   Liver Function Tests:  Recent Labs Lab 07/23/13 0836  AST 20  ALT 14  ALKPHOS 85  BILITOT 0.6  PROT 7.8  ALBUMIN 3.8   CBC:  Recent Labs Lab 07/23/13 0836 07/23/13 1521  WBC 5.4 4.8  HGB 15.0 15.1  HCT 43.5 43.1  MCV 88.4 87.6  PLT 134* 120*   Cardiac Enzymes:  Recent Labs Lab 07/23/13 0836  TROPONINI <0.30   CBG:  Recent Labs Lab 07/23/13 2139 07/24/13 0642 07/24/13 1211  GLUCAP 189* 163* 116*   Studies: Ct Angio Head W/cm &/or Wo Cm  07/24/2013   CLINICAL DATA:  Left-sided CVA.  EXAM: CT ANGIOGRAPHY HEAD AND NECK  TECHNIQUE: Multidetector CT imaging of the head and neck was performed using the standard protocol during bolus administration of intravenous contrast. Multiplanar CT image reconstructions  including MIPs were obtained to evaluate the vascular anatomy. Carotid stenosis measurements (when applicable) are obtained utilizing NASCET criteria, using the distal internal carotid diameter as the denominator.  CONTRAST:  50mL OMNIPAQUE  IOHEXOL 350 MG/ML SOLN  COMPARISON:  MRI brain 07/23/2013.  FINDINGS: CTA HEAD FINDINGS  Infarcts involving the left basal ganglia are again noted. Atrophy and white matter disease is stable. No hemorrhage or mass lesion is present. The ventricles are proportionate to the degree of atrophy.  The paranasal sinuses and mastoid air cells are clear. The osseous skull is intact.  The postcontrast images demonstrate no pathologic enhancement.  There is moderate calcific stenosis of the cavernous carotid artery just distal to the genu as well. A high-grade stenosis is present in the distal left cavernous and para ophthalmic ICA with narrowing of the supra clinoid segment.  The left A1 segment is dominant. The anterior communicating artery is patent. The carotid bifurcations are intact. The ACA and MCA bifurcations are unremarkable.  The vertebrobasilar junction is within normal limits. The basilar artery is normal. Prominent AICA vessels are seen bilaterally. Small P1 segments are evident. There are fetal type posterior cerebral arteries bilaterally.  The dural sinuses are patent.  The lung apices are clear.  Review of the MIP images confirms the above findings.  CTA NECK FINDINGS  A standard 3 vessel arch configuration is present. Node mild atherosclerotic calcifications are present at the origin of the great vessels without stenosis. The vertebral arteries originate from the subclavian arteries bilaterally without significant stenosis. The left vertebral artery is the dominant vessel. There are no significant stenoses within the cervical vertebral arteries.  The right common carotid artery is within normal limits. Dense atherosclerotic calcifications are present at the right carotid bifurcation without a significant stenosis. The right cervical ICA demonstrates mild distal tortuosity without significant stenosis.  The left common carotid artery demonstrates mild proximal tortuosity. Dense calcifications are present at  the left carotid bifurcation. There is no significant stenosis relative to the distal vessel. The proximal left ICA deviates medially.  Soft tissues of the neck are otherwise unremarkable. The thyroid is within normal limits. The vocal cords are midline. No significant cervical adenopathy is present.  Multilevel degenerative changes are noted cervical spine. Endplate osteophyte formation and uncovertebral spurring is most evident at C5-6 and C6-7.  Review of the MIP images confirms the above findings.  IMPRESSION: 1. Dense atherosclerotic calcifications at the carotid bifurcations bilaterally without definite stenosis on either side. 2. Tortuosity of the distal right ICA and medial deviation of the proximal left ICA. 3. Moderate spondylosis of the cervical spine at C5-6 and C6-7. 4. Moderate to high-grade calcific stenoses within the cavernous carotid arteries bilaterally. The left sided stenosis is more distal, at the ophthalmic segment. This may contribute to the patient's infarcts. 5. Expected evolution of the infarcts without hemorrhage.   Electronically Signed   By: Gennette Pac M.D.   On: 07/24/2013 14:36   Dg Chest 2 View  07/23/2013   CLINICAL DATA:  History of acute CVA, history of diabetes and tobacco use now with stroke symptoms.  EXAM: CHEST  2 VIEW  COMPARISON:  PA and lateral chest x-ray of Nov 16, 2012  FINDINGS: The lungs are adequately inflated. There is no focal infiltrate. There is no evidence of atelectasis or pleural effusion. The cardiopericardial silhouette is mildly enlarged. The pulmonary vascularity is not engorged. There is tortuosity of the descending thoracic aorta. The observed portions of the bony thorax exhibit  no acute abnormalities.  IMPRESSION: 1. There is no evidence of pneumonia nor atelectasis. 2. There is mild enlargement of the cardiac silhouette without pulmonary vascular congestion. 3. There is atherosclerotic tortuosity of the descending thoracic aorta which is stable.    Electronically Signed   By: David  [place]   On: 07/23/2013 17:19   Ct Head Wo Contrast (only If Suspected Head Trauma And/or Pt Is On Anticoagulant)  07/23/2013   CLINICAL DATA:  Mental status change with slurred speech and gait ease disturbance. , history of dementia  EXAM: CT HEAD WITHOUT CONTRAST  TECHNIQUE: Contiguous axial images were obtained from the base of the skull through the vertex without intravenous contrast.  COMPARISON:  None.  FINDINGS: There is mild diffuse cerebral and cerebellar atrophy with compensatory ventriculomegaly. There is no shift of the midline. There is no evidence of an acute intracranial hemorrhage nor of an evolving ischemic infarction. There are no abnormal intracranial calcifications. There is subtle decreased density in the deep white matter of both cerebral hemispheres consistent with chronic small vessel ischemic change.  At bone window settings the observed portions of the paranasal sinuses and mastoid air cells are clear with the exception of the left frontal sinus which is opacified. There is no evidence of an acute skull fracture nor of a cephalohematoma.  IMPRESSION: 1. There is no evidence of an acute intracranial hemorrhage nor of an evolving ischemic infarction. 2. There is mild diffuse cerebral and cerebellar atrophy with compensatory ventriculomegaly. These findings are not out of proportion to the patient's age. 3. There is opacification of the left frontal sinus which may reflect acute inflammation. I have no previous studies with which to compare.   Electronically Signed   By: David  [place]   On: 07/23/2013 09:29   Ct Angio Neck W/cm &/or Wo/cm  07/24/2013   CLINICAL DATA:  Left-sided CVA.  EXAM: CT ANGIOGRAPHY HEAD AND NECK  TECHNIQUE: Multidetector CT imaging of the head and neck was performed using the standard protocol during bolus administration of intravenous contrast. Multiplanar CT image reconstructions including MIPs were obtained to evaluate the  vascular anatomy. Carotid stenosis measurements (when applicable) are obtained utilizing NASCET criteria, using the distal internal carotid diameter as the denominator.  CONTRAST:  50mL OMNIPAQUE IOHEXOL 350 MG/ML SOLN  COMPARISON:  MRI brain 07/23/2013.  FINDINGS: CTA HEAD FINDINGS  Infarcts involving the left basal ganglia are again noted. Atrophy and white matter disease is stable. No hemorrhage or mass lesion is present. The ventricles are proportionate to the degree of atrophy.  The paranasal sinuses and mastoid air cells are clear. The osseous skull is intact.  The postcontrast images demonstrate no pathologic enhancement.  There is moderate calcific stenosis of the cavernous carotid artery just distal to the genu as well. A high-grade stenosis is present in the distal left cavernous and para ophthalmic ICA with narrowing of the supra clinoid segment.  The left A1 segment is dominant. The anterior communicating artery is patent. The carotid bifurcations are intact. The ACA and MCA bifurcations are unremarkable.  The vertebrobasilar junction is within normal limits. The basilar artery is normal. Prominent AICA vessels are seen bilaterally. Small P1 segments are evident. There are fetal type posterior cerebral arteries bilaterally.  The dural sinuses are patent.  The lung apices are clear.  Review of the MIP images confirms the above findings.  CTA NECK FINDINGS  A standard 3 vessel arch configuration is present. Node mild atherosclerotic calcifications are present at the origin  of the great vessels without stenosis. The vertebral arteries originate from the subclavian arteries bilaterally without significant stenosis. The left vertebral artery is the dominant vessel. There are no significant stenoses within the cervical vertebral arteries.  The right common carotid artery is within normal limits. Dense atherosclerotic calcifications are present at the right carotid bifurcation without a significant stenosis.  The right cervical ICA demonstrates mild distal tortuosity without significant stenosis.  The left common carotid artery demonstrates mild proximal tortuosity. Dense calcifications are present at the left carotid bifurcation. There is no significant stenosis relative to the distal vessel. The proximal left ICA deviates medially.  Soft tissues of the neck are otherwise unremarkable. The thyroid is within normal limits. The vocal cords are midline. No significant cervical adenopathy is present.  Multilevel degenerative changes are noted cervical spine. Endplate osteophyte formation and uncovertebral spurring is most evident at C5-6 and C6-7.  Review of the MIP images confirms the above findings.  IMPRESSION: 1. Dense atherosclerotic calcifications at the carotid bifurcations bilaterally without definite stenosis on either side. 2. Tortuosity of the distal right ICA and medial deviation of the proximal left ICA. 3. Moderate spondylosis of the cervical spine at C5-6 and C6-7. 4. Moderate to high-grade calcific stenoses within the cavernous carotid arteries bilaterally. The left sided stenosis is more distal, at the ophthalmic segment. This may contribute to the patient's infarcts. 5. Expected evolution of the infarcts without hemorrhage.   Electronically Signed   By: Gennette Pachris  Mattern M.D.   On: 07/24/2013 14:36   Mr Brain Wo Contrast  07/23/2013   CLINICAL DATA:  Altered mental status.  New confusion.  EXAM: MRI HEAD WITHOUT CONTRAST  TECHNIQUE: Multiplanar, multiecho pulse sequences of the brain and surrounding structures were obtained without intravenous contrast.  COMPARISON:  Head CT 07/23/2013  FINDINGS: There is a 6 mm focus of restricted diffusion involving the left caudate/corona radiata with an additional 1 cm focus of restricted diffusion involving the anterior left thalamus. There is no intracranial hemorrhage. Periventricular T2 hyperintensities are nonspecific but compatible with mild chronic small vessel  ischemic disease. There is moderate cerebral atrophy. There is no evidence of mass, midline shift, or extra-axial fluid collection. Major intracranial vascular flow voids are unremarkable. Orbits are normal. Left frontal sinus mucosal thickening is noted.  IMPRESSION: Small acute infarcts involving the left caudate/corona radiata and left thalamus. No hemorrhage.   Electronically Signed   By: Sebastian AcheAllen  Grady   On: 07/23/2013 14:06    Scheduled Meds: . amLODipine  10 mg Oral Daily  . aspirin  300 mg Rectal Daily   Or  . aspirin  325 mg Oral Daily  . atorvastatin  40 mg Oral q1800  . enoxaparin (LOVENOX) injection  40 mg Subcutaneous Q24H  . folic acid  1 mg Oral Daily  . insulin aspart  0-15 Units Subcutaneous TID WC  . linagliptin  5 mg Oral Daily  . multivitamin with minerals  1 tablet Oral Daily  . tamsulosin  0.4 mg Oral Daily  . thiamine  100 mg Oral Daily   Or  . thiamine  100 mg Intravenous Daily   Time spent: >30 minutes (more than 50% of this time dedicated to discuss with patient and family members at bedside plan of care, results and disposition)  Yanessa Hocevar  [facility] Pager [phone]. If 7PM-7AM, please contact night-coverage at [email or web address], password [email or web address] 07/24/2013, 5:18 PM  LOS: 1 day

## 2013-07-25 DIAGNOSIS — F0280 Dementia in other diseases classified elsewhere without behavioral disturbance: Secondary | ICD-10-CM

## 2013-07-25 DIAGNOSIS — G309 Alzheimer's disease, unspecified: Secondary | ICD-10-CM

## 2013-07-25 LAB — GLUCOSE, CAPILLARY
GLUCOSE-CAPILLARY: 168 mg/dL — AB (ref 70–99)
Glucose-Capillary: 145 mg/dL — ABNORMAL HIGH (ref 70–99)
Glucose-Capillary: 127 mg/dL — ABNORMAL HIGH (ref 70–99)

## 2013-07-25 LAB — BASIC METABOLIC PANEL
BUN: 19 mg/dL (ref 6–23)
CO2: 26 mEq/L (ref 19–32)
Calcium: 8.8 mg/dL (ref 8.4–10.5)
Chloride: 99 mEq/L (ref 96–112)
Creatinine, Ser: 1.13 mg/dL (ref 0.50–1.35)
GFR calc non Af Amer: 59 mL/min — ABNORMAL LOW (ref >90)
GFR, EST AFRICAN AMERICAN: 69 mL/min — AB (ref >90)
GLUCOSE: 118 mg/dL — AB (ref 70–99)
POTASSIUM: 4.3 meq/L (ref 3.7–5.3)
Sodium: 137 mEq/L (ref 137–147)

## 2013-07-25 LAB — VITAMIN B12: Vitamin B-12: 412 pg/mL (ref 211–911)

## 2013-07-25 MED ORDER — CLOPIDOGREL BISULFATE 75 MG PO TABS
75.0000 mg | ORAL_TABLET | Freq: Every day | ORAL | Status: DC
  Administered 2013-07-25 – 2013-07-27 (×3): 75 mg via ORAL
  Filled 2013-07-25 (×3): qty 1

## 2013-07-25 MED ORDER — ASPIRIN EC 81 MG PO TBEC
81.0000 mg | DELAYED_RELEASE_TABLET | Freq: Every day | ORAL | Status: DC
  Administered 2013-07-26 – 2013-07-27 (×2): 81 mg via ORAL
  Filled 2013-07-25 (×3): qty 1

## 2013-07-25 MED ORDER — MEMANTINE HCL 5 MG PO TABS
5.0000 mg | ORAL_TABLET | Freq: Every day | ORAL | Status: DC
  Administered 2013-07-25 – 2013-07-27 (×3): 5 mg via ORAL
  Filled 2013-07-25 (×3): qty 1

## 2013-07-25 MED ORDER — HYDRALAZINE HCL 20 MG/ML IJ SOLN: 10.0000 mg | Freq: Three times a day (TID) | INTRAMUSCULAR | Status: DC | PRN

## 2013-07-25 NOTE — Progress Notes (Signed)
Orders received for swallow evaluation however per RN, patient has passed the RN stroke swallow screen and is tolerating diet. Defer formal evaluation per protocol. Please re-consult as needed.  Thank you  Kharlie Bring MA, CCC-SLP [phone]   

## 2013-07-25 NOTE — Progress Notes (Signed)
Stroke Team Progress Note  HISTORY Christian Erickson is an 80 y.o. male with no previous history of strokes who was seen in the emergency department, 07/23/2013, after his wife found him on the floor at home this morning with right sided weakness, dysarthria, and inability to follow commands. The patient has a history of early dementia which has been present for approximately one year. The patient's wife reports that his symptoms have been getting worse over the past month with increased forgetfulness and irritability. He had knee surgery in March of 2014. Since that time he has been getting his days and nights reversed. He typically stays up all night and sleeps all day. He also has a history of alcohol use and has been drinking a fair amount of wine during the night.  On the morning of admission, at approximately 5 AM, the patient's wife was awakened by a loud noise. She got out of bed and found her husband on the floor unable to get up and unable to follow commands. He had right-sided weakness and dysarthria. EMS was summoned and he was brought to the [facility] emergency department this morning. An MRI revealed small acute infarcts involving the left caudate/corona radiata and left thalamus. No hemorrhage was noted. The patient is supposed to be taking aspirin 81 mg daily; however, his wife reports that he has been inconsistent with his medications in part due to to memory problems but also secondary to noncompliance. In the emergency room the patient's strength appeared to have improved significantly although he remained confused and had difficulty following commands. He was admitted for a full stroke workup.    Date last known well: Date: 07/22/2013  Time last known well: Unable to determine  tPA Given: No t-PA was given as the patient was outside of the treatment window and the exact time of onset of symptoms was unknown.   SUBJECTIVE The patient's wife and multiple family members are at the bedside today.  Dr. Pearlean Brownie had a long talk with them regarding the patient's condition and recommendations going forward.  OBJECTIVE Most recent Vital Signs: Filed Vitals:   07/24/13 1445 07/24/13 2022 07/25/13 0225 07/25/13 0627  BP: 132/76 123/58 175/79 141/64  Pulse: 60 72 73 59  Temp: 97.8 F (36.6 C) 98 F (36.7 C) 98 F (36.7 C) 98.3 F (36.8 C)  TempSrc: Oral Oral Oral Oral  Resp: 18 18 18 16   Height:      Weight:      SpO2: 97% 99% 99% 99%   CBG (last 3)   Recent Labs  07/24/13 1656 07/24/13 2117 07/25/13 0629  GLUCAP 185* 121* 168*    IV Fluid Intake:     MEDICATIONS  . amLODipine  10 mg Oral Daily  . aspirin  300 mg Rectal Daily   Or  . aspirin  325 mg Oral Daily  . atorvastatin  40 mg Oral q1800  . enoxaparin (LOVENOX) injection  40 mg Subcutaneous Q24H  . folic acid  1 mg Oral Daily  . insulin aspart  0-15 Units Subcutaneous TID WC  . linagliptin  5 mg Oral Daily  . multivitamin with minerals  1 tablet Oral Daily  . tamsulosin  0.4 mg Oral Daily  . thiamine  100 mg Oral Daily   Or  . thiamine  100 mg Intravenous Daily   PRN:  fluticasone, labetalol, LORazepam, LORazepam, senna-docusate  Diet:  Carb Control thin liquids Activity:  Up with assistance DVT Prophylaxis:  Lovenox  CLINICALLY SIGNIFICANT  STUDIES Basic Metabolic Panel:   Recent Labs Lab 07/23/13 0836 07/25/13 0346  NA 140 137  K 4.7 4.3  CL 101 99  CO2 26 26  GLUCOSE 102* 118*  BUN 18 19  CREATININE 1.12 1.13  CALCIUM 9.4 8.8   Liver Function Tests:   Recent Labs Lab 07/23/13 0836  AST 20  ALT 14  ALKPHOS 85  BILITOT 0.6  PROT 7.8  ALBUMIN 3.8   CBC:   Recent Labs Lab 07/23/13 0836 07/23/13 1521  WBC 5.4 4.8  HGB 15.0 15.1  HCT 43.5 43.1  MCV 88.4 87.6  PLT 134* 120*   Coagulation:   Recent Labs Lab 07/23/13 0836  LABPROT 12.8  INR 0.98   Cardiac Enzymes:   Recent Labs Lab 07/23/13 0836  TROPONINI <0.30   Urinalysis:   Recent Labs Lab 07/23/13 0926   COLORURINE YELLOW  LABSPEC 1.013  PHURINE 8.0  GLUCOSEU NEGATIVE  HGBUR NEGATIVE  BILIRUBINUR NEGATIVE  KETONESUR NEGATIVE  PROTEINUR NEGATIVE  UROBILINOGEN 0.2  NITRITE NEGATIVE  LEUKOCYTESUR NEGATIVE   Lipid Panel    Component Value Date/Time   CHOL 164 07/24/2013 0715   TRIG 55 07/24/2013 0715   HDL 55 07/24/2013 0715   CHOLHDL 3.0 07/24/2013 0715   VLDL 11 07/24/2013 0715   LDLCALC 98 07/24/2013 0715   HgbA1C  Lab Results  Component Value Date   HGBA1C 6.8* 07/24/2013    Urine Drug Screen:   No results found for this basename: labopia,  cocainscrnur,  labbenz,  amphetmu,  thcu,  labbarb    Alcohol Level: No results found for this basename: ETH,  in the last 168 hours  Dg Chest 2 View 07/23/2013    1. There is no evidence of pneumonia nor atelectasis. 2. There is mild enlargement of the cardiac silhouette without pulmonary vascular congestion. 3. There is atherosclerotic tortuosity of the descending thoracic aorta which is stable.      Ct Head Wo Contrast (only If Suspected Head Trauma And/or Pt Is On Anticoagulant) 07/23/2013    1. There is no evidence of an acute intracranial hemorrhage nor of an evolving ischemic infarction. 2. There is mild diffuse cerebral and cerebellar atrophy with compensatory ventriculomegaly. These findings are not out of proportion to the patient's age. 3. There is opacification of the left frontal sinus which may reflect acute inflammation. I have no previous studies with which to compare.      Mr Brain Wo Contrast 07/23/2013    Small acute infarcts involving the left caudate/corona radiata and left thalamus. No hemorrhage.      CT angiogram of the head and neck 07/24/2013 1. Dense atherosclerotic calcifications at the carotid bifurcations  bilaterally without definite stenosis on either side.  2. Tortuosity of the distal right ICA and medial deviation of the  proximal left ICA.  3. Moderate spondylosis of the cervical spine at C5-6 and  C6-7.  4. Moderate to high-grade calcific stenoses within the cavernous  carotid arteries bilaterally. The left sided stenosis is more  distal, at the ophthalmic segment. This may contribute to the  patient's infarcts.  5. Expected evolution of the infarcts without hemorrhage.   2D Echocardiogram  Ejection fraction 55-60%. No cardiac source of emboli identified.  Carotid Doppler  Carotid Dopplers completed.  Preliminary report: There is severe calcific plaque at the origin of the ICA, bilaterally, with acoustic shadowing. 1-39% right ICA stenosis. 40-59% left ICA stenosis, however, I cannot insonate flow past the origin as either flow  is shadowed out by severe plaque vs. occlusion or near occlusion.    EKG  - sinus rhythm rate 81 beats per minute  Therapy Recommendations skilled nursing facility recommended  Physical Exam   Neurologic Examination:   Mental Status:  Alert. Oriented x1. Able to follow 3 step commands. Speech fluent at times but at other times non-fluent. Conversation often not appropriate to questions being asked. +Confabulatory. Could perform simple calculations today which he could not do yesterday. Cranial Nerves:  II: Discs not visualized; Visual fields grossly normal, pupils equal, round, reactive to light and accommodation  III,IV, VI: ptosis not present, extra-ocular motions intact bilaterally  V,VII: smile symmetric, facial light touch sensation normal bilaterally  VIII: hearing normal bilaterally  IX,X: gag reflex present  XI: bilateral shoulder shrug  XII: midline tongue extension  Motor:  Right : Upper extremity 5/5 Left: Upper extremity 5/5 ( Except grip strength 4/5 RUE )  Lower extremity 5/5 Lower extremity 5/5  Tone and bulk:normal tone throughout; no atrophy noted  Sensory: Pinprick and light touch intact throughout, bilaterally  Deep Tendon Reflexes: 1+ and symmetric throughout  Plantars:  Right: downgoing Left: downgoing  Cerebellar:  Finger  to nose performed with mild tremor bilaterally. Heel-to-shin performed with mild difficulty. Gait: Deferred  CV: Distal pulses weak to absent   ASSESSMENT Mr. Christian Erickson is a 80 y.o. male presenting with right hemiparesis, dysarthria, and confusion. TPA was not given secondary to late presentation.[name].an MRI confirmed small acute infarcts involving the left caudate/corona radiata and left thalamus. Infarcts felt to be thrombotic secondary to small vessel disease. On aspirin 81 mg orally every day prior to admission. Now on aspirin 325 mg orally every day for secondary stroke prevention. Patient with resultant confusion and speech deficits. Work up underway.   Hypertension history  Hyperlipidemia - cholesterol 166 LDL 108 - Lipitor prior to admission  Alcohol abuse  Early dementia  Diabetes mellitus - hemoglobin A1c 6.9  Thrombocytopenia  History of noncompliance  CT angiogram - Moderate to high-grade calcific stenoses within the cavernous carotid arteries bilaterally.  Hospital day # 2  TREATMENT/PLAN  Change to aspirin 81 mg and Plavix x3 months - plan Plavix alone.  Therapy evaluations - skilled nursing facility placement recommended  Risk factor modifications    Increase Lipitor to 40 mg daily - LDL goal less than 70  followup Dr. Pearlean BrownieSethi in one to 2 months.  Delton Seeavid Rinehuls PA-C [facility] Pager [phone] 07/25/2013, 8:08 AM  I have personally obtained a history, examined the patient, evaluated imaging results, and formulated the assessment and plan of care. I agree with the above.  Delia HeadyPramod Airen Stiehl, MD

## 2013-07-25 NOTE — Progress Notes (Deleted)
Pt. DC'd home via car with daughter.  DC instructions and prescriptions given to patient.  Vital signs and assessments were stable.

## 2013-07-25 NOTE — Progress Notes (Signed)
[facility] PROGRESS NOTE  DANH BAYUS ZOX:[id number] DOB: [birth year] DOA: 07/23/2013 PCP: Willow Ora, MD  Assessment/Plan: 1-acute encephalopathy secondary to CVA: Patient with acute small infarcts involving the left palate date/Cordarone a cardiac and left thalamus. -2-D echo (no source of emboli, preserved EF 55-60%; grade 1 diastolic dysfunction -Following neurology recommendations and given the affection of large vessels in the area of stroke, will treat with ASA and plavix for 3 months and and subsequent plavix alone for secondary prevention. -Continue risk factors modifications (blood pressure control and cholesterol medications) -Follow recommendations from physical therapy/occupational therapy/speech therapy for rehabilitation. Needs SNF  2-hypertension: Overall stable with permissive hypertension in the setting of acute ischemic process.  -continue amlodipine -And when necessary hydralazine  3-diabetes mellitus2: Continue a sliding scale insulin. A1c 6.9 -Continue Linagliptin  4-alcohol abuse: At this moment unable to provide counseling due to patient confusion. -Will continue monitoring on CIWA protocol -started on thiamine -B12 level WNL  5-BPH: Continue Flomax  6-dementia: Continue supportive care. -will start low dose namenda  DVT: Lovenox  Code Status: Full Family Communication: Daughter at bedside Disposition Plan: SNF   Consultants:  Neurology (stroke team)  Procedures:  See below for x-ray reports  2-D echo (pending)  Antibiotics:  None  HPI/Subjective: Afebrile, confused and with positive confabulation, no acute distress. Motor strength practically back to normal.  Objective: Filed Vitals:   07/25/13 1418  BP: 120/50  Pulse: 62  Temp: 98 F (36.7 C)  Resp: 18    Intake/Output Summary (Last 24 hours) at 07/25/13 1543 Last data filed at 07/25/13 1100  Gross per 24 hour  Intake    300 ml  Output   2200 ml  Net  -1900 ml    Filed Weights   07/23/13 0814 07/23/13 2000  Weight: 72.576 kg (160 lb) 74.934 kg (165 lb 3.2 oz)    Exam:   General:  Pleasantly confused, afebrile, able to follow simple commands. No signs of agitation.  Cardiovascular: S1 and S2, positive systolic ejection murmur, no rubs or gallops  Respiratory: Clear to auscultation bilaterally  Abdomen: Soft, nontender, nondistended, positive bowel sounds  Musculoskeletal: No lower extremity swelling, no cyanosis or clubbing. Right leg with scar from previous knee replacement. Full range of motion.  Neurologic exam: Alert, awake and oriented x1 patient able to follow three-step commands. Positive confabulation and easily off topic during conversation. Cranial nerve grossly intact. Muscle strength 5 out of 5 bilaterally and symmetrically except for 4-5 right upper extremity grip. Slight difficulty performing finger to nose and heel to shin (positive dysmetria)  Data Reviewed: Basic Metabolic Panel:  Recent Labs Lab 07/23/13 0836 07/25/13 0346  NA 140 137  K 4.7 4.3  CL 101 99  CO2 26 26  GLUCOSE 102* 118*  BUN 18 19  CREATININE 1.12 1.13  CALCIUM 9.4 8.8   Liver Function Tests:  Recent Labs Lab 07/23/13 0836  AST 20  ALT 14  ALKPHOS 85  BILITOT 0.6  PROT 7.8  ALBUMIN 3.8   CBC:  Recent Labs Lab 07/23/13 0836 07/23/13 1521  WBC 5.4 4.8  HGB 15.0 15.1  HCT 43.5 43.1  MCV 88.4 87.6  PLT 134* 120*   Cardiac Enzymes:  Recent Labs Lab 07/23/13 0836  TROPONINI <0.30   CBG:  Recent Labs Lab 07/24/13 1211 07/24/13 1656 07/24/13 2117 07/25/13 0629 07/25/13 1154  GLUCAP 116* 185* 121* 168* 145*   Studies: Ct Angio Head W/cm &/or Wo Cm  07/24/2013   CLINICAL  DATA:  Left-sided CVA.  EXAM: CT ANGIOGRAPHY HEAD AND NECK  TECHNIQUE: Multidetector CT imaging of the head and neck was performed using the standard protocol during bolus administration of intravenous contrast. Multiplanar CT image reconstructions  including MIPs were obtained to evaluate the vascular anatomy. Carotid stenosis measurements (when applicable) are obtained utilizing NASCET criteria, using the distal internal carotid diameter as the denominator.  CONTRAST:  50mL OMNIPAQUE IOHEXOL 350 MG/ML SOLN  COMPARISON:  MRI brain 07/23/2013.  FINDINGS: CTA HEAD FINDINGS  Infarcts involving the left basal ganglia are again noted. Atrophy and white matter disease is stable. No hemorrhage or mass lesion is present. The ventricles are proportionate to the degree of atrophy.  The paranasal sinuses and mastoid air cells are clear. The osseous skull is intact.  The postcontrast images demonstrate no pathologic enhancement.  There is moderate calcific stenosis of the cavernous carotid artery just distal to the genu as well. A high-grade stenosis is present in the distal left cavernous and para ophthalmic ICA with narrowing of the supra clinoid segment.  The left A1 segment is dominant. The anterior communicating artery is patent. The carotid bifurcations are intact. The ACA and MCA bifurcations are unremarkable.  The vertebrobasilar junction is within normal limits. The basilar artery is normal. Prominent AICA vessels are seen bilaterally. Small P1 segments are evident. There are fetal type posterior cerebral arteries bilaterally.  The dural sinuses are patent.  The lung apices are clear.  Review of the MIP images confirms the above findings.  CTA NECK FINDINGS  A standard 3 vessel arch configuration is present. Node mild atherosclerotic calcifications are present at the origin of the great vessels without stenosis. The vertebral arteries originate from the subclavian arteries bilaterally without significant stenosis. The left vertebral artery is the dominant vessel. There are no significant stenoses within the cervical vertebral arteries.  The right common carotid artery is within normal limits. Dense atherosclerotic calcifications are present at the right carotid  bifurcation without a significant stenosis. The right cervical ICA demonstrates mild distal tortuosity without significant stenosis.  The left common carotid artery demonstrates mild proximal tortuosity. Dense calcifications are present at the left carotid bifurcation. There is no significant stenosis relative to the distal vessel. The proximal left ICA deviates medially.  Soft tissues of the neck are otherwise unremarkable. The thyroid is within normal limits. The vocal cords are midline. No significant cervical adenopathy is present.  Multilevel degenerative changes are noted cervical spine. Endplate osteophyte formation and uncovertebral spurring is most evident at C5-6 and C6-7.  Review of the MIP images confirms the above findings.  IMPRESSION: 1. Dense atherosclerotic calcifications at the carotid bifurcations bilaterally without definite stenosis on either side. 2. Tortuosity of the distal right ICA and medial deviation of the proximal left ICA. 3. Moderate spondylosis of the cervical spine at C5-6 and C6-7. 4. Moderate to high-grade calcific stenoses within the cavernous carotid arteries bilaterally. The left sided stenosis is more distal, at the ophthalmic segment. This may contribute to the patient's infarcts. 5. Expected evolution of the infarcts without hemorrhage.   Electronically Signed   By: Gennette Pac M.D.   On: 07/24/2013 14:36   Dg Chest 2 View  07/23/2013   CLINICAL DATA:  History of acute CVA, history of diabetes and tobacco use now with stroke symptoms.  EXAM: CHEST  2 VIEW  COMPARISON:  PA and lateral chest x-ray of Nov 16, 2012  FINDINGS: The lungs are adequately inflated. There is no focal infiltrate.  There is no evidence of atelectasis or pleural effusion. The cardiopericardial silhouette is mildly enlarged. The pulmonary vascularity is not engorged. There is tortuosity of the descending thoracic aorta. The observed portions of the bony thorax exhibit no acute abnormalities.   IMPRESSION: 1. There is no evidence of pneumonia nor atelectasis. 2. There is mild enlargement of the cardiac silhouette without pulmonary vascular congestion. 3. There is atherosclerotic tortuosity of the descending thoracic aorta which is stable.   Electronically Signed   By: David  [place]   On: 07/23/2013 17:19   Ct Angio Neck W/cm &/or Wo/cm  07/24/2013   CLINICAL DATA:  Left-sided CVA.  EXAM: CT ANGIOGRAPHY HEAD AND NECK  TECHNIQUE: Multidetector CT imaging of the head and neck was performed using the standard protocol during bolus administration of intravenous contrast. Multiplanar CT image reconstructions including MIPs were obtained to evaluate the vascular anatomy. Carotid stenosis measurements (when applicable) are obtained utilizing NASCET criteria, using the distal internal carotid diameter as the denominator.  CONTRAST:  50mL OMNIPAQUE IOHEXOL 350 MG/ML SOLN  COMPARISON:  MRI brain 07/23/2013.  FINDINGS: CTA HEAD FINDINGS  Infarcts involving the left basal ganglia are again noted. Atrophy and white matter disease is stable. No hemorrhage or mass lesion is present. The ventricles are proportionate to the degree of atrophy.  The paranasal sinuses and mastoid air cells are clear. The osseous skull is intact.  The postcontrast images demonstrate no pathologic enhancement.  There is moderate calcific stenosis of the cavernous carotid artery just distal to the genu as well. A high-grade stenosis is present in the distal left cavernous and para ophthalmic ICA with narrowing of the supra clinoid segment.  The left A1 segment is dominant. The anterior communicating artery is patent. The carotid bifurcations are intact. The ACA and MCA bifurcations are unremarkable.  The vertebrobasilar junction is within normal limits. The basilar artery is normal. Prominent AICA vessels are seen bilaterally. Small P1 segments are evident. There are fetal type posterior cerebral arteries bilaterally.  The dural sinuses are  patent.  The lung apices are clear.  Review of the MIP images confirms the above findings.  CTA NECK FINDINGS  A standard 3 vessel arch configuration is present. Node mild atherosclerotic calcifications are present at the origin of the great vessels without stenosis. The vertebral arteries originate from the subclavian arteries bilaterally without significant stenosis. The left vertebral artery is the dominant vessel. There are no significant stenoses within the cervical vertebral arteries.  The right common carotid artery is within normal limits. Dense atherosclerotic calcifications are present at the right carotid bifurcation without a significant stenosis. The right cervical ICA demonstrates mild distal tortuosity without significant stenosis.  The left common carotid artery demonstrates mild proximal tortuosity. Dense calcifications are present at the left carotid bifurcation. There is no significant stenosis relative to the distal vessel. The proximal left ICA deviates medially.  Soft tissues of the neck are otherwise unremarkable. The thyroid is within normal limits. The vocal cords are midline. No significant cervical adenopathy is present.  Multilevel degenerative changes are noted cervical spine. Endplate osteophyte formation and uncovertebral spurring is most evident at C5-6 and C6-7.  Review of the MIP images confirms the above findings.  IMPRESSION: 1. Dense atherosclerotic calcifications at the carotid bifurcations bilaterally without definite stenosis on either side. 2. Tortuosity of the distal right ICA and medial deviation of the proximal left ICA. 3. Moderate spondylosis of the cervical spine at C5-6 and C6-7. 4. Moderate to high-grade calcific stenoses  within the cavernous carotid arteries bilaterally. The left sided stenosis is more distal, at the ophthalmic segment. This may contribute to the patient's infarcts. 5. Expected evolution of the infarcts without hemorrhage.   Electronically Signed    By: Gennette Pachris  Mattern M.D.   On: 07/24/2013 14:36    Scheduled Meds: . amLODipine  10 mg Oral Daily  . aspirin EC  81 mg Oral Daily  . atorvastatin  40 mg Oral q1800  . clopidogrel  75 mg Oral Q breakfast  . enoxaparin (LOVENOX) injection  40 mg Subcutaneous Q24H  . folic acid  1 mg Oral Daily  . insulin aspart  0-15 Units Subcutaneous TID WC  . linagliptin  5 mg Oral Daily  . multivitamin with minerals  1 tablet Oral Daily  . tamsulosin  0.4 mg Oral Daily  . thiamine  100 mg Oral Daily   Time spent: >30 minutes (more than 50% of this time dedicated to discuss with patient and family members at bedside plan of care, results and disposition)  Jiyan Walkowski  [facility] Pager [phone]. If 7PM-7AM, please contact night-coverage at [email or web address], password [email or web address] 07/25/2013, 3:43 PM  LOS: 2 days

## 2013-07-26 ENCOUNTER — Other Ambulatory Visit: Payer: Self-pay | Admitting: *Deleted

## 2013-07-26 LAB — GLUCOSE, CAPILLARY
Glucose-Capillary: 180 mg/dL — ABNORMAL HIGH (ref 70–99)
Glucose-Capillary: 122 mg/dL — ABNORMAL HIGH (ref 70–99)
Glucose-Capillary: 125 mg/dL — ABNORMAL HIGH (ref 70–99)
Glucose-Capillary: 114 mg/dL — ABNORMAL HIGH (ref 70–99)
Glucose-Capillary: 137 mg/dL — ABNORMAL HIGH (ref 70–99)

## 2013-07-26 MED ORDER — AMLODIPINE BESYLATE 5 MG PO TABS: 5.0000 mg | ORAL_TABLET | Freq: Every day | ORAL | Status: DC

## 2013-07-26 NOTE — Progress Notes (Signed)
Physical Therapy Treatment Patient Details Name: Christian Erickson MRN: [id number] DOB: 05-19-1931 Today's Date: 07/26/2013 Time: 1340-1405 PT Time Calculation (min): 25 min  PT Assessment / Plan / Recommendation  History of Present Illness Christian Erickson is an 80 y.o. male with no previous history of strokes who was seen in the emergency department, 07/23/2013, after his wife found him on the floor at home this morning with right sided weakness, dysarthria, and inability to follow commands. The patient has a history of early dementia which has been present for approximately one year.  MRI showed Small acute infarcts involving the left caudate/corona radiata and left thalamus.   PT Comments   Patient largest barrier to mobility is his cognition and decreased awareness. Patient educated on not getting up without assistance. Family looking at SNFs today.   Follow Up Recommendations  SNF     Does the patient have the potential to tolerate intense rehabilitation     Barriers to Discharge        Equipment Recommendations  None recommended by PT    Recommendations for Other Services    Frequency Min 2X/week   Progress towards PT Goals Progress towards PT goals: Progressing toward goals  Plan Current plan remains appropriate    Precautions / Restrictions Precautions Precautions: Fall Precaution Comments: Decreased cognition/safety   Pertinent Vitals/Pain no apparent distress     Mobility  Bed Mobility Overal bed mobility: Needs Assistance Bed Mobility: Supine to Sit Supine to sit: Supervision General bed mobility comments: supervision for safety. Transfers Overall transfer level: Needs assistance Equipment used: None Sit to Stand: Min assist General transfer comment: A for balance and stability with stand. Cues for positioning prior to sitting Ambulation/Gait Ambulation/Gait assistance: Min assist Ambulation Distance (Feet): 200 Feet Assistive device: 1 person hand held  assist Gait Pattern/deviations: Step-through pattern;Decreased stride length;Narrow base of support Gait velocity interpretation: at or above normal speed for age/gender General Gait Details: Patient required HHA this session. Without HHA noted increased instability and patient reaching for outside support    Exercises     PT Diagnosis:    PT Problem List:   PT Treatment Interventions:     PT Goals (current goals can now be found in the care plan section)    Visit Information  Last PT Received On: 07/26/13 Assistance Needed: +1 History of Present Illness: Christian Erickson is an 80 y.o. male with no previous history of strokes who was seen in the emergency department, 07/23/2013, after his wife found him on the floor at home this morning with right sided weakness, dysarthria, and inability to follow commands. The patient has a history of early dementia which has been present for approximately one year.  MRI showed Small acute infarcts involving the left caudate/corona radiata and left thalamus.    Subjective Data      Cognition  Cognition Arousal/Alertness: Awake/alert Behavior During Therapy: WFL for tasks assessed/performed Overall Cognitive Status: Impaired/Different from baseline Area of Impairment: Orientation;Attention;Problem solving;Memory;Safety/judgement Orientation Level: Disoriented to;Place;Situation;Time Current Attention Level: Sustained Memory: Decreased short-term memory Safety/Judgement: Decreased awareness of deficits;Decreased awareness of safety Problem Solving: Slow processing;Difficulty sequencing;Requires verbal cues General Comments: Patient stated that hes not sure where he is and that hes knee replacement was about a week ago    Balance  Balance Overall balance assessment: Needs assistance Sitting balance-Leahy Scale: Good Standing balance-Leahy Scale: Fair  End of Session PT - End of Session Equipment Utilized During Treatment: Gait belt Activity  Tolerance: Patient tolerated treatment  well Patient left: in chair;with call bell/phone within reach;with chair alarm set Nurse Communication: Mobility status   GP     Fredrich BirksRobinette, Aren Cherne Elizabeth 07/26/2013, 2:59 PM 07/26/2013 Fredrich Birksobinette, Yazmeen Woolf Elizabeth PTA [phone] pager [phone] office

## 2013-07-26 NOTE — Evaluation (Signed)
Speech Language Pathology Evaluation Patient Details Name: Christian Erickson MRN: [id number] DOB: 1930/07/23 Today's Date: 07/26/2013 Time: 1453-1540 SLP Time Calculation (min): 47 min  Problem List:  Patient Active Problem List   Diagnosis Date Noted  . Stroke 07/23/2013  . Acute CVA (cerebrovascular accident) 07/23/2013  . Loss of weight 12/11/2012  . Memory difficulty 05/26/2012  . Abnormal TSH 01/20/2012  . Medicare annual wellness visit, subsequent 01/06/2012  . Edema 01/06/2012  . BPH (benign prostatic hyperplasia) 09/05/2011  . DJD (degenerative joint disease) 06/24/2011  . ALLERGIC RHINITIS 09/10/2010  . BACK PAIN 08/21/2009  . DM 05/25/2009  . THROMBOCYTOPENIA 08/30/2008  . GLAUCOMA 08/30/2008  . ERECTILE DYSFUNCTION 01/25/2007  . HYPERLIPIDEMIA 12/15/2006  . HYPERTENSION 12/15/2006  . DIVERTICULITIS, HX OF 12/15/2006   Past Medical History:  Past Medical History  Diagnosis Date  . BPH (benign prostatic hypertrophy)   . Diabetes mellitus   . Hypertension   . Hyperlipidemia   . Diverticulitis   . ED (erectile dysfunction)   . Glaucoma     sees eye doctor routinely  . Allergic rhinitis   . Subclinical hypothyroidism   . Arthritis   . Blood dyscrasia    Past Surgical History:  Past Surgical History  Procedure Laterality Date  . Inguinal hernia repair    . Colectomy      w/ reversal due to divertiuclitis per patient in 1990s aprox  . Sbo repair    . Total knee arthroplasty Right 10/27/2012    Procedure: TOTAL KNEE ARTHROPLASTY;  Surgeon: Loreta Ave, MD;  Location: [facility] OR;  Service: Orthopedics;  Laterality: Right;   HPI:  Christian Erickson is an 80 y.o. male with no previous history of strokes who was seen in the emergency department, 07/23/2013, after his wife found him on the floor at home this morning with right sided weakness, dysarthria, and inability to follow commands. The patient has a history of early dementia which has been present for approximately  one year. The patient's wife reports that his symptoms have been getting worse over the past month with increased forgetfulness and irritability. He had knee surgery in March of 2014. Since that time he has been getting his days and nights reversed. He typically stays up all night and sleeps all day. He also has a history of alcohol use and has been drinking a fair amount of wine during the night.     Assessment / Plan / Recommendation Clinical Impression  Pt. exhibits relatively intact receptive language skills, but moderate expressive aphasia, characterized by fluent speech, but decreased word retrival, with circumlocution and perseveration prevalent.  Pt. attempts to cover deficits in conversation, by explaination/rationalizing his meaning or intent.  Pt. would be an excellent Rehab candidate, and has a very supportive wife and daughter.    SLP Assessment  Patient needs continued Speech Lanaguage Pathology Services    Follow Up Recommendations  Inpatient Rehab    Frequency and Duration min 2x/week  2 weeks   Pertinent Vitals/Pain n/a   SLP Goals  SLP Goals Potential to Achieve Goals: Fair Potential Considerations: Ability to learn/carryover information Progress/Goals/Alternative treatment plan discussed with pt/caregiver and they: Agree  SLP Evaluation Prior Functioning  Cognitive/Linguistic Baseline: Baseline deficits Baseline deficit details: "Repeats himself." Pt. was able to drive and perform basic ADLS. 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[employer])   Cognition  Overall Cognitive Status: Impaired/Different from baseline Arousal/Alertness: Awake/alert  Orientation Level:  (Difficult to assess due to aphasia) Memory: Impaired Memory Impairment: Decreased short term memory Decreased Short Term Memory: Verbal basic;Functional basic Awareness: Impaired Awareness Impairment: Anticipatory  impairment;Intellectual impairment;Emergent impairment Problem Solving: Impaired Problem Solving Impairment: Functional basic;Verbal basic Behaviors: Perseveration;Confabulation (Circulocution) Safety/Judgment: Impaired    Comprehension  Auditory Comprehension Overall Auditory Comprehension: Appears within functional limits for tasks assessed Yes/No Questions: Within Functional Limits Commands: Within Functional Limits Conversation: Simple EffectiveTechniques: Extra processing time;Repetition Reading Comprehension Reading Status: Within funtional limits    Expression Expression Primary Mode of Expression: Verbal Verbal Expression Overall Verbal Expression: Impaired Initiation: No impairment Level of Generative/Spontaneous Verbalization: Conversation Repetition: No impairment Naming: Impairment Confrontation: Impaired Convergent: 50-74% accurate Verbal Errors: Confabulation;Perseveration;Language of confusion (Circulocution) Pragmatics: No impairment Effective Techniques: Semantic cues;Phonemic cues;Written cues Non-Verbal Means of Communication: Not applicable Written Expression Dominant Hand: Right Written Expression: Not tested   Oral / Motor Oral Motor/Sensory Function Overall Oral Motor/Sensory Function: Appears within functional limits for tasks assessed Motor Speech Overall Motor Speech: Appears within functional limits for tasks assessed Respiration: Within functional limits Phonation: Normal Resonance: Within functional limits Articulation: Within functional limitis Intelligibility: Intelligible Motor Planning: Witnin functional limits Motor Speech Errors: Not applicable   GO     Maryjo RochesterWillis, Larayah Clute T 07/26/2013, 4:25 PM

## 2013-07-26 NOTE — Progress Notes (Signed)
BSW Student spoke with Pt's wife and daughter at the bedside. Pt's wife and daughter was giving a list of SNF facilities and was informed to choose a facility for the Pt. Pt's wife and daughter states that they will have a facility picked out by tomorrow.  CSW will follow up with family.  Shalaina Guardiola Jackolyn ConferKershaw BSW-Intern [facility] [phone]

## 2013-07-26 NOTE — Progress Notes (Signed)
[facility] PROGRESS NOTE  Dorie RankJoseph N Bowden ZOX:[id number] DOB: Jun 04, 1931 DOA: 07/23/2013 PCP: Willow OraJose Paz, MD  Assessment/Plan: 1-acute encephalopathy secondary to CVA: Patient with acute small infarcts involving the left palate date/Cordarone a cardiac and left thalamus. -2-D echo (no source of emboli, preserved EF 55-60%; grade 1 diastolic dysfunction -Following neurology recommendations and given the affection of large vessels in the area of stroke, will treat with ASA and plavix for 3 months and subsequent plavix alone for secondary prevention. -Continue risk factors modifications (blood pressure control and cholesterol medications); A1c demonstrating DM to be stable. -Will need SNF at discharge for rehab  2-hypertension: Overall stable with permissive hypertension in the setting of acute ischemic process.  -continue amlodipine -And when necessary hydralazine  3-diabetes mellitus2: Continue a sliding scale insulin and linagliptin.  -A1c 6.9  4-alcohol abuse: At this moment unable to provide counseling due to patient confusion/AMS. -Will continue monitoring on CIWA protocol -started on thiamine -B12 level WNL  5-BPH: Continue Flomax  6-dementia: Continue supportive care. -will continue namenda (after 1 week of treatment increase to BID)  DVT: Lovenox  Code Status: Full Family Communication: Daughter at bedside Disposition Plan: SNF   Consultants:  Neurology (stroke team)  Procedures:  See below for x-ray reports  2-D echo (pending)  Antibiotics:  None  HPI/Subjective: Afebrile, confused and with positive confabulation, no acute distress. Motor strength practically back to normal. Needs SNF.  Objective: Filed Vitals:   07/26/13 2130  BP: 129/76  Pulse: 79  Temp: 98.5 F (36.9 C)  Resp: 18    Intake/Output Summary (Last 24 hours) at 07/26/13 2130 Last data filed at 07/26/13 1300  Gross per 24 hour  Intake    840 ml  Output    900 ml  Net    -60 ml    Filed Weights   07/23/13 0814 07/23/13 2000  Weight: 72.576 kg (160 lb) 74.934 kg (165 lb 3.2 oz)    Exam:   General:  Pleasantly confused, afebrile, able to follow simple commands. No signs of agitation.  Cardiovascular: S1 and S2, positive systolic ejection murmur, no rubs or gallops  Respiratory: Clear to auscultation bilaterally  Abdomen: Soft, nontender, nondistended, positive bowel sounds  Musculoskeletal: No lower extremity swelling, no cyanosis or clubbing. Right leg with scar from previous knee replacement. Full range of motion.  Neurologic exam: Alert, awake and oriented x1 patient able to follow three-step commands. Positive confabulation and easily off topic during conversation. Cranial nerve grossly intact. Muscle strength 5 out of 5 bilaterally and symmetrically except for 4-5 right upper extremity grip. Slight difficulty performing finger to nose and heel to shin (positive dysmetria)  Data Reviewed: Basic Metabolic Panel:  Recent Labs Lab 07/23/13 0836 07/25/13 0346  NA 140 137  K 4.7 4.3  CL 101 99  CO2 26 26  GLUCOSE 102* 118*  BUN 18 19  CREATININE 1.12 1.13  CALCIUM 9.4 8.8   Liver Function Tests:  Recent Labs Lab 07/23/13 0836  AST 20  ALT 14  ALKPHOS 85  BILITOT 0.6  PROT 7.8  ALBUMIN 3.8   CBC:  Recent Labs Lab 07/23/13 0836 07/23/13 1521  WBC 5.4 4.8  HGB 15.0 15.1  HCT 43.5 43.1  MCV 88.4 87.6  PLT 134* 120*   Cardiac Enzymes:  Recent Labs Lab 07/23/13 0836  TROPONINI <0.30   CBG:  Recent Labs Lab 07/25/13 1154 07/25/13 1620 07/26/13 0631 07/26/13 1155 07/26/13 1626  GLUCAP 145* 127* 122* 125* 114*  Studies: No results found.  Scheduled Meds: . amLODipine  10 mg Oral Daily  . aspirin EC  81 mg Oral Daily  . atorvastatin  40 mg Oral q1800  . clopidogrel  75 mg Oral Q breakfast  . enoxaparin (LOVENOX) injection  40 mg Subcutaneous Q24H  . folic acid  1 mg Oral Daily  . insulin aspart  0-15 Units  Subcutaneous TID WC  . linagliptin  5 mg Oral Daily  . memantine  5 mg Oral Daily  . multivitamin with minerals  1 tablet Oral Daily  . tamsulosin  0.4 mg Oral Daily  . thiamine  100 mg Oral Daily   Time spent: >30 minutes (more than 50% of this time dedicated to discuss with patient and family members at bedside plan of care, results and disposition)  Browning [place]wood  [facility] Pager [phone]. If 7PM-7AM, please contact night-coverage at [email or web address], password [email or web address] 07/26/2013, 9:30 PM  LOS: 3 days

## 2013-07-26 NOTE — Progress Notes (Signed)
Clinical Social Work Department BRIEF PSYCHOSOCIAL ASSESSMENT 07/26/2013  Patient:  Christian Erickson,Christian Erickson     Account Number:  [id number]     Admit date:  07/23/2013  Clinical Social Worker:  Leron CroakALLEN,CASSANDRA, CLINICAL SOCIAL WORKER  Date/Time:  07/26/2013 11:43 AM  Referred by:  Physician  Date Referred:  07/26/2013 Referred for  SNF Placement   Other Referral:   Interview type:  Family Other interview type:   BSW student spoke with Pt's wife Christian Erickson at [phone] (hm) over the phone.    PSYCHOSOCIAL DATA Living Status:  FAMILY Admitted from facility:   Level of care:  Skilled Nursing Facility Primary support name:  Christian Erickson [phone] Primary support relationship to patient:  SPOUSE Degree of support available:   Pt have a great support system from wife and daughters.    CURRENT CONCERNS  Other Concerns:    SOCIAL WORK ASSESSMENT / PLAN BSW Student spoke with Pt's wife over the phone regarding D/C planning. Pt's wife wanted me to fax her information on SNF's in the [place] area. Pt's wife informed me that she and her daughter are going out to look at some SNF's today. I informed  Pt's wife i will send Pt's info to SNF's to start the process of finding a SNF, Pt's wife did agree to Bristol-Myers SquibbBSW Student sending Pt's info to SNF's.     BSW student informed Pt's wife that she will follow up with D/C planning.   Assessment/plan status:  Psychosocial Support/Ongoing Assessment of Needs Other assessment/ plan:   Information/referral to community resources:    PATIENT'S/FAMILY'S RESPONSE TO PLAN OF CARE: Pt's wife was concerned about what facility Pt's  is going to. Pt was at a SNF during March of 2014 for two weeks. Pt's wife would like it if Pt's did not go to that facility. I informed Pt's wife there are a variety of SNF's in the area and we will make sure we get the Pt in the best SNF to complete his D/C needs. Pt's wife was very appreciative of services  given.       Leron Croakassandra Allen MSW, Silverio LayLCSWA [place]  BSW Intern Supervisor  [phone]  Christian Erickson BSW-Intern Christian Erickson [phone]

## 2013-07-27 LAB — GLUCOSE, CAPILLARY
Glucose-Capillary: 127 mg/dL — ABNORMAL HIGH (ref 70–99)
Glucose-Capillary: 246 mg/dL — ABNORMAL HIGH (ref 70–99)

## 2013-07-27 LAB — BASIC METABOLIC PANEL
BUN: 21 mg/dL (ref 6–23)
CALCIUM: 9 mg/dL (ref 8.4–10.5)
CO2: 23 mEq/L (ref 19–32)
Chloride: 100 mEq/L (ref 96–112)
Creatinine, Ser: 1 mg/dL (ref 0.50–1.35)
GFR, EST AFRICAN AMERICAN: 80 mL/min — AB (ref >90)
GFR, EST NON AFRICAN AMERICAN: 69 mL/min — AB (ref >90)
Glucose, Bld: 121 mg/dL — ABNORMAL HIGH (ref 70–99)
POTASSIUM: 4.5 meq/L (ref 3.7–5.3)
Sodium: 135 mEq/L — ABNORMAL LOW (ref 137–147)

## 2013-07-27 LAB — CBC
HEMATOCRIT: 45.5 % (ref 39.0–52.0)
Hemoglobin: 15.9 g/dL (ref 13.0–17.0)
MCH: 30.2 pg (ref 26.0–34.0)
MCHC: 34.9 g/dL (ref 30.0–36.0)
MCV: 86.3 fL (ref 78.0–100.0)
Platelets: 132 10*3/uL — ABNORMAL LOW (ref 150–400)
RBC: 5.27 MIL/uL (ref 4.22–5.81)
RDW: 14.7 % (ref 11.5–15.5)
WBC: 6.1 10*3/uL (ref 4.0–10.5)

## 2013-07-27 MED ORDER — SENNOSIDES-DOCUSATE SODIUM 8.6-50 MG PO TABS: 1.0000 | ORAL_TABLET | Freq: Every evening | ORAL | Status: DC | PRN

## 2013-07-27 MED ORDER — AMLODIPINE BESYLATE 10 MG PO TABS: 10.0000 mg | ORAL_TABLET | Freq: Every day | ORAL | Status: DC

## 2013-07-27 MED ORDER — CLOPIDOGREL BISULFATE 75 MG PO TABS: 75.0000 mg | ORAL_TABLET | Freq: Every day | ORAL | Status: DC

## 2013-07-27 MED ORDER — MEMANTINE HCL 5 MG PO TABS: 5.0000 mg | ORAL_TABLET | Freq: Every day | ORAL | Status: DC

## 2013-07-27 MED ORDER — ADULT MULTIVITAMIN W/MINERALS CH: 1.0000 | ORAL_TABLET | Freq: Every day | ORAL | Status: AC

## 2013-07-27 MED ORDER — INSULIN ASPART 100 UNIT/ML ~~LOC~~ SOLN: 0.0000 [IU] | Freq: Three times a day (TID) | SUBCUTANEOUS | Status: DC

## 2013-07-27 MED ORDER — FOLIC ACID 1 MG PO TABS: 1.0000 mg | ORAL_TABLET | Freq: Every day | ORAL | Status: DC

## 2013-07-27 MED ORDER — THIAMINE HCL 100 MG PO TABS: 100.0000 mg | ORAL_TABLET | Freq: Every day | ORAL | Status: DC

## 2013-07-27 MED ORDER — ATORVASTATIN CALCIUM 40 MG PO TABS: 40.0000 mg | ORAL_TABLET | Freq: Every day | ORAL | Status: DC

## 2013-07-27 NOTE — Progress Notes (Signed)
[facility].  Condom Cath left in placed to be removed at facility.  Vital signs and assessments were stable.

## 2013-07-27 NOTE — Discharge Summary (Signed)
Physician Discharge Summary  Christian Erickson:[id number] DOB: [birth year] DOA: 07/23/2013  PCP: Willow Ora, MD  Admit date: 07/23/2013 Discharge date: 07/27/2013  Time spent: 35 minutes  Recommendations for Outpatient Follow-up:  1. Increase namenda to BID in 1 week 2. ASA/plavix x 3 months then plavix daily  Discharge Diagnoses:  Active Problems:   Stroke   Acute CVA (cerebrovascular accident)   Discharge Condition: improved  Diet recommendation: cardiac/diabetic  Filed Weights   07/23/13 0814 07/23/13 2000  Weight: 72.576 kg (160 lb) 74.934 kg (165 lb 3.2 oz)    History of present illness:  80 year old male with a history of diabetes, hypertension, dyslipidemia, presents to the ED after a fall. The patient was trying to go to bed and 5 AM this morning when his wife heard a thud, , she ran outside and found her husband on the floor, however he was sitting up and wide awake. He was unable to follow commands or any of her instructions to try to get him to stand up. She called EMS. He was found to be hypertensive with systolic blood pressure in the 190s, found to have a left-sided gaze, slurred speech, right-sided weakness.  Most of the history is obtained from the daughter and the wife are currently present by the bedside. CBG was 131 upon arrival he was found to be in sinus rhythm/sinus bradycardia.  He was found to have improving grip strength, in his right hand, but still a significant amount of motor weakness on the right side.  At baseline the patient's functional status is deteriorating slowly. Daughter states that he's had some short-term memory loss. He can go about his activities of daily living, he still drives, walks independently. However he sometimes forgets to take his medications. His sleep pattern has been problematic and he sleeps from 5 AM to 4 PM.  Was told by his PCP that he and signs of early dementia.   Hospital Course:  acute encephalopathy secondary to CVA:  Patient with acute small infarcts involving the left palate date/Cordarone a cardiac and left thalamus.  -2-D echo (no source of emboli, preserved EF 55-60%; grade 1 diastolic dysfunction  -Following neurology recommendations and given the affection of large vessels in the area of stroke, will treat with ASA and plavix for 3 months and subsequent plavix alone for secondary prevention.  -Continue risk factors modifications (blood pressure control and cholesterol medications); A1c demonstrating DM to be stable.  -vascular lab: 1-39% right ICA stenosis. 40-59% left ICA stenosis: CT angiogram:1-39% right ICA stenosis. 40-59% left ICA stenosis    hypertension: Overall stable .  -continue amlodipine   diabetes mellitus2: Continue a sliding scale insulin and linagliptin.  -A1c 6.9   alcohol abuse: At this moment unable to provide counseling due to patient confusion/AMS.  -started on thiamine  -B12 level WNL   BPH: Continue Flomax   dementia: Continue supportive care.  -will continue namenda (after 1 week of treatment increase to BID)   Procedures:  Echo  carotids  Consultations:  neuro  Discharge Exam: Filed Vitals:   07/27/13 1009  BP: 130/63  Pulse: 78  Temp: 98.1 F (36.7 C)  Resp: 18    General: pleasant/cooperative Cardiovascular: rrr Respiratory: clear anterior  Discharge Instructions      Discharge Orders   Future Appointments Provider Department Dept Phone   10/06/2013 10:00 AM Wanda Plump, MD [facility]  [place] [phone]   Future Orders Complete By Expires   Diet - low sodium  heart healthy  As directed    Diet Carb Modified  As directed    Discharge instructions  As directed    Comments:     Follow blood sugar ASA/plavix for 3 months then plavix   Increase activity slowly  As directed        Medication List    STOP taking these medications       doxazosin 2 MG tablet  Commonly known as:  CARDURA     glucose blood test  strip     metFORMIN 1000 MG tablet  Commonly known as:  GLUCOPHAGE     ONE TOUCH LANCETS Misc     OVER THE COUNTER MEDICATION     OVER THE COUNTER MEDICATION     OVER THE COUNTER MEDICATION     SAW PALMETTO PLUS Caps      TAKE these medications       acetaminophen 500 MG tablet  Commonly known as:  TYLENOL  Take 1,000 mg by mouth every 6 (six) hours as needed (pain).     amLODipine 10 MG tablet  Commonly known as:  NORVASC  Take 1 tablet (10 mg total) by mouth daily.     aspirin EC 81 MG tablet  Take 81 mg by mouth daily.     atorvastatin 40 MG tablet  Commonly known as:  LIPITOR  Take 1 tablet (40 mg total) by mouth daily at 6 PM.     bimatoprost 0.03 % ophthalmic solution  Commonly known as:  LUMIGAN  Place 1 drop into both eyes at bedtime.     clopidogrel 75 MG tablet  Commonly known as:  PLAVIX  Take 1 tablet (75 mg total) by mouth daily with breakfast.     fluticasone 50 MCG/ACT nasal spray  Commonly known as:  FLONASE  Place 2 sprays into both nostrils daily as needed for allergies or rhinitis.     folic acid 1 MG tablet  Commonly known as:  FOLVITE  Take 1 tablet (1 mg total) by mouth daily.     insulin aspart 100 UNIT/ML injection  Commonly known as:  novoLOG  Inject 0-15 Units into the skin 3 (three) times daily with meals.     memantine 5 MG tablet  Commonly known as:  NAMENDA  Take 1 tablet (5 mg total) by mouth daily.     multivitamin with minerals Tabs tablet  Take 1 tablet by mouth daily.     ONGLYZA 5 MG Tabs tablet  Generic drug:  saxagliptin HCl  Take 5 mg by mouth daily.     senna-docusate 8.6-50 MG per tablet  Commonly known as:  Senokot-S  Take 1 tablet by mouth at bedtime as needed for moderate constipation.     tamsulosin 0.4 MG Caps capsule  Commonly known as:  FLOMAX  Take 1 capsule (0.4 mg total) by mouth daily.     thiamine 100 MG tablet  Take 1 tablet (100 mg total) by mouth daily.       Allergies  Allergen  Reactions  . Beef-Derived Products Swelling  . Pork-Derived Products Swelling  . Vasotec     Angioedema, see OV 09-05-11   Follow-up Information   Follow up with Gates Rigg, MD In 2 months. (Call for appointment)    Specialties:  Neurology, Radiology   Contact information:   [address] [place] [address] [phone]        The results of significant diagnostics from this hospitalization (including imaging,  microbiology, ancillary and laboratory) are listed below for reference.    Significant Diagnostic Studies: Ct Angio Head W/cm &/or Wo Cm  07/24/2013   CLINICAL DATA:  Left-sided CVA.  EXAM: CT ANGIOGRAPHY HEAD AND NECK  TECHNIQUE: Multidetector CT imaging of the head and neck was performed using the standard protocol during bolus administration of intravenous contrast. Multiplanar CT image reconstructions including MIPs were obtained to evaluate the vascular anatomy. Carotid stenosis measurements (when applicable) are obtained utilizing NASCET criteria, using the distal internal carotid diameter as the denominator.  CONTRAST:  50mL OMNIPAQUE IOHEXOL 350 MG/ML SOLN  COMPARISON:  MRI brain 07/23/2013.  FINDINGS: CTA HEAD FINDINGS  Infarcts involving the left basal ganglia are again noted. Atrophy and white matter disease is stable. No hemorrhage or mass lesion is present. The ventricles are proportionate to the degree of atrophy.  The paranasal sinuses and mastoid air cells are clear. The osseous skull is intact.  The postcontrast images demonstrate no pathologic enhancement.  There is moderate calcific stenosis of the cavernous carotid artery just distal to the genu as well. A high-grade stenosis is present in the distal left cavernous and para ophthalmic ICA with narrowing of the supra clinoid segment.  The left A1 segment is dominant. The anterior communicating artery is patent. The carotid bifurcations are intact. The ACA and MCA bifurcations are unremarkable.  The  vertebrobasilar junction is within normal limits. The basilar artery is normal. Prominent AICA vessels are seen bilaterally. Small P1 segments are evident. There are fetal type posterior cerebral arteries bilaterally.  The dural sinuses are patent.  The lung apices are clear.  Review of the MIP images confirms the above findings.  CTA NECK FINDINGS  A standard 3 vessel arch configuration is present. Node mild atherosclerotic calcifications are present at the origin of the great vessels without stenosis. The vertebral arteries originate from the subclavian arteries bilaterally without significant stenosis. The left vertebral artery is the dominant vessel. There are no significant stenoses within the cervical vertebral arteries.  The right common carotid artery is within normal limits. Dense atherosclerotic calcifications are present at the right carotid bifurcation without a significant stenosis. The right cervical ICA demonstrates mild distal tortuosity without significant stenosis.  The left common carotid artery demonstrates mild proximal tortuosity. Dense calcifications are present at the left carotid bifurcation. There is no significant stenosis relative to the distal vessel. The proximal left ICA deviates medially.  Soft tissues of the neck are otherwise unremarkable. The thyroid is within normal limits. The vocal cords are midline. No significant cervical adenopathy is present.  Multilevel degenerative changes are noted cervical spine. Endplate osteophyte formation and uncovertebral spurring is most evident at C5-6 and C6-7.  Review of the MIP images confirms the above findings.  IMPRESSION: 1. Dense atherosclerotic calcifications at the carotid bifurcations bilaterally without definite stenosis on either side. 2. Tortuosity of the distal right ICA and medial deviation of the proximal left ICA. 3. Moderate spondylosis of the cervical spine at C5-6 and C6-7. 4. Moderate to high-grade calcific stenoses within  the cavernous carotid arteries bilaterally. The left sided stenosis is more distal, at the ophthalmic segment. This may contribute to the patient's infarcts. 5. Expected evolution of the infarcts without hemorrhage.   Electronically Signed   By: Gennette Pac M.D.   On: 07/24/2013 14:36   Dg Chest 2 View  07/23/2013   CLINICAL DATA:  History of acute CVA, history of diabetes and tobacco use now with stroke symptoms.  EXAM: CHEST  2 VIEW  COMPARISON:  PA and lateral chest x-ray of Nov 16, 2012  FINDINGS: The lungs are adequately inflated. There is no focal infiltrate. There is no evidence of atelectasis or pleural effusion. The cardiopericardial silhouette is mildly enlarged. The pulmonary vascularity is not engorged. There is tortuosity of the descending thoracic aorta. The observed portions of the bony thorax exhibit no acute abnormalities.  IMPRESSION: 1. There is no evidence of pneumonia nor atelectasis. 2. There is mild enlargement of the cardiac silhouette without pulmonary vascular congestion. 3. There is atherosclerotic tortuosity of the descending thoracic aorta which is stable.   Electronically Signed   By: David  [place]   On: 07/23/2013 17:19   Ct Head Wo Contrast (only If Suspected Head Trauma And/or Pt Is On Anticoagulant)  07/23/2013   CLINICAL DATA:  Mental status change with slurred speech and gait ease disturbance. , history of dementia  EXAM: CT HEAD WITHOUT CONTRAST  TECHNIQUE: Contiguous axial images were obtained from the base of the skull through the vertex without intravenous contrast.  COMPARISON:  None.  FINDINGS: There is mild diffuse cerebral and cerebellar atrophy with compensatory ventriculomegaly. There is no shift of the midline. There is no evidence of an acute intracranial hemorrhage nor of an evolving ischemic infarction. There are no abnormal intracranial calcifications. There is subtle decreased density in the deep white matter of both cerebral hemispheres consistent with  chronic small vessel ischemic change.  At bone window settings the observed portions of the paranasal sinuses and mastoid air cells are clear with the exception of the left frontal sinus which is opacified. There is no evidence of an acute skull fracture nor of a cephalohematoma.  IMPRESSION: 1. There is no evidence of an acute intracranial hemorrhage nor of an evolving ischemic infarction. 2. There is mild diffuse cerebral and cerebellar atrophy with compensatory ventriculomegaly. These findings are not out of proportion to the patient's age. 3. There is opacification of the left frontal sinus which may reflect acute inflammation. I have no previous studies with which to compare.   Electronically Signed   By: David  [place]   On: 07/23/2013 09:29   Ct Angio Neck W/cm &/or Wo/cm  07/24/2013   CLINICAL DATA:  Left-sided CVA.  EXAM: CT ANGIOGRAPHY HEAD AND NECK  TECHNIQUE: Multidetector CT imaging of the head and neck was performed using the standard protocol during bolus administration of intravenous contrast. Multiplanar CT image reconstructions including MIPs were obtained to evaluate the vascular anatomy. Carotid stenosis measurements (when applicable) are obtained utilizing NASCET criteria, using the distal internal carotid diameter as the denominator.  CONTRAST:  50mL OMNIPAQUE IOHEXOL 350 MG/ML SOLN  COMPARISON:  MRI brain 07/23/2013.  FINDINGS: CTA HEAD FINDINGS  Infarcts involving the left basal ganglia are again noted. Atrophy and white matter disease is stable. No hemorrhage or mass lesion is present. The ventricles are proportionate to the degree of atrophy.  The paranasal sinuses and mastoid air cells are clear. The osseous skull is intact.  The postcontrast images demonstrate no pathologic enhancement.  There is moderate calcific stenosis of the cavernous carotid artery just distal to the genu as well. A high-grade stenosis is present in the distal left cavernous and para ophthalmic ICA with narrowing  of the supra clinoid segment.  The left A1 segment is dominant. The anterior communicating artery is patent. The carotid bifurcations are intact. The ACA and MCA bifurcations are unremarkable.  The vertebrobasilar junction is within normal limits. The basilar artery is normal.  Prominent AICA vessels are seen bilaterally. Small P1 segments are evident. There are fetal type posterior cerebral arteries bilaterally.  The dural sinuses are patent.  The lung apices are clear.  Review of the MIP images confirms the above findings.  CTA NECK FINDINGS  A standard 3 vessel arch configuration is present. Node mild atherosclerotic calcifications are present at the origin of the great vessels without stenosis. The vertebral arteries originate from the subclavian arteries bilaterally without significant stenosis. The left vertebral artery is the dominant vessel. There are no significant stenoses within the cervical vertebral arteries.  The right common carotid artery is within normal limits. Dense atherosclerotic calcifications are present at the right carotid bifurcation without a significant stenosis. The right cervical ICA demonstrates mild distal tortuosity without significant stenosis.  The left common carotid artery demonstrates mild proximal tortuosity. Dense calcifications are present at the left carotid bifurcation. There is no significant stenosis relative to the distal vessel. The proximal left ICA deviates medially.  Soft tissues of the neck are otherwise unremarkable. The thyroid is within normal limits. The vocal cords are midline. No significant cervical adenopathy is present.  Multilevel degenerative changes are noted cervical spine. Endplate osteophyte formation and uncovertebral spurring is most evident at C5-6 and C6-7.  Review of the MIP images confirms the above findings.  IMPRESSION: 1. Dense atherosclerotic calcifications at the carotid bifurcations bilaterally without definite stenosis on either side. 2.  Tortuosity of the distal right ICA and medial deviation of the proximal left ICA. 3. Moderate spondylosis of the cervical spine at C5-6 and C6-7. 4. Moderate to high-grade calcific stenoses within the cavernous carotid arteries bilaterally. The left sided stenosis is more distal, at the ophthalmic segment. This may contribute to the patient's infarcts. 5. Expected evolution of the infarcts without hemorrhage.   Electronically Signed   By: Gennette Pac M.D.   On: 07/24/2013 14:36   Mr Brain Wo Contrast  07/23/2013   CLINICAL DATA:  Altered mental status.  New confusion.  EXAM: MRI HEAD WITHOUT CONTRAST  TECHNIQUE: Multiplanar, multiecho pulse sequences of the brain and surrounding structures were obtained without intravenous contrast.  COMPARISON:  Head CT 07/23/2013  FINDINGS: There is a 6 mm focus of restricted diffusion involving the left caudate/corona radiata with an additional 1 cm focus of restricted diffusion involving the anterior left thalamus. There is no intracranial hemorrhage. Periventricular T2 hyperintensities are nonspecific but compatible with mild chronic small vessel ischemic disease. There is moderate cerebral atrophy. There is no evidence of mass, midline shift, or extra-axial fluid collection. Major intracranial vascular flow voids are unremarkable. Orbits are normal. Left frontal sinus mucosal thickening is noted.  IMPRESSION: Small acute infarcts involving the left caudate/corona radiata and left thalamus. No hemorrhage.   Electronically Signed   By: Sebastian Ache   On: 07/23/2013 14:06    Microbiology: No results found for this or any previous visit (from the past 240 hour(s)).   Labs: Basic Metabolic Panel:  Recent Labs Lab 07/23/13 0836 07/25/13 0346 07/27/13 0430  NA 140 137 135*  K 4.7 4.3 4.5  CL 101 99 100  CO2 26 26 23   GLUCOSE 102* 118* 121*  BUN 18 19 21   CREATININE 1.12 1.13 1.00  CALCIUM 9.4 8.8 9.0   Liver Function Tests:  Recent Labs Lab  07/23/13 0836  AST 20  ALT 14  ALKPHOS 85  BILITOT 0.6  PROT 7.8  ALBUMIN 3.8   No results found for this basename: LIPASE, AMYLASE,  in the last 168 hours No results found for this basename: AMMONIA,  in the last 168 hours CBC:  Recent Labs Lab 07/23/13 0836 07/23/13 1521 07/27/13 0430  WBC 5.4 4.8 6.1  HGB 15.0 15.1 15.9  HCT 43.5 43.1 45.5  MCV 88.4 87.6 86.3  PLT 134* 120* 132*   Cardiac Enzymes:  Recent Labs Lab 07/23/13 0836  TROPONINI <0.30   BNP: BNP (last 3 results) No results found for this basename: PROBNP,  in the last 8760 hours CBG:  Recent Labs Lab 07/26/13 0631 07/26/13 1155 07/26/13 1626 07/26/13 2107 07/27/13 0641  GLUCAP 122* 125* 114* 137* 127*       Signed:  Arley Garant  [facility] 07/27/2013, 11:05 AM

## 2013-08-02 ENCOUNTER — Telehealth: Payer: Self-pay | Admitting: *Deleted

## 2013-08-02 NOTE — Telephone Encounter (Signed)
Patient wife called to inform dr Drue Novelpaz that her husband had a stroke last Friday at his nursing home facility. Also briefly talk to dr Drue Novelpaz about what would be her next step.

## 2013-08-02 NOTE — Telephone Encounter (Signed)
Spoke with the patient's wife, he is now on a nursing home, physically he is making a lot of progress, his dementia has gotten worse. She is concerned about the next steps, I recommend him to continue under the care of physical therapy, occupational therapy and the facility doctor. At some point a decision will have to be made about his next step, the first thing is that wherever he goes needs to be a safe place for his skill levels. She knows I'm available for question.

## 2013-08-16 ENCOUNTER — Ambulatory Visit (INDEPENDENT_AMBULATORY_CARE_PROVIDER_SITE_OTHER): Payer: Medicare Other | Admitting: Internal Medicine

## 2013-08-16 ENCOUNTER — Encounter: Payer: Self-pay | Admitting: Internal Medicine

## 2013-08-16 VITALS — BP 145/71 | HR 76 | Temp 98.2°F | Wt 170.0 lb

## 2013-08-16 DIAGNOSIS — I10 Essential (primary) hypertension: Secondary | ICD-10-CM

## 2013-08-16 DIAGNOSIS — I639 Cerebral infarction, unspecified: Secondary | ICD-10-CM

## 2013-08-16 DIAGNOSIS — R609 Edema, unspecified: Secondary | ICD-10-CM

## 2013-08-16 DIAGNOSIS — I6350 Cerebral infarction due to unspecified occlusion or stenosis of unspecified cerebral artery: Secondary | ICD-10-CM

## 2013-08-16 DIAGNOSIS — E785 Hyperlipidemia, unspecified: Secondary | ICD-10-CM

## 2013-08-16 DIAGNOSIS — E119 Type 2 diabetes mellitus without complications: Secondary | ICD-10-CM

## 2013-08-16 DIAGNOSIS — R413 Other amnesia: Secondary | ICD-10-CM

## 2013-08-16 NOTE — Patient Instructions (Signed)
Continue with same medications Check your blood sugar 3 times a day, fax results to me in one week [phone] Next visit in 3 weeks

## 2013-08-16 NOTE — Assessment & Plan Note (Addendum)
Recovering from a stroke, currently a rehabilitation facility---- Christian BridegroomShannon Erickson. Undergoing physical therapy. Making progress physically however he is not ready to go home, family is working on getting him into a memory unit (see decrease in memory) Plan is to continue with aspirin and Plavix for 3 months, then stay on Plavix only per neurology recommendation.

## 2013-08-16 NOTE — Assessment & Plan Note (Signed)
See comments under hypertension, we'll reassess in 3 weeks

## 2013-08-16 NOTE — Progress Notes (Signed)
Pre visit review using our clinic review tool, if applicable. No additional management support is needed unless otherwise documented below in the visit note. 

## 2013-08-16 NOTE — Assessment & Plan Note (Addendum)
In the hospital, they  discontinue metformin On onglyza and sliding scale  insulin.  No ambulatory blood sugars are available

## 2013-08-16 NOTE — Assessment & Plan Note (Addendum)
Well-controlled however he has developed edema. Chart reviewed vasotec was d/c 3-13 d/t a episode of angio edema clonidin intolerant 09-2011  Edema is likely from amlodipine,  I don't like to do too many changes in his meds, for now will tolerate the edema, re asses on RTC in 3 weeks

## 2013-08-16 NOTE — Assessment & Plan Note (Signed)
Wife reported that the patient memory is deteriorating gradually. Caregivers [facility] rec a memory unit per pt's wife  Currently on Namenda 5 mg twice a day. Plan is to reassess on return to the office

## 2013-08-16 NOTE — Progress Notes (Signed)
Subjective:    Patient ID: Christian Erickson, male    DOB: Feb 01, 1931, 80 y.o.   MRN: [id number]  DOS:  08/16/2013 Hospital followup  Admit date: 07/23/2013  Discharge date: 07/27/2013 Patient was admitted to the hospital with a stroke, left-sided gaze, slurred speech and right sided weakness. Echocardiogram showed and normal EF, no source of emboli. Neurology recommended aspirin and Plavix for 3 months and then Plavix alone. Labs , XR: Kidney function is stable, last sodium 135. LFTs normal. LDL 98, A1c 6.8, CBC normal MRI brain: Small acute infarcts involving the left caudate/corona radiata and  left thalamus. No hemorrhage. vascular lab:   CT angiogram:1-39% right ICA stenosis. 40-59% left ICA stenosis     Past Medical History  Diagnosis Date  . BPH (benign prostatic hypertrophy)   . Diabetes mellitus   . Hypertension   . Hyperlipidemia   . Diverticulitis   . ED (erectile dysfunction)   . Glaucoma     sees eye doctor routinely  . Allergic rhinitis   . Subclinical hypothyroidism   . Arthritis   . Blood dyscrasia     Past Surgical History  Procedure Laterality Date  . Inguinal hernia repair    . Colectomy      w/ reversal due to divertiuclitis per patient in 1990s aprox  . Sbo repair    . Total knee arthroplasty Right 10/27/2012    Procedure: TOTAL KNEE ARTHROPLASTY;  Surgeon: Loreta Ave, MD;  Location: [facility] OR;  Service: Orthopedics;  Laterality: Right;    History   Social History  . Marital Status: Married    Spouse Name: N/A    Number of Children: 2  . Years of Education: N/A   Occupational History  . Retired-- Hotel manager, [employer]    Social History Main Topics  . Smoking status: Former Smoker    Types: Cigarettes  . Smokeless tobacco: Never Used     Comment: used to smoke rarely  . Alcohol Use: Yes     Comment: rarely  . Drug Use: No  . Sexual Activity: Not on file   Other Topics Concern  . Not on file   Social History Narrative   Wife  had CABG 2011   Married x > 44 years, children x 2 , 2 G daughters          ROS  she is here with his wife, since he left the hospital he is at a rehabilitation facility, doing physical therapy. Denies chest pain, shortness or breath Appetite is very good Denies depression or anxiety. Wife reports he is physically recovering very well, has regained his strength, his dementia is however definitely worse per wife; the people at the rehabilitation facility also  think that he is not safe to be at at  home and are actually thinking he needs to be in the memory unit. He wanders at times, no violent behavior .  Medications were changed in the hospital, see assessment and plan    Objective:   Physical Exam BP 145/71  Pulse 76  Temp(Src) 98.2 F (36.8 C)  Wt 170 lb (77.111 kg)  SpO2 98% General -- alert, well-developed, NAD.   Lungs -- normal respiratory effort, no intercostal retractions, no accessory muscle use, and normal breath sounds.  Heart-- normal rate, regular rhythm, no murmur.  Extremities-- +/+++ pretibial edema bilaterally  Neurologic--  alert & oriented in time, self, place, recognizes his wife. EOMI, pupils are very small and hyporeactive Face symmetric, extremities  w/  minimal if any right-sided weakness   Psych--  No anxious or depressed appearing.      Assessment & Plan:

## 2013-08-16 NOTE — Assessment & Plan Note (Signed)
last  LDL 98 , Lipitor was increased from 20-40 mg while in hospital

## 2013-08-17 ENCOUNTER — Telehealth: Payer: Self-pay | Admitting: Internal Medicine

## 2013-08-17 NOTE — Telephone Encounter (Signed)
Relevant patient education assigned to patient using Emmi. ° °

## 2013-08-18 ENCOUNTER — Telehealth: Payer: Self-pay

## 2013-08-18 NOTE — Telephone Encounter (Signed)
Relevant patient education assigned to patient using Emmi. ° °

## 2013-09-02 ENCOUNTER — Telehealth: Payer: Self-pay | Admitting: *Deleted

## 2013-09-02 ENCOUNTER — Other Ambulatory Visit: Payer: Self-pay | Admitting: *Deleted

## 2013-09-02 MED ORDER — MEMANTINE HCL 5 MG PO TABS: 5.0000 mg | ORAL_TABLET | Freq: Two times a day (BID) | ORAL | Status: DC

## 2013-09-02 MED ORDER — ZOLPIDEM TARTRATE 5 MG PO TABS: 5.0000 mg | ORAL_TABLET | Freq: Every evening | ORAL | Status: DC | PRN

## 2013-09-02 MED ORDER — CLOPIDOGREL BISULFATE 75 MG PO TABS: 75.0000 mg | ORAL_TABLET | Freq: Every day | ORAL | Status: DC

## 2013-09-02 NOTE — Telephone Encounter (Signed)
Was started on Ambien elsewhere, okay to refill, no need for UDS

## 2013-09-02 NOTE — Telephone Encounter (Signed)
rx faxed to [facility] [address]

## 2013-09-02 NOTE — Telephone Encounter (Signed)
rx refill- ambein 5mg   Last OV- 08/16/13 Last refilled- historical provider  Last UDS- none .

## 2013-09-02 NOTE — Addendum Note (Signed)
Addended by: Wanda PlumpPAZ, JOSE E on: 09/02/2013 03:43 PM   Modules accepted: Orders

## 2013-09-06 ENCOUNTER — Ambulatory Visit: Payer: Medicare Other | Admitting: Internal Medicine

## 2013-09-07 ENCOUNTER — Telehealth: Payer: Self-pay | Admitting: *Deleted

## 2013-09-08 NOTE — Telephone Encounter (Signed)
Error

## 2013-09-09 ENCOUNTER — Telehealth: Payer: Self-pay | Admitting: Internal Medicine

## 2013-09-09 NOTE — Telephone Encounter (Signed)
Patient's spouse called stating the pharmacy never received rx for Plavix. Pt uses [facility] on [address].

## 2013-09-09 NOTE — Telephone Encounter (Signed)
rx called in.  Pt notified

## 2013-09-12 ENCOUNTER — Ambulatory Visit (INDEPENDENT_AMBULATORY_CARE_PROVIDER_SITE_OTHER): Payer: Medicare Other | Admitting: Internal Medicine

## 2013-09-12 ENCOUNTER — Encounter: Payer: Self-pay | Admitting: Internal Medicine

## 2013-09-12 VITALS — BP 145/69 | HR 65 | Temp 98.0°F | Wt 166.0 lb

## 2013-09-12 DIAGNOSIS — I10 Essential (primary) hypertension: Secondary | ICD-10-CM

## 2013-09-12 DIAGNOSIS — E119 Type 2 diabetes mellitus without complications: Secondary | ICD-10-CM

## 2013-09-12 DIAGNOSIS — E785 Hyperlipidemia, unspecified: Secondary | ICD-10-CM

## 2013-09-12 DIAGNOSIS — I639 Cerebral infarction, unspecified: Secondary | ICD-10-CM

## 2013-09-12 DIAGNOSIS — I6350 Cerebral infarction due to unspecified occlusion or stenosis of unspecified cerebral artery: Secondary | ICD-10-CM

## 2013-09-12 DIAGNOSIS — R413 Other amnesia: Secondary | ICD-10-CM

## 2013-09-12 MED ORDER — LEVOTHYROXINE SODIUM 25 MCG PO TABS: 25.0000 ug | ORAL_TABLET | Freq: Every day | ORAL | Status: DC

## 2013-09-12 MED ORDER — CARVEDILOL 6.25 MG PO TABS: 6.2500 mg | ORAL_TABLET | Freq: Two times a day (BID) | ORAL | Status: DC

## 2013-09-12 MED ORDER — METFORMIN HCL 500 MG PO TABS: 500.0000 mg | ORAL_TABLET | Freq: Two times a day (BID) | ORAL | Status: DC

## 2013-09-12 MED ORDER — LEVOTHYROXINE SODIUM 25 MCG PO TABS
25.0000 ug | ORAL_TABLET | Freq: Every day | ORAL | Status: DC
Start: 2013-09-12 — End: 2013-10-24

## 2013-09-12 NOTE — Progress Notes (Signed)
pt's [facility] examination form - for housebound status or permanent need for regular aid and attendance  Filled out / given to pt . Copy made ready to be scanned.

## 2013-09-12 NOTE — Patient Instructions (Addendum)
Get your blood work before you leave   Instructions for [facility]: Discontinue insulin sliding scale Start metformin 500 mg one tablet twice a day  Stop amlodipine Start carvedilol twice a day  Continue checking CBGs as you are doing, Call me if they are persistently more than 200 or less than 90 Check the  blood pressure 3 times a week be sure it is between 110/60 and 140/85. Ideal blood pressure is 120/80. If it is consistently higher or lower, let me know   Next visit is for routine check up   in 2 months  No need to come back fasting Please make an appointment   .

## 2013-09-12 NOTE — Assessment & Plan Note (Addendum)
Making some progress. Soon to restart PT, OT. [facility]. Refer to neurology.

## 2013-09-12 NOTE — Assessment & Plan Note (Signed)
Currently at the memory unit [facility], extensive [place] the [place] provided

## 2013-09-12 NOTE — Assessment & Plan Note (Signed)
Recent a cholesterol medication dose increase, check a cholesterol panel and LFTs.

## 2013-09-12 NOTE — Progress Notes (Signed)
Pre visit review using our clinic review tool, if applicable. No additional management support is needed unless otherwise documented below in the visit note. 

## 2013-09-12 NOTE — Progress Notes (Signed)
Subjective:    Patient ID: Christian Erickson, male    DOB: [birth year], 80 y.o.   MRN: [id number]  DOS:  09/12/2013 Type of  visit:  Followup from previous visit , Here with his wife. Stroke--recovering, see assessment and plan. Hypertension--continue with edema. Discontinue amlodipine? Ambulatory BPs Usually okay. Diabetes--continue with insulin? Usually gets 2 units 3 times a day because her sugars are Not very high.    ROS No  CP, SOB Denies  nausea, vomiting diarrhea Denies  blood in the stools No diff swallowing  No dysphagia or odynophagia (-) cough, sputum production (-) wheezing, chest congestion   No anxiety, depression    Past Medical History  Diagnosis Date  . BPH (benign prostatic hypertrophy)   . Diabetes mellitus   . Hypertension   . Hyperlipidemia   . Diverticulitis   . ED (erectile dysfunction)   . Glaucoma     sees eye doctor routinely  . Allergic rhinitis   . Subclinical hypothyroidism   . Arthritis   . Blood dyscrasia     Past Surgical History  Procedure Laterality Date  . Inguinal hernia repair    . Colectomy      w/ reversal due to divertiuclitis per patient in 1990s aprox  . Sbo repair    . Total knee arthroplasty Right 10/27/2012    Procedure: TOTAL KNEE ARTHROPLASTY;  Surgeon: Loreta Ave, MD;  Location: [facility] OR;  Service: Orthopedics;  Laterality: Right;    History   Social History  . Marital Status: Married    Spouse Name: N/A    Number of Children: 2  . Years of Education: N/A   Occupational History  . Retired-- Hotel manager, [employer]    Social History Main Topics  . Smoking status: Former Smoker    Types: Cigarettes  . Smokeless tobacco: Never Used     Comment: used to smoke rarely  . Alcohol Use: Yes     Comment: rarely  . Drug Use: No  . Sexual Activity: Not on file   Other Topics Concern  . Not on file   Social History Narrative   Wife had CABG 2011   Married x > 44 years, children x 2 , 2 G daughters   Lives  at the memory unit ([facility] as of 09-2013)                 Medication List       This list is accurate as of: 09/12/13  6:55 PM.  Always use your most recent med list.               acetaminophen 500 MG tablet  Commonly known as:  TYLENOL  Take 1,000 mg by mouth every 6 (six) hours as needed (pain).     aspirin EC 81 MG tablet  Take 81 mg by mouth daily.     atorvastatin 40 MG tablet  Commonly known as:  LIPITOR  Take 1 tablet (40 mg total) by mouth daily at 6 PM.     bimatoprost 0.03 % ophthalmic solution  Commonly known as:  LUMIGAN  Place 1 drop into both eyes at bedtime.     carvedilol 6.25 MG tablet  Commonly known as:  COREG  Take 1 tablet (6.25 mg total) by mouth 2 (two) times daily with a meal.     clopidogrel 75 MG tablet  Commonly known as:  PLAVIX  Take 1 tablet (75 mg total) by mouth daily with breakfast.  fluticasone 50 MCG/ACT nasal spray  Commonly known as:  FLONASE  Place 2 sprays into both nostrils daily as needed for allergies or rhinitis.     folic acid 1 MG tablet  Commonly known as:  FOLVITE  Take 1 tablet (1 mg total) by mouth daily.     levothyroxine 25 MCG tablet  Commonly known as:  SYNTHROID, LEVOTHROID  Take 1 tablet (25 mcg total) by mouth daily before breakfast.     levothyroxine 25 MCG tablet  Commonly known as:  SYNTHROID, LEVOTHROID  Take 1 tablet (25 mcg total) by mouth daily before breakfast.     memantine 5 MG tablet  Commonly known as:  NAMENDA  Take 1 tablet (5 mg total) by mouth 2 (two) times daily.     metFORMIN 500 MG tablet  Commonly known as:  GLUCOPHAGE  Take 1 tablet (500 mg total) by mouth 2 (two) times daily with a meal.     multivitamin with minerals Tabs tablet  Take 1 tablet by mouth daily.     ONGLYZA 5 MG Tabs tablet  Generic drug:  saxagliptin HCl  Take 5 mg by mouth daily.     senna-docusate 8.6-50 MG per tablet  Commonly known as:  Senokot-S  Take 1 tablet by mouth at bedtime as needed  for moderate constipation.     tamsulosin 0.4 MG Caps capsule  Commonly known as:  FLOMAX  Take 1 capsule (0.4 mg total) by mouth daily.     thiamine 100 MG tablet  Take 1 tablet (100 mg total) by mouth daily.     zolpidem 5 MG tablet  Commonly known as:  AMBIEN  Take 1 tablet (5 mg total) by mouth at bedtime as needed for sleep.           Objective:   Physical Exam BP 145/69  Pulse 65  Temp(Src) 98 F (36.7 C)  Wt 166 lb (75.297 kg)  SpO2 99% General -- alert, well-developed, NAD.  Lungs -- normal respiratory effort, no intercostal retractions, no accessory muscle use, and normal breath sounds.  Heart-- normal rate, regular rhythm, no murmur.  Extremities-- ++/+++ pretibial edema bilaterally , Slightly worse on the left, distal from the  calves. Calves are symmetric Neurologic--   gait -- Able to walk without a walker. Psych--  Cooperative with normal attention span and concentration. No anxious or depressed appearing.     Assessment & Plan:    Today , I spent more than 45 min with the patient, >50% of the time counseling regards his condition, answer multiple questions to the patient's wife and coordinating his care including extensive paperwork for the [place].

## 2013-09-12 NOTE — Assessment & Plan Note (Signed)
Currently on onglyza and sliding scale insulin. CBGs usually in the low 100s. Discontinue insulin, re- start metformin 500 mg twice a day. Check a BMP

## 2013-09-12 NOTE — Assessment & Plan Note (Signed)
Well-controlled but has lower extremity edema likely from amlodipine. Plan: Discontinue amlodipine Carvedilol 6.25 twice a day, monitor BPs. See instructions

## 2013-09-13 LAB — BASIC METABOLIC PANEL
BUN: 18 mg/dL (ref 6–23)
CO2: 27 meq/L (ref 19–32)
Calcium: 9.1 mg/dL (ref 8.4–10.5)
Chloride: 102 mEq/L (ref 96–112)
Creatinine, Ser: 1.2 mg/dL (ref 0.4–1.5)
GFR: 72.64 mL/min (ref >60.00)
Glucose, Bld: 146 mg/dL — ABNORMAL HIGH (ref 70–99)
POTASSIUM: 4.7 meq/L (ref 3.5–5.1)
SODIUM: 137 meq/L (ref 135–145)

## 2013-09-13 LAB — LIPID PANEL
CHOL/HDL RATIO: 3
Cholesterol: 111 mg/dL (ref 0–200)
HDL: 43.5 mg/dL (ref >39.00)
LDL CALC: 60 mg/dL (ref 0–99)
TRIGLYCERIDES: 36 mg/dL (ref 0.0–149.0)
VLDL: 7.2 mg/dL (ref 0.0–40.0)

## 2013-09-13 LAB — ALT: ALT: 21 U/L (ref 0–53)

## 2013-09-13 LAB — AST: AST: 25 U/L (ref 0–37)

## 2013-09-14 ENCOUNTER — Other Ambulatory Visit: Payer: Self-pay | Admitting: *Deleted

## 2013-09-14 MED ORDER — GLUCOSE BLOOD VI STRP
ORAL_STRIP | Status: DC
Start: 2013-09-14 — End: 2014-03-14

## 2013-09-14 MED ORDER — CLOPIDOGREL BISULFATE 75 MG PO TABS: 75.0000 mg | ORAL_TABLET | Freq: Every day | ORAL | Status: DC

## 2013-09-14 MED ORDER — THIAMINE HCL 100 MG PO TABS: 100.0000 mg | ORAL_TABLET | Freq: Every day | ORAL | Status: DC

## 2013-09-16 ENCOUNTER — Telehealth: Payer: Self-pay | Admitting: *Deleted

## 2013-09-16 DIAGNOSIS — H409 Unspecified glaucoma: Secondary | ICD-10-CM

## 2013-09-16 DIAGNOSIS — R413 Other amnesia: Secondary | ICD-10-CM

## 2013-09-16 DIAGNOSIS — M1990 Unspecified osteoarthritis, unspecified site: Secondary | ICD-10-CM

## 2013-09-16 NOTE — Telephone Encounter (Signed)
Received fax from [facility] for Occupational Therapy plan of care. Billing sheet attached and placed in blue folder for Dr. Drue NovelPaz. JG//CMA

## 2013-09-21 ENCOUNTER — Other Ambulatory Visit: Payer: Self-pay | Admitting: *Deleted

## 2013-09-21 ENCOUNTER — Other Ambulatory Visit: Payer: Self-pay | Admitting: Internal Medicine

## 2013-09-21 MED ORDER — TAMSULOSIN HCL 0.4 MG PO CAPS: 0.4000 mg | ORAL_CAPSULE | Freq: Every day | ORAL | Status: DC

## 2013-09-21 MED ORDER — SAXAGLIPTIN HCL 5 MG PO TABS: 5.0000 mg | ORAL_TABLET | Freq: Every day | ORAL | Status: DC

## 2013-09-21 MED ORDER — METFORMIN HCL 500 MG PO TABS: 500.0000 mg | ORAL_TABLET | Freq: Two times a day (BID) | ORAL | Status: DC

## 2013-09-21 MED ORDER — CLOPIDOGREL BISULFATE 75 MG PO TABS: 75.0000 mg | ORAL_TABLET | Freq: Every day | ORAL | Status: DC

## 2013-09-21 MED ORDER — LEVOTHYROXINE SODIUM 25 MCG PO TABS: 25.0000 ug | ORAL_TABLET | Freq: Every day | ORAL | Status: DC

## 2013-09-21 MED ORDER — CARVEDILOL 6.25 MG PO TABS: 6.2500 mg | ORAL_TABLET | Freq: Two times a day (BID) | ORAL | Status: DC

## 2013-09-21 MED ORDER — MEMANTINE HCL 5 MG PO TABS: 5.0000 mg | ORAL_TABLET | Freq: Two times a day (BID) | ORAL | Status: DC

## 2013-09-21 MED ORDER — ATORVASTATIN CALCIUM 40 MG PO TABS: 40.0000 mg | ORAL_TABLET | Freq: Every day | ORAL | Status: DC

## 2013-09-21 NOTE — Telephone Encounter (Signed)
Received letter from patients wife requesting 90 day supplies of Coreg, Levothyroxine, Plavix, Namenda, Onglyza, Lipitor, Flomax and Metformin to be sent to [facility] as they do not have to pay a copay. This has been sone and wife was made aware.   [facility] did not have medication list updated, copy of office notes form the patient's last visit printed and [facility]ed to [facility] for their records.

## 2013-10-04 ENCOUNTER — Telehealth: Payer: Self-pay | Admitting: *Deleted

## 2013-10-04 ENCOUNTER — Encounter: Payer: Self-pay | Admitting: Neurology

## 2013-10-04 ENCOUNTER — Ambulatory Visit (INDEPENDENT_AMBULATORY_CARE_PROVIDER_SITE_OTHER): Payer: Medicare Other | Admitting: Neurology

## 2013-10-04 VITALS — BP 126/70 | HR 76 | Temp 98.0°F | Resp 16 | Ht 65.0 in | Wt 164.4 lb

## 2013-10-04 DIAGNOSIS — F0150 Vascular dementia without behavioral disturbance: Secondary | ICD-10-CM

## 2013-10-04 DIAGNOSIS — G309 Alzheimer's disease, unspecified: Secondary | ICD-10-CM

## 2013-10-04 DIAGNOSIS — I6350 Cerebral infarction due to unspecified occlusion or stenosis of unspecified cerebral artery: Secondary | ICD-10-CM

## 2013-10-04 DIAGNOSIS — F0280 Dementia in other diseases classified elsewhere without behavioral disturbance: Secondary | ICD-10-CM

## 2013-10-04 DIAGNOSIS — I639 Cerebral infarction, unspecified: Secondary | ICD-10-CM

## 2013-10-04 MED ORDER — MEMANTINE HCL 10 MG PO TABS: 10.0000 mg | ORAL_TABLET | Freq: Two times a day (BID) | ORAL | Status: DC

## 2013-10-04 NOTE — Telephone Encounter (Signed)
Received care plan via fax [facility]. Billing sheet attached and placed in blue folder for Dr. Drue NovelPaz. JG//CMA

## 2013-10-04 NOTE — Patient Instructions (Addendum)
I think Christian Erickson has a mixed dementia (Alzheimer's and vascular dementia).  1.  We will continue aspirin and Plavix.  On 10/21/13, the aspirin may be discontinued and he can remain on Plavix 75mg  daily. 2.  We will increase the Namenda.  He will take 5mg  twice daily for 7 days, then 10mg  twice daily. 3.  Continue Lipitor 40mg  daily (LDL goal less than 70). 4.  Continue being social and interactive with others.  Exercise regularly.  Read, play brainteasers and crossword puzzles daily. 5.  Follow up in 6 months or as needed.

## 2013-10-04 NOTE — Progress Notes (Signed)
NEUROLOGY CONSULTATION NOTE  Christian Erickson MRN: [id number] DOB: July 02, 1931  Referring provider: Dr. Drue Novel Primary care provider: Dr. Drue Novel  Reason for consult:  Stroke and dementia  HISTORY OF PRESENT ILLNESS: Christian Erickson is an 80 year old right-handed man with history of alcohol abuse, type II diabetes, hypertension, dyslipidemia and dementia who presents for recent stroke and dementia.  He is accompanied by his wife.  Records and images were personally reviewed where available.    His wife first noticed cognitive problems approximately 5 years ago. At first, he would leave doors open, such as the house, car, and freezer. He would often loses items as well. Sometimes he would leave the stove on. He was still driving and was still able to participate in the household finances. He was diagnosed with dementia approximately one and a half years ago. However, he refuses to start any medications such as Aricept.  He was admitted to [facility] on 07/23/13 after he was found on the floor by his wife after she heard a thud.  He was awake but not following commands.  He was found to have left-sided gaze, slurred speech and right-sided weakness.  Systolic blood pressure was in the 190s.  CBG was 131.  CT of head revealed mild diffuse atrophy with compensatory ventriculomegaly but no acute intracranial abnormality.  MRI of the brain without contrast revealed small acute infarcts involving the left caudate/corona radiate and left thalamus.  CTA of the head and neck revealed moderate to high-grade calcific stenosis within the cavernous carotid arteries bilaterally.  There is also dense atherosclerotic calcifications at the carotid bifurcations bilaterally without definite stenosis on either side. 2. Tortuosity of the distal right ICA and medial deviation of the proximal left ICA, 1-39% right ICA stenosis and 40-59% left ICA stenosis.  2D echo revealed LVEF 55-60% and grade 1 diastolic dysfunction with no source of  emboli.  Hgb A1c was 6.9.  LDL was 98.  B12 was 412.  Ultimately he was started on ASA and Plavix.  Namenda 5mg  twice daily was initiated.  Lipitor was increased from 20mg  to 40mg .  He was also started on thiamine.  He went to inpatient rehabilitation for approximately one month. Now he is living at [facility].  There has been a significant progression in his cognitive deficits since the stroke. At first, he had trouble recognizing familiar people but this has since improved. However, he now is unable to participate in household finances or filing taxes. He is more irritable and easily agitated. 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 Since the stroke, he exhibits delusions of still participating in [employer] life. Sometimes he thinks he is going on missions. He is able to perform his activities of daily living. He bathes and dresses himself. However, he will sometimes shoes to wear pajamas even outside the house. He wears pull-ups and is able to clean himself. For many years he had trouble with insomnia. He would sleep during the day and be up all night. He is to take Ambien, however he has been doing well and hasn't needed the Ambien since moving into the assisted living.  Sometimes when his wife visits him, he is sitting in the dark watching TV. The facility does a good job in trying to keep him active and socialize. All meals are served in the dining room. There are many activities to participate in during the day. He participates  in physical therapy and occupational therapy. Of course he is no longer driving. A lot of his friends and family visits him. He does not feel or exhibit signs of depression. It should be noted that his sister had Alzheimer's disease. As far as physical deficits from the stroke, such as right-sided weakness, this has resolved.  PAST MEDICAL HISTORY: Past Medical History  Diagnosis Date  . BPH (benign prostatic  hypertrophy)   . Diabetes mellitus   . Hypertension   . Hyperlipidemia   . Diverticulitis   . ED (erectile dysfunction)   . Glaucoma     sees eye doctor routinely  . Allergic rhinitis   . Subclinical hypothyroidism   . Arthritis   . Blood dyscrasia     PAST SURGICAL HISTORY: Past Surgical History  Procedure Laterality Date  . Inguinal hernia repair    . Colectomy      w/ reversal due to divertiuclitis per patient in 1990s aprox  . Sbo repair    . Total knee arthroplasty Right 10/27/2012    Procedure: TOTAL KNEE ARTHROPLASTY;  Surgeon: Loreta Ave, MD;  Location: [facility] OR;  Service: Orthopedics;  Laterality: Right;    MEDICATIONS: Current Outpatient Prescriptions on File Prior to Visit  Medication Sig Dispense Refill  . acetaminophen (TYLENOL) 500 MG tablet Take 1,000 mg by mouth every 6 (six) hours as needed (pain).      [name] aspirin EC 81 MG tablet Take 81 mg by mouth daily.      [name] atorvastatin (LIPITOR) 40 MG tablet Take 1 tablet (40 mg total) by mouth daily at 6 PM.  90 tablet  3  . bimatoprost (LUMIGAN) 0.03 % ophthalmic solution Place 1 drop into both eyes at bedtime.      . carvedilol (COREG) 6.25 MG tablet Take 1 tablet (6.25 mg total) by mouth 2 (two) times daily with a meal.  180 tablet  3  . clopidogrel (PLAVIX) 75 MG tablet Take 1 tablet (75 mg total) by mouth daily with breakfast.  90 tablet  3  . fluticasone (FLONASE) 50 MCG/ACT nasal spray Place 2 sprays into both nostrils daily as needed for allergies or rhinitis.      . folic acid (FOLVITE) 1 MG tablet Take 1 tablet (1 mg total) by mouth daily.      [name] glucose blood (ONE TOUCH ULTRA TEST) test strip Use as instructed  100 each  12  . levothyroxine (SYNTHROID, LEVOTHROID) 25 MCG tablet Take 1 tablet (25 mcg total) by mouth daily before breakfast.  30 tablet  0  . levothyroxine (SYNTHROID, LEVOTHROID) 25 MCG tablet Take 1 tablet (25 mcg total) by mouth daily before breakfast.  90 tablet  2  . metFORMIN (GLUCOPHAGE)  500 MG tablet Take 1 tablet (500 mg total) by mouth 2 (two) times daily with a meal.  180 tablet  3  . Multiple Vitamin (MULTIVITAMIN WITH MINERALS) TABS tablet Take 1 tablet by mouth daily.      . saxagliptin HCl (ONGLYZA) 5 MG TABS tablet Take 1 tablet (5 mg total) by mouth daily.  90 tablet  3  . senna-docusate (SENOKOT-S) 8.6-50 MG per tablet Take 1 tablet by mouth at bedtime as needed for moderate constipation.      . tamsulosin (FLOMAX) 0.4 MG CAPS capsule Take 1 capsule (0.4 mg total) by mouth daily.  90 capsule  3  . thiamine 100 MG tablet Take 1 tablet (100 mg total) by mouth daily.  30 tablet  6  .  zolpidem (AMBIEN) 5 MG tablet Take 1 tablet (5 mg total) by mouth at bedtime as needed for sleep.  30 tablet  1  . [DISCONTINUED] cloNIDine (CATAPRES) 0.2 MG tablet Take 0.2 mg by mouth 2 (two) times daily.       No current facility-administered medications on file prior to visit.    ALLERGIES: Allergies  Allergen Reactions  . Beef-Derived Products Swelling  . Pork-Derived Products Swelling  . Vasotec     Angioedema, see OV 09-05-11    FAMILY HISTORY: Family History  Problem Relation Age of Onset  . Lupus Sister   . Stroke Brother [id number]    CVA, no FH premature CAD  . Diabetes Other     granddaughter  . Colon cancer Neg Hx   . Prostate cancer Other     cousin, age 78  . CAD Mother     "enlarged heart"    SOCIAL HISTORY: History   Social History  . Marital Status: Married    Spouse Name: N/A    Number of Children: 2  . Years of Education: N/A   Occupational History  . Retired-- Hotel managermilitary, [employer]    Social History Main Topics  . Smoking status: Former Smoker    Types: Cigarettes  . Smokeless tobacco: Never Used     Comment: used to smoke rarely  . Alcohol Use: Yes     Comment: rarely  . Drug Use: No  . Sexual Activity: Not on file   Other Topics Concern  . Not on file   Social History Narrative   Wife had CABG 2011   Married x > 44 years, children x 2  , 2 G daughters   Lives at the memory unit ([facility] as of 09-2013)             REVIEW OF SYSTEMS: Constitutional: No fevers, chills, or sweats, no generalized fatigue, change in appetite Eyes: No visual changes, double vision, eye pain Ear, nose and throat: No hearing loss, ear pain, nasal congestion, sore throat Cardiovascular: No chest pain, palpitations Respiratory:  No shortness of breath at rest or with exertion, wheezes GastrointestinaI: No nausea, vomiting, diarrhea, abdominal pain Genitourinary:  incontinence Musculoskeletal:  No neck pain, back pain Integumentary: No rash, pruritus, skin lesions Neurological: as above Psychiatric: No depression, insomnia, anxiety Endocrine: No palpitations, fatigue, diaphoresis, mood swings, change in appetite, change in weight, increased thirst Hematologic/Lymphatic:  No anemia, purpura, petechiae. Allergic/Immunologic: no itchy/runny eyes, nasal congestion, recent allergic reactions, rashes  PHYSICAL EXAM: Filed Vitals:   10/04/13 1348  BP: 126/70  Pulse: 76  Temp: 98 F (36.7 C)  Resp: 16   General: No acute distress Head:  Normocephalic/atraumatic Neck: supple, no paraspinal tenderness, full range of motion Back: No paraspinal tenderness Heart: regular rate and rhythm Lungs: Clear to auscultation bilaterally. Vascular: No carotid bruits. Neurological Exam: Mental status: alert and oriented to really only to self.  [place]  He thought the year was 2004 and did not know the day of the week.  He was unable to correctly complete the Trail Making test.  He did not completely copy the cube correctly.  When drawing a clock, he did not place all the numbers correctly and was unable to draw the hands to the correct time.  Naming was okay (missed 1 of 3), and he was able to repeat.  Naming fluency was intact.  He had some trouble with abstract thinking.  He was only  able to perform the first of serial 7  subtractions.  Unable to recall 5 of 5 words after 5 minutes. Fund of knowledge was limited. Attention and concentration was good.  MOCA 12/30,   speech fluent and not dysarthric. Cranial nerves: CN I: not tested CN II: pupils equal, round and reactive to light, visual fields intact, fundi unremarkable, without vessel changes, exudates, hemorrhages or papilledema. CN III, IV, VI:  full range of motion, no nystagmus, no ptosis CN V: facial sensation intact CN VII: upper and lower face symmetric CN VIII: hearing intact CN IX, X: gag intact, uvula midline CN XI: sternocleidomastoid and trapezius muscles intact CN XII: tongue midline Bulk & Tone: normal, no fasciculations. Motor: 5/5 throughout Sensation: temperature and vibration intact Deep Tendon Reflexes: 2+ throughout, toes down Finger to nose testing: no dysmetria Gait: normal station and stride.  Some difficulty with tandem. Romberg negative.  IMPRESSION: 1.  Lacunar infarcts involving the left thalamus and left caudate/corona radiata, likely due to small vessel disease 2.  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  As per medication list, he is taking Namenda 5mg  daily, although he was supposed to be taking it twice daily on hospital discharge.  Increase to 5mg  twice daily for 7 days, then 10mg  twice daily. 2.  Continue ASA and Plavix together until 10/21/13.  At this time, the ASA can be discontinued and to remain on Plavix. 3.  Continue statin (LDL goal should be less than 70). 4.  PT/OT, brainteasers, puzzles, social interaction. 5.  Follow up in 6 months or as needed.  60 minutes spent with patient and wife, over 50% spent counseling and coordinating care.  Thank you for allowing me to take part in the care of this patient.  Shon Millet, DO  CC:  Willow Ora, MD

## 2013-10-06 ENCOUNTER — Ambulatory Visit: Payer: Medicare Other | Admitting: Internal Medicine

## 2013-10-10 ENCOUNTER — Other Ambulatory Visit: Payer: Self-pay | Admitting: *Deleted

## 2013-10-10 MED ORDER — MEMANTINE HCL 10 MG PO TABS: 10.0000 mg | ORAL_TABLET | Freq: Two times a day (BID) | ORAL | Status: DC

## 2013-10-10 NOTE — Telephone Encounter (Signed)
Namenda 10 mg # [facility]

## 2013-10-13 ENCOUNTER — Other Ambulatory Visit: Payer: Self-pay | Admitting: Internal Medicine

## 2013-10-21 ENCOUNTER — Telehealth: Payer: Self-pay | Admitting: *Deleted

## 2013-10-21 MED ORDER — ZOLPIDEM TARTRATE 5 MG PO TABS: 5.0000 mg | ORAL_TABLET | Freq: Every evening | ORAL | Status: DC | PRN

## 2013-10-21 NOTE — Telephone Encounter (Signed)
rx refill- ambien 5mg  Last OV- 09/12/13 Last refilled- 09/02/13 #30 / 1 rf

## 2013-10-21 NOTE — Telephone Encounter (Signed)
Done

## 2013-10-21 NOTE — Telephone Encounter (Signed)
[facility]

## 2013-10-24 ENCOUNTER — Other Ambulatory Visit: Payer: Self-pay | Admitting: *Deleted

## 2013-10-24 MED ORDER — LEVOTHYROXINE SODIUM 25 MCG PO TABS: 25.0000 ug | ORAL_TABLET | Freq: Every day | ORAL | Status: DC

## 2013-11-14 ENCOUNTER — Encounter: Payer: Self-pay | Admitting: Internal Medicine

## 2013-11-14 ENCOUNTER — Ambulatory Visit (INDEPENDENT_AMBULATORY_CARE_PROVIDER_SITE_OTHER): Payer: Medicare Other | Admitting: Internal Medicine

## 2013-11-14 VITALS — BP 190/76 | HR 63 | Temp 98.2°F | Wt 168.8 lb

## 2013-11-14 DIAGNOSIS — I639 Cerebral infarction, unspecified: Secondary | ICD-10-CM

## 2013-11-14 DIAGNOSIS — G309 Alzheimer's disease, unspecified: Secondary | ICD-10-CM

## 2013-11-14 DIAGNOSIS — R946 Abnormal results of thyroid function studies: Secondary | ICD-10-CM

## 2013-11-14 DIAGNOSIS — F0280 Dementia in other diseases classified elsewhere without behavioral disturbance: Secondary | ICD-10-CM

## 2013-11-14 DIAGNOSIS — F0150 Vascular dementia without behavioral disturbance: Secondary | ICD-10-CM

## 2013-11-14 DIAGNOSIS — E119 Type 2 diabetes mellitus without complications: Secondary | ICD-10-CM

## 2013-11-14 DIAGNOSIS — I6350 Cerebral infarction due to unspecified occlusion or stenosis of unspecified cerebral artery: Secondary | ICD-10-CM

## 2013-11-14 DIAGNOSIS — I10 Essential (primary) hypertension: Secondary | ICD-10-CM

## 2013-11-14 DIAGNOSIS — R7989 Other specified abnormal findings of blood chemistry: Secondary | ICD-10-CM

## 2013-11-14 NOTE — Progress Notes (Signed)
Pre visit review using our clinic review tool, if applicable. No additional management support is needed unless otherwise documented below in the visit note. 

## 2013-11-14 NOTE — Assessment & Plan Note (Signed)
stable °

## 2013-11-14 NOTE — Assessment & Plan Note (Signed)
BP slightly elevated today but is checked 3 times a week at home and is 140/80. Edema decreased after amlodipine was discontinued. Plan: Continue taking BPs, if BP elevated consider increase carvedilol

## 2013-11-14 NOTE — Progress Notes (Signed)
Subjective:    Patient ID: Christian Erickson, male    DOB: [birth year], 80 y.o.   MRN: [id number]  DOS:  11/14/2013 Type of  visit: ROV. Here with his wife Hypertension, good medication compliance, BP today slightly elevated but is better at [facility] were he lives. Stroke, and decreased memory. Neurology note reviewed, Namenda was increased; was recommend to stop aspirin on April. Diabetes, ambulatory CBGs 90, 119, 168. Had a rash in the right wrist for few days, better? Patient not sure.    ROS Information collected from the patient and his wife. No fever or chills No chest pain or difficulty breathing No nausea, vomiting, diarrhea. Patient's wife reported memory is about the same, slightly worse?[name]. He does not seem to be anxious or depressed, he is definitely more quiet than before but still likes to socialize and talk w/ wife and  other residents.   Past Medical History  Diagnosis Date  . BPH (benign prostatic hypertrophy)   . Diabetes mellitus   . Hypertension   . Hyperlipidemia   . Diverticulitis   . ED (erectile dysfunction)   . Glaucoma     sees eye doctor routinely  . Allergic rhinitis   . Subclinical hypothyroidism   . Arthritis   . Blood dyscrasia     Past Surgical History  Procedure Laterality Date  . Inguinal hernia repair    . Colectomy      w/ reversal due to divertiuclitis per patient in 1990s aprox  . Sbo repair    . Total knee arthroplasty Right 10/27/2012    Procedure: TOTAL KNEE ARTHROPLASTY;  Surgeon: Loreta Aveaniel F Murphy, MD;  [facility];  Service: Orthopedics;  Laterality: Right;    History   Social History  . Marital Status: Married    Spouse Name: N/A    Number of Children: 2  . Years of Education: N/A   Occupational History  . Retired-- Hotel managermilitary, [employer]    Social History Main Topics  . Smoking status: Former Smoker    Types: Cigarettes  . Smokeless tobacco: Never Used     Comment: used to smoke rarely  . Alcohol Use: Yes    Comment: rarely  . Drug Use: No  . Sexual Activity: Not on file   Other Topics Concern  . Not on file   Social History Narrative   Wife had CABG 2011   Married x > 44 years, children x 2 , 2 G daughters   Lives at the memory unit ([facility] as of 09-2013)                 Medication List       This list is accurate as of: 11/14/13 11:59 PM.  Always use your most recent med list.               acetaminophen 500 MG tablet  Commonly known as:  TYLENOL  Take 1,000 mg by mouth every 6 (six) hours as needed (pain).     atorvastatin 40 MG tablet  Commonly known as:  LIPITOR  Take 1 tablet (40 mg total) by mouth daily at 6 PM.     BAYER MICROLET LANCETS lancets  CHECK ONCE DAILY     bimatoprost 0.03 % ophthalmic solution  Commonly known as:  LUMIGAN  Place 1 drop into both eyes at bedtime.     carvedilol 6.25 MG tablet  Commonly known as:  COREG  Take 1 tablet (6.25 mg total) by mouth 2 (  two) times daily with a meal.     clopidogrel 75 MG tablet  Commonly known as:  PLAVIX  Take 1 tablet (75 mg total) by mouth daily with breakfast.     fluticasone 50 MCG/ACT nasal spray  Commonly known as:  FLONASE  Place 2 sprays into both nostrils daily as needed for allergies or rhinitis.     folic acid 1 MG tablet  Commonly known as:  FOLVITE  Take 1 tablet (1 mg total) by mouth daily.     glucose blood test strip  Commonly known as:  ONE TOUCH ULTRA TEST  Use as instructed     levothyroxine 25 MCG tablet  Commonly known as:  SYNTHROID, LEVOTHROID  Take 1 tablet (25 mcg total) by mouth daily before breakfast.     memantine 10 MG tablet  Commonly known as:  NAMENDA  Take 1 tablet (10 mg total) by mouth 2 (two) times daily.     metFORMIN 500 MG tablet  Commonly known as:  GLUCOPHAGE  Take 1 tablet (500 mg total) by mouth 2 (two) times daily with a meal.     multivitamin with minerals Tabs tablet  Take 1 tablet by mouth daily.     saxagliptin HCl 5 MG Tabs tablet   Commonly known as:  ONGLYZA  Take 1 tablet (5 mg total) by mouth daily.     senna-docusate 8.6-50 MG per tablet  Commonly known as:  Senokot-S  Take 1 tablet by mouth at bedtime as needed for moderate constipation.     tamsulosin 0.4 MG Caps capsule  Commonly known as:  FLOMAX  Take 1 capsule (0.4 mg total) by mouth daily.     thiamine 100 MG tablet  Take 1 tablet (100 mg total) by mouth daily.     zolpidem 5 MG tablet  Commonly known as:  AMBIEN  Take 1 tablet (5 mg total) by mouth at bedtime as needed for sleep.           Objective:   Physical Exam BP 190/76  Pulse 63  Temp(Src) 98.2 F (36.8 C) (Oral)  Wt 168 lb 12.8 oz (76.567 kg)  SpO2 100% General -- alert, well-developed, NAD.   Lungs -- normal respiratory effort, no intercostal retractions, no accessory muscle use, and normal breath sounds.  Heart-- normal rate, regular rhythm, no murmur.    Extremities-- +/+++ pretibial edema bilaterally  Neurologic--  alert , pleasent, cooperative, repeat same questions many times  Psych--  No anxious or depressed appearing.     Assessment & Plan:   Rash, Rash at the right wrist, recommend OTC hydrocortisone, wife to monitor  for signs of infection

## 2013-11-14 NOTE — Assessment & Plan Note (Addendum)
Note from neurology reviewed, will discontinue aspirin per neuro advise, continue with Plavix

## 2013-11-14 NOTE — Patient Instructions (Signed)
Get your blood work before you leave   Next visit is for routine check up in 3 months   Instructions for [facility] Stop aspirin Hydrocortisone 1% OTC to apply twice a day to the right wrist for one week, then stop; call if his signs or symptoms of infection Check blood sugars twice a day, call if they're more than 180 consistently Continue checking ambulatory blood pressures 3 times a week, call if they are consistently more than 145/85 Next visit to see me in 3 months

## 2013-11-14 NOTE — Assessment & Plan Note (Signed)
Due for a TSH 

## 2013-11-14 NOTE — Assessment & Plan Note (Addendum)
Due for a A1c, no change, decrease CBG check from 3 times a day to twice a day

## 2013-11-27 ENCOUNTER — Telehealth: Payer: Self-pay | Admitting: Internal Medicine

## 2013-11-27 NOTE — Telephone Encounter (Signed)
Labs were order at the last visit, he did not have them drawn, asked pt  to come back for labs

## 2013-11-29 NOTE — Telephone Encounter (Signed)
Pts wife states will call back to schedule .

## 2013-12-02 ENCOUNTER — Other Ambulatory Visit: Payer: Self-pay | Admitting: *Deleted

## 2013-12-02 DIAGNOSIS — T7840XA Allergy, unspecified, initial encounter: Secondary | ICD-10-CM

## 2013-12-02 MED ORDER — EPINEPHRINE 0.3 MG/0.3ML IJ SOAJ: 0.3000 mg | Freq: Once | INTRAMUSCULAR | Status: DC

## 2013-12-02 NOTE — Telephone Encounter (Signed)
Refill for Epi Pen sent to [facility]

## 2013-12-05 ENCOUNTER — Other Ambulatory Visit: Payer: Self-pay | Admitting: Internal Medicine

## 2013-12-09 ENCOUNTER — Other Ambulatory Visit: Payer: Self-pay | Admitting: Internal Medicine

## 2014-01-17 ENCOUNTER — Other Ambulatory Visit: Payer: Self-pay | Admitting: *Deleted

## 2014-01-20 ENCOUNTER — Telehealth: Payer: Self-pay | Admitting: *Deleted

## 2014-01-20 NOTE — Telephone Encounter (Signed)
Received Orders form via fax from [facility].  Placed in folder for Dr. Drue NovelPaz to review and sign.//AB/CMA

## 2014-01-30 NOTE — Telephone Encounter (Signed)
Dr. Drue NovelPaz reviewed and signed orders.  Orders faxed back to [facility].  Confirmation received.  Order labeled and placed in basket for scanning.

## 2014-02-06 ENCOUNTER — Other Ambulatory Visit: Payer: Self-pay | Admitting: Internal Medicine

## 2014-02-07 ENCOUNTER — Other Ambulatory Visit: Payer: Self-pay | Admitting: Internal Medicine

## 2014-02-08 MED ORDER — ZOLPIDEM TARTRATE 5 MG PO TABS: 2.5000 mg | ORAL_TABLET | Freq: Every evening | ORAL | Status: DC | PRN

## 2014-02-10 ENCOUNTER — Telehealth: Payer: Self-pay | Admitting: *Deleted

## 2014-02-10 NOTE — Telephone Encounter (Signed)
Received signed Consultation Report from Dr. Drue NovelPaz.  [facility] at [phone]).//AB/CMA

## 2014-02-14 ENCOUNTER — Ambulatory Visit: Payer: Medicare Other | Admitting: Internal Medicine

## 2014-02-17 ENCOUNTER — Telehealth: Payer: Self-pay

## 2014-02-17 NOTE — Telephone Encounter (Signed)
Caller name:[name] Relation to UE:AVWUJWpt:Spouse Call back [phone] until 11 am then cell [phone] Pharmacy:  Reason for call: Mrs. Chestine SporeClark called concerned about Jomarie LongsJoseph, [facility] blood pressure and it was  R Arm 204/96 L Arm 197/112 45 minutes later it was 172/92  She also stated that they did not [facility]

## 2014-02-17 NOTE — Telephone Encounter (Signed)
Faxed instructions [facility]Paz [facility], [facility] and [facility] for her to return my call regarding her husbands BP.

## 2014-02-17 NOTE — Telephone Encounter (Signed)
Please call [facility] w/ the followoing orders: -check his BP daily and call with a log in one week - Increase carvedilol 6.25 mg to 2 tablets twice a day Please notify the patient's wife  that we called the memory unit

## 2014-02-20 ENCOUNTER — Telehealth: Payer: Self-pay

## 2014-02-20 NOTE — Telephone Encounter (Signed)
[facility] [place] [facility] about [facility] having multiple loose stools, he currently has no medication for PRN loose stools.    Please Advise.

## 2014-02-20 NOTE — Telephone Encounter (Signed)
If fever, chills, stomach pain, blood in the stools or mucus in the stools, decrease oral intake: Needs to be seen here or at the urgent care. Otherwise Imodium 4 mg 1 po tid prn  Call if no better in 2-3 days

## 2014-02-21 ENCOUNTER — Telehealth: Payer: Self-pay | Admitting: *Deleted

## 2014-02-21 NOTE — Telephone Encounter (Signed)
Received signed Physician Orders form from Dr. Drue NovelPaz.   [facility] at [phone]).//AB/CMA

## 2014-02-21 NOTE — Telephone Encounter (Signed)
Faxed instructions back to facility.

## 2014-02-21 NOTE — Telephone Encounter (Signed)
Received signed Physician Order Sheet forms from Dr. Drue NovelPaz.   [facility] at ([phone]).//AB/CMA

## 2014-02-23 ENCOUNTER — Telehealth: Payer: Self-pay | Admitting: *Deleted

## 2014-02-23 NOTE — Telephone Encounter (Signed)
Received Physician Order Sheet and FL-2 Form [facility].  Billing sheet attached to FL-2  Form,and both forms placed in folder for Dr. Drue NovelPaz to review and sign.//AB/CMA

## 2014-02-27 ENCOUNTER — Encounter: Payer: Self-pay | Admitting: Internal Medicine

## 2014-02-27 ENCOUNTER — Ambulatory Visit (INDEPENDENT_AMBULATORY_CARE_PROVIDER_SITE_OTHER): Payer: Medicare Other | Admitting: Internal Medicine

## 2014-02-27 VITALS — BP 132/84 | HR 56 | Temp 98.3°F | Wt 166.5 lb

## 2014-02-27 DIAGNOSIS — R7989 Other specified abnormal findings of blood chemistry: Secondary | ICD-10-CM

## 2014-02-27 DIAGNOSIS — E119 Type 2 diabetes mellitus without complications: Secondary | ICD-10-CM

## 2014-02-27 DIAGNOSIS — I6350 Cerebral infarction due to unspecified occlusion or stenosis of unspecified cerebral artery: Secondary | ICD-10-CM

## 2014-02-27 DIAGNOSIS — F0280 Dementia in other diseases classified elsewhere without behavioral disturbance: Secondary | ICD-10-CM

## 2014-02-27 DIAGNOSIS — F0150 Vascular dementia without behavioral disturbance: Secondary | ICD-10-CM

## 2014-02-27 DIAGNOSIS — G309 Alzheimer's disease, unspecified: Secondary | ICD-10-CM

## 2014-02-27 DIAGNOSIS — I10 Essential (primary) hypertension: Secondary | ICD-10-CM

## 2014-02-27 DIAGNOSIS — E039 Hypothyroidism, unspecified: Secondary | ICD-10-CM

## 2014-02-27 LAB — BASIC METABOLIC PANEL
BUN: 18 mg/dL (ref 6–23)
CALCIUM: 9.2 mg/dL (ref 8.4–10.5)
CO2: 32 meq/L (ref 19–32)
Chloride: 100 mEq/L (ref 96–112)
Creatinine, Ser: 1.1 mg/dL (ref 0.4–1.5)
GFR: 82.54 mL/min (ref >60.00)
GLUCOSE: 96 mg/dL (ref 70–99)
Potassium: 4 mEq/L (ref 3.5–5.1)
Sodium: 138 mEq/L (ref 135–145)

## 2014-02-27 LAB — HEMOGLOBIN A1C: Hgb A1c MFr Bld: 7.5 % — ABNORMAL HIGH (ref 4.6–6.5)

## 2014-02-27 LAB — TSH: TSH: 2.2 u[IU]/mL (ref 0.35–4.50)

## 2014-02-27 NOTE — Assessment & Plan Note (Signed)
Good compliance of medication, will check labs

## 2014-02-27 NOTE — Assessment & Plan Note (Signed)
Seems stable at this time °

## 2014-02-27 NOTE — Patient Instructions (Signed)
Get your blood work before you leave   Next visit is for routine check up regards your blood sugar , blood pressure in 4 months  

## 2014-02-27 NOTE — Progress Notes (Signed)
Pre-visit discussion using our clinic review tool. No additional management support is needed unless otherwise documented below in the visit note.  

## 2014-02-27 NOTE — Assessment & Plan Note (Signed)
tsh due

## 2014-02-27 NOTE — Progress Notes (Signed)
Subjective:    Patient ID: Christian Erickson, male    DOB: Nov 07, 1930, 80 y.o.   MRN: [id number]  DOS:  02/27/2014 Type of visit - description: roiutine here w/ wife, has the [facility] from [facility] which is reviewed  History: DM-- good med compliance, no amb CBGs HTN-- BP recently elevated, coreg was added, amb BPs (recent ) range from 120-190 /80. Mostly < 160.  insomnia-- control ok w/ ambien per pt's wife Recently had  diarrhea, wife state is better    ROS No F/C No N-V-blood in stools  No anxiety-depression or agitation but doesn't participate much in group activities   Past Medical History  Diagnosis Date  . BPH (benign prostatic hypertrophy)   . Diabetes mellitus   . Hypertension   . Hyperlipidemia   . Diverticulitis   . ED (erectile dysfunction)   . Glaucoma     sees eye doctor routinely  . Allergic rhinitis   . Subclinical hypothyroidism   . Arthritis   . Blood dyscrasia     Past Surgical History  Procedure Laterality Date  . Inguinal hernia repair    . Colectomy      w/ reversal due to divertiuclitis per patient in 1990s aprox  . Sbo repair    . Total knee arthroplasty Right 10/27/2012    Procedure: TOTAL KNEE ARTHROPLASTY;  Surgeon: Loreta Ave, MD;  [facility];  Service: Orthopedics;  Laterality: Right;    History   Social History  . Marital Status: Married    Spouse Name: N/A    Number of Children: 2  . Years of Education: N/A   Occupational History  . Retired-- Hotel manager, [employer]    Social History Main Topics  . Smoking status: Former Smoker    Types: Cigarettes  . Smokeless tobacco: Never Used     Comment: used to smoke rarely  . Alcohol Use: Yes     Comment: rarely  . Drug Use: No  . Sexual Activity: Not on file   Other Topics Concern  . Not on file   Social History Narrative   Wife had CABG 2011   Married x > 44 years, children x 2 , 2 G daughters   Lives at the memory unit ([facility] as of 09-2013)                 Medication List       This list is accurate as of: 02/27/14 11:59 PM.  Always use your most recent med list.               acetaminophen 500 MG tablet  Commonly known as:  TYLENOL  Take 1,000 mg by mouth every 6 (six) hours as needed (pain).     atorvastatin 40 MG tablet  Commonly known as:  LIPITOR  Take 1 tablet (40 mg total) by mouth daily at 6 PM.     BAYER MICROLET LANCETS lancets  CHECK ONCE DAILY     bimatoprost 0.03 % ophthalmic solution  Commonly known as:  LUMIGAN  Place 1 drop into both eyes at bedtime.     carvedilol 6.25 MG tablet  Commonly known as:  COREG  take 1 tablet by mouth twice a day with meals     clopidogrel 75 MG tablet  Commonly known as:  PLAVIX  Take 1 tablet (75 mg total) by mouth daily with breakfast.     EPIPEN 2-PAK 0.3 mg/0.3 mL Soaj injection  Generic drug:  EPINEPHrine  use as directed FOR LIFE-THREATENING ALLERGIC REACTIONS     fluticasone 50 MCG/ACT nasal spray  Commonly known as:  FLONASE  Place 2 sprays into both nostrils daily as needed for allergies or rhinitis.     folic acid 1 MG tablet  Commonly known as:  FOLVITE  Take 1 tablet (1 mg total) by mouth daily.     glucose blood test strip  Commonly known as:  ONE TOUCH ULTRA TEST  Use as instructed     levothyroxine 25 MCG tablet  Commonly known as:  SYNTHROID, LEVOTHROID  Take 1 tablet (25 mcg total) by mouth daily before breakfast.     memantine 10 MG tablet  Commonly known as:  NAMENDA  Take 1 tablet (10 mg total) by mouth 2 (two) times daily.     metFORMIN 500 MG tablet  Commonly known as:  GLUCOPHAGE  Take 1 tablet (500 mg total) by mouth 2 (two) times daily with a meal.     multivitamin with minerals Tabs tablet  Take 1 tablet by mouth daily.     saxagliptin HCl 5 MG Tabs tablet  Commonly known as:  ONGLYZA  Take 1 tablet (5 mg total) by mouth daily.     senna-docusate 8.6-50 MG per tablet  Commonly known as:  Senokot-S  Take 1 tablet by mouth at  bedtime as needed for moderate constipation.     tamsulosin 0.4 MG Caps capsule  Commonly known as:  FLOMAX  Take 1 capsule (0.4 mg total) by mouth daily.     thiamine 100 MG tablet  Take 1 tablet (100 mg total) by mouth daily.     zolpidem 5 MG tablet  Commonly known as:  AMBIEN  Take 2.5-5 mg by mouth at bedtime as needed for sleep.           Objective:   Physical Exam BP 132/84  Pulse 56  Temp(Src) 98.3 F (36.8 C) (Oral)  Wt 166 lb 8 oz (75.524 kg)  SpO2 98%  General -- alert, well-developed, pleasant, NAD.  Lungs -- normal respiratory effort, no intercostal retractions, no accessory muscle use, and normal breath sounds.  Heart-- normal rate, regular rhythm, no murmur.   Extremities-- no pretibial edema bilaterally  Neurologic--  alert & oriented X to self, not oriented in time or place, recognizes wife and me   Psych--   No anxious or depressed appearing.       Assessment & Plan:

## 2014-02-27 NOTE — Assessment & Plan Note (Signed)
BP slt elevated at home but ok here. For now recommend continue observation, I asked for BP checks 3 times a week and call if they are consistently more than 145/85.

## 2014-02-28 DIAGNOSIS — I69959 Hemiplegia and hemiparesis following unspecified cerebrovascular disease affecting unspecified side: Secondary | ICD-10-CM

## 2014-02-28 DIAGNOSIS — F0390 Unspecified dementia without behavioral disturbance: Secondary | ICD-10-CM

## 2014-02-28 DIAGNOSIS — I69922 Dysarthria following unspecified cerebrovascular disease: Secondary | ICD-10-CM

## 2014-02-28 DIAGNOSIS — I6350 Cerebral infarction due to unspecified occlusion or stenosis of unspecified cerebral artery: Secondary | ICD-10-CM

## 2014-02-28 DIAGNOSIS — I69998 Other sequelae following unspecified cerebrovascular disease: Secondary | ICD-10-CM

## 2014-03-01 NOTE — Telephone Encounter (Addendum)
Received signed Physician Order Sheet and FL-2 form from Dr. Drue Novel.  All forms faxed to [facility] at [phone]).  Confirmation received.//AB/CMA

## 2014-03-09 ENCOUNTER — Other Ambulatory Visit: Payer: Self-pay

## 2014-03-09 ENCOUNTER — Telehealth: Payer: Self-pay

## 2014-03-09 NOTE — Telephone Encounter (Signed)
Reconciliation of med list with [facility] at [facility].

## 2014-03-14 ENCOUNTER — Encounter (HOSPITAL_COMMUNITY): Payer: Self-pay | Admitting: Emergency Medicine

## 2014-03-14 ENCOUNTER — Telehealth: Payer: Self-pay | Admitting: Internal Medicine

## 2014-03-14 ENCOUNTER — Emergency Department (HOSPITAL_COMMUNITY)
Admission: EM | Admit: 2014-03-14 | Discharge: 2014-03-14 | Disposition: A | Discharge status: Home / Self Care | Payer: Medicare Other | Attending: Emergency Medicine | Admitting: Emergency Medicine

## 2014-03-14 ENCOUNTER — Emergency Department (HOSPITAL_COMMUNITY): Payer: Medicare Other

## 2014-03-14 DIAGNOSIS — I10 Essential (primary) hypertension: Secondary | ICD-10-CM | POA: Insufficient documentation

## 2014-03-14 DIAGNOSIS — D759 Disease of blood and blood-forming organs, unspecified: Secondary | ICD-10-CM | POA: Diagnosis not present

## 2014-03-14 DIAGNOSIS — Y9389 Activity, other specified: Secondary | ICD-10-CM | POA: Insufficient documentation

## 2014-03-14 DIAGNOSIS — S0990XA Unspecified injury of head, initial encounter: Secondary | ICD-10-CM | POA: Insufficient documentation

## 2014-03-14 DIAGNOSIS — W19XXXA Unspecified fall, initial encounter: Secondary | ICD-10-CM | POA: Diagnosis not present

## 2014-03-14 DIAGNOSIS — E119 Type 2 diabetes mellitus without complications: Secondary | ICD-10-CM | POA: Insufficient documentation

## 2014-03-14 DIAGNOSIS — F0390 Unspecified dementia without behavioral disturbance: Secondary | ICD-10-CM | POA: Insufficient documentation

## 2014-03-14 DIAGNOSIS — Y9210 Unspecified residential institution as the place of occurrence of the external cause: Secondary | ICD-10-CM | POA: Insufficient documentation

## 2014-03-14 DIAGNOSIS — Z7902 Long term (current) use of antithrombotics/antiplatelets: Secondary | ICD-10-CM | POA: Insufficient documentation

## 2014-03-14 DIAGNOSIS — N529 Male erectile dysfunction, unspecified: Secondary | ICD-10-CM | POA: Insufficient documentation

## 2014-03-14 DIAGNOSIS — N400 Enlarged prostate without lower urinary tract symptoms: Secondary | ICD-10-CM | POA: Diagnosis not present

## 2014-03-14 DIAGNOSIS — H409 Unspecified glaucoma: Secondary | ICD-10-CM | POA: Diagnosis not present

## 2014-03-14 DIAGNOSIS — Z87891 Personal history of nicotine dependence: Secondary | ICD-10-CM | POA: Insufficient documentation

## 2014-03-14 DIAGNOSIS — Z8719 Personal history of other diseases of the digestive system: Secondary | ICD-10-CM | POA: Diagnosis not present

## 2014-03-14 DIAGNOSIS — Z79899 Other long term (current) drug therapy: Secondary | ICD-10-CM | POA: Diagnosis not present

## 2014-03-14 DIAGNOSIS — I498 Other specified cardiac arrhythmias: Secondary | ICD-10-CM | POA: Diagnosis not present

## 2014-03-14 DIAGNOSIS — M25519 Pain in unspecified shoulder: Secondary | ICD-10-CM | POA: Insufficient documentation

## 2014-03-14 DIAGNOSIS — E038 Other specified hypothyroidism: Secondary | ICD-10-CM | POA: Insufficient documentation

## 2014-03-14 DIAGNOSIS — IMO0002 Reserved for concepts with insufficient information to code with codable children: Secondary | ICD-10-CM | POA: Diagnosis not present

## 2014-03-14 DIAGNOSIS — M129 Arthropathy, unspecified: Secondary | ICD-10-CM | POA: Diagnosis not present

## 2014-03-14 LAB — URINALYSIS, ROUTINE W REFLEX MICROSCOPIC
Bilirubin Urine: NEGATIVE
Glucose, UA: NEGATIVE mg/dL
Ketones, ur: NEGATIVE mg/dL
Leukocytes, UA: NEGATIVE
Nitrite: NEGATIVE
Protein, ur: NEGATIVE mg/dL
Specific Gravity, Urine: 1.01 (ref 1.005–1.030)
Urobilinogen, UA: 0.2 mg/dL (ref 0.0–1.0)
pH: 7 (ref 5.0–8.0)

## 2014-03-14 LAB — CBG MONITORING, ED: Glucose-Capillary: 94 mg/dL (ref 70–99)

## 2014-03-14 LAB — URINE MICROSCOPIC-ADD ON

## 2014-03-14 NOTE — ED Notes (Signed)
Rport given to [employer], Chrissie. No questions.

## 2014-03-14 NOTE — ED Notes (Signed)
Patient transported to CT 

## 2014-03-14 NOTE — ED Notes (Signed)
PT is eating a [place].

## 2014-03-14 NOTE — ED Notes (Signed)
Wife at bedside; reports he is mobile at facility. States that he had stroke in January that affected his right side; was walking around with assistance the next day. States that he wears pull ups since stroke.

## 2014-03-14 NOTE — Telephone Encounter (Signed)
LMOM for Pts wife to return call.

## 2014-03-14 NOTE — ED Notes (Signed)
PT's BP still elevated; Pt was not given his BP meds this AM before transferred to ER. PT's wife instructed to ask staff for it when he returns.

## 2014-03-14 NOTE — ED Notes (Signed)
CBG 94 

## 2014-03-14 NOTE — ED Provider Notes (Signed)
CSN: [id number]     Arrival date & time 03/14/14  4098 History   First MD Initiated Contact with Patient 03/14/14 0730     Chief Complaint  Patient presents with  . Fall     (Consider location/radiation/quality/duration/timing/severity/associated sxs/prior Treatment) HPI Pt is an 80 y/o male w/ PMHx of HTN, DM, and dementia who presents to the ED for a fall while at a nursing home. The fall was unwitnessed and pt cannot give a reliable history. Pt believes he is in [facility] center, the year is 82, he is 80 y/o, and the president is [employer]. He states that he fell while bring a patient up from the doctor's office. He reports a mild pain along his frontal forehead but otherwise has no complaints.  Past Medical History  Diagnosis Date  . BPH (benign prostatic hypertrophy)   . Diabetes mellitus   . Hypertension   . Hyperlipidemia   . Diverticulitis   . ED (erectile dysfunction)   . Glaucoma     sees eye doctor routinely  . Allergic rhinitis   . Subclinical hypothyroidism   . Arthritis   . Blood dyscrasia    Past Surgical History  Procedure Laterality Date  . Inguinal hernia repair    . Colectomy      w/ reversal due to divertiuclitis per patient in 1990s aprox  . Sbo repair    . Total knee arthroplasty Right 10/27/2012    Procedure: TOTAL KNEE ARTHROPLASTY;  Surgeon: Loreta Ave, MD;  Location: [facility] OR;  Service: Orthopedics;  Laterality: Right;   Family History  Problem Relation Age of Onset  . Lupus Sister   . Stroke Brother 62    CVA, no FH premature CAD  . Diabetes Other     granddaughter  . Colon cancer Neg Hx   . Prostate cancer Other     cousin, age 54  . CAD Mother     "enlarged heart"   History  Substance Use Topics  . Smoking status: Former Smoker    Types: Cigarettes  . Smokeless tobacco: Never Used     Comment: used to smoke rarely  . Alcohol Use: Yes     Comment: rarely    Review of Systems  Eyes: Negative for visual disturbance.   Respiratory: Negative for shortness of breath.   Cardiovascular: Negative for chest pain.  Genitourinary: Positive for frequency.  Musculoskeletal: Negative for neck pain.  Neurological: Positive for headaches (mild). Negative for dizziness and weakness.      Allergies  Beef-derived products; Pork-derived products; and Vasotec  Home Medications   Prior to Admission medications   Medication Sig Start Date End Date Taking? Authorizing Provider  acetaminophen (TYLENOL) 500 MG tablet Take 1,000 mg by mouth every 6 (six) hours as needed (pain).   Yes Historical Provider, MD  atorvastatin (LIPITOR) 40 MG tablet Take 1 tablet (40 mg total) by mouth daily at 6 PM. 09/21/13  Yes Wanda Plump, MD  bimatoprost (LUMIGAN) 0.01 % SOLN Place 1 drop into both eyes at bedtime.   Yes Historical Provider, MD  carvedilol (COREG) 6.25 MG tablet take 2 tablets by mouth twice a day with meals 02/06/14  Yes Wanda Plump, MD  clopidogrel (PLAVIX) 75 MG tablet Take 1 tablet (75 mg total) by mouth daily with breakfast. 09/21/13  Yes Wanda Plump, MD  EPIPEN 2-PAK 0.3 MG/0.3ML SOAJ injection use as directed FOR LIFE-THREATENING ALLERGIC REACTIONS   Yes Wanda Plump, MD  fluticasone (  FLONASE) 50 MCG/ACT nasal spray Place 2 sprays into both nostrils daily as needed for allergies or rhinitis.   Yes Historical Provider, MD  folic acid (FOLVITE) 1 MG tablet Take 1 tablet (1 mg total) by mouth daily. 07/27/13  Yes Hajime Art, DO  levothyroxine (SYNTHROID, LEVOTHROID) 25 MCG tablet Take 1 tablet (25 mcg total) by mouth daily before breakfast. 10/24/13  Yes Wanda Plump, MD  memantine (NAMENDA) 10 MG tablet Take 1 tablet (10 mg total) by mouth 2 (two) times daily. 10/10/13  Yes Adam Gus Rankin, DO  metFORMIN (GLUCOPHAGE) 500 MG tablet Take 1 tablet (500 mg total) by mouth 2 (two) times daily with a meal. 09/21/13  Yes Wanda Plump, MD  Multiple Vitamin (MULTIVITAMIN WITH MINERALS) TABS tablet Take 1 tablet by mouth daily. 07/27/13  Yes  Jessica U Vann, DO  saxagliptin HCl (ONGLYZA) 5 MG TABS tablet Take 1 tablet (5 mg total) by mouth daily. 09/21/13  Yes Wanda Plump, MD  senna-docusate (SENOKOT-S) 8.6-50 MG per tablet Take 1 tablet by mouth at bedtime as needed for moderate constipation. 07/27/13  Yes Dayquan Art, DO  tamsulosin (FLOMAX) 0.4 MG CAPS capsule Take 1 capsule (0.4 mg total) by mouth daily. 09/21/13  Yes Wanda Plump, MD  thiamine 100 MG tablet Take 1 tablet (100 mg total) by mouth daily. 09/14/13  Yes Wanda Plump, MD  zolpidem (AMBIEN) 5 MG tablet Take 2.5-5 mg by mouth at bedtime as needed for sleep.   Yes Wanda Plump, MD   BP 188/99  Pulse 47  Temp(Src) 97.8 F (36.6 C) (Oral)  Resp 14  SpO2 100% Physical Exam  Constitutional: He appears well-developed and well-nourished. No distress.  HENT:  Head: Normocephalic and atraumatic.  Nontender to firm palpation over back of head, no knots or bruises noted  Eyes: Pupils are equal, round, and reactive to light.  Neck: Normal range of motion. Neck supple.  Non tender to palpation of posterior neck  Cardiovascular: Regular rhythm.   No murmur heard. bradycardic  Pulmonary/Chest: Effort normal and breath sounds normal. He has no wheezes.  Abdominal: Soft. Bowel sounds are normal. There is no tenderness.  Musculoskeletal: He exhibits no edema.  Lymphadenopathy:    He has no cervical adenopathy.  Neurological: He is alert.  Not oriented to place or time.     ED Course  Procedures (including critical care time) Labs Review Labs Reviewed  URINALYSIS, ROUTINE W REFLEX MICROSCOPIC - Abnormal; Notable for the following:    Hgb urine dipstick TRACE (*)    All other components within normal limits  URINE MICROSCOPIC-ADD ON  CBG MONITORING, ED    Imaging Review Ct Head Wo Contrast  03/14/2014   CLINICAL DATA:  Acute Fall. Acute Head trauma. Patient anticoagulated. Dementia.  EXAM: CT HEAD WITHOUT CONTRAST  TECHNIQUE: Contiguous axial images were obtained from the  base of the skull through the vertex without intravenous contrast.  COMPARISON:  07/24/2013.  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 Old LEFT basal ganglia and thalamic lacunar infarcts. No mass lesion, mass effect, midline shift, hydrocephalus, hemorrhage. No acute territorial cortical ischemia/infarct. Atrophy and chronic ischemic white matter disease is present. Dense intracranial atherosclerosis as noted on prior CTA. Calvarium is intact. Visualized paranasal sinuses are within normal limits.  IMPRESSION: Atrophy, chronic ischemic white matter disease and old lacunar infarcts without acute intracranial abnormality.   Electronically Signed   By: Juliene Pina  Lamke M.D.   On: 03/14/2014 08:43      MDM   Final diagnoses:  None    Pt presents to the ED after an unwitnessed fall while at his nursing home. He has dementia but unclear if he is at his baseline. Neck has full ROM and is non tender to palpation. Will proceed w/ head CT, EKG, and UA.   Head CT negative for any acute abnormalities, UA negative, EKG unchanged from previous EKG. Pt feels good and has no complaints. Will d/c back to nursing home.     Gara Kroner, MD 03/14/14 1610  Gara Kroner, MD 03/14/14 [phone]

## 2014-03-14 NOTE — ED Notes (Signed)
Pt and wife to wait in waiting room for [facility] ride; instructed to let RN know if she needs anything else while waiting.

## 2014-03-14 NOTE — ED Notes (Signed)
PT ambulated with baseline gait; VSS; A&Ox3; no signs of distress; respirations even and unlabored; skin warm and dry; no questions upon discharge.  

## 2014-03-14 NOTE — Telephone Encounter (Signed)
Patient went to the ER, workup was unremarkable. BP was elevated. I signed  a number of papers for his residence, I am recommending them to check his BP twice a day and call if it is consistently more than 165/90. Needs followup at his convenience

## 2014-03-14 NOTE — Discharge Instructions (Signed)
Fall Prevention and Home Safety °Falls cause injuries and can affect all age groups. It is possible to prevent falls.  °HOW TO PREVENT FALLS °· Wear shoes with rubber soles that do not have an opening for your toes. °· Keep the inside and outside of your house well lit. °· Use night lights throughout your home. °· Remove clutter from floors. °· Clean up floor spills. °· Remove throw rugs or fasten them to the floor with carpet tape. °· Do not place electrical cords across pathways. °· Put grab bars by your tub, shower, and toilet. Do not use towel bars as grab bars. °· Put handrails on both sides of the stairway. Fix loose handrails. °· Do not climb on stools or stepladders, if possible. °· Do not wax your floors. °· Repair uneven or unsafe sidewalks, walkways, or stairs. °· Keep items you use a lot within reach. °· Be aware of pets. °· Keep emergency numbers next to the telephone. °· Put smoke detectors in your home and near bedrooms. °Ask your doctor what other things you can do to prevent falls. °Document Released: 04/19/2009 Document Revised: 12/23/2011 Document Reviewed: 09/23/2011 °ExitCare® Patient Information ©2015 [employer]. This information is not intended to replace advice given to you by your health care provider. Make sure you discuss any questions you have with your health care provider. ° °

## 2014-03-14 NOTE — ED Notes (Signed)
PT reports that he is unsure why he fell; states "he was helping a pt" and unsure why he fell. States he lost his balance and nurse told him his pressure elevated and he had to go to hospital. Pt has knot on back of head unclear whether related to his fall.

## 2014-03-14 NOTE — ED Notes (Signed)
PT from [facility]. Hx of dementia. Staff reported unwitnessed fall. Pt reports he "just rolled right out of the bed after taking care of a patient". On blood thinners. 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 BP manual 200/100s per EMS; BP continues to trend high. Reports he feels tired; denies headache, LOC, or any pain. Pt states he told staff he rolled out of bed; sitting on bed when EMS arrived.

## 2014-03-14 NOTE — ED Notes (Signed)
Family calling for ride arrangement from facility.

## 2014-03-14 NOTE — Telephone Encounter (Signed)
Was seen in [facility] ER, fell out of the bed/elevated BP 200/100. Pt was discharged back to [facility]. Request to speak to nurse. Please advise.

## 2014-03-14 NOTE — ED Notes (Addendum)
Pt now states he has headache. MD aware; wife at bedside; pt pleasant and watching tv.

## 2014-03-14 NOTE — ED Notes (Signed)
Pt placed in [facility]. Hooked up to BP, Pulse Ox & 12 Lead

## 2014-03-15 NOTE — Telephone Encounter (Signed)
Paperwork faxed to [facility], and informed of instructions.

## 2014-03-18 ENCOUNTER — Other Ambulatory Visit: Payer: Self-pay | Admitting: Internal Medicine

## 2014-03-19 NOTE — ED Provider Notes (Signed)
I saw and evaluated the patient, reviewed the resident's note and I agree with the findings and plan.   EKG Interpretation   Date/Time:  Tuesday March 14 2014 07:22:21 EDT Ventricular Rate:  46 PR Interval:  165 QRS Duration: 78 QT Interval:  440 QTC Calculation: 385 R Axis:   35 Text Interpretation:  Sinus bradycardia Probable left atrial enlargement  ED PHYSICIAN INTERPRETATION AVAILABLE IN [facility] Confirmed by  TEST, Record ([id number]) on 03/16/2014 7:20:73 AM      80 year old male with him and is on nursing home. Patient is unreliable historian. Because of this reason a CT of his head was done. This does not show any acute abnormality. He denies any neck pain. He is nontender on exam. He appears neurologically intact aside from his mental status which is reportedly at his baseline. I feel his for discharge at this time. Return precautions were discussed.  Raeford Razor, MD 03/19/14 [phone]

## 2014-03-23 ENCOUNTER — Ambulatory Visit (INDEPENDENT_AMBULATORY_CARE_PROVIDER_SITE_OTHER): Payer: Medicare Other | Admitting: Internal Medicine

## 2014-03-23 ENCOUNTER — Encounter: Payer: Self-pay | Admitting: Internal Medicine

## 2014-03-23 VITALS — BP 140/82 | HR 58 | Temp 98.5°F | Wt 164.1 lb

## 2014-03-23 DIAGNOSIS — I6350 Cerebral infarction due to unspecified occlusion or stenosis of unspecified cerebral artery: Secondary | ICD-10-CM

## 2014-03-23 DIAGNOSIS — I10 Essential (primary) hypertension: Secondary | ICD-10-CM

## 2014-03-23 NOTE — Progress Notes (Signed)
Subjective:    Patient ID: Christian Erickson, male    DOB: 01/14/1931, 80 y.o.   MRN: [id number]  DOS:  03/23/2014 Type of visit - description : Emergency room followup Interval history: The patient went to the ER 03/14/2014 after an unwitnessed fall. CT head, no acute BP was 200/105  Patient was released home. Since then BPs at assisted living facility are as follows: 136/70, 130/71, 128/58, 122/58, 140/82  Good compliance with medications Wife has a number of questions and concerns about his blood pressure and pulse  ROS No further falls Appetite is good No fever or chills The patient denies neck pain, back pain or headaches  Past Medical History  Diagnosis Date  . BPH (benign prostatic hypertrophy)   . Diabetes mellitus   . Hypertension   . Hyperlipidemia   . Diverticulitis   . ED (erectile dysfunction)   . Glaucoma     sees eye doctor routinely  . Allergic rhinitis   . Subclinical hypothyroidism   . Arthritis   . Blood dyscrasia     Past Surgical History  Procedure Laterality Date  . Inguinal hernia repair    . Colectomy      w/ reversal due to divertiuclitis per patient in 1990s aprox  . Sbo repair    . Total knee arthroplasty Right 10/27/2012    Procedure: TOTAL KNEE ARTHROPLASTY;  Surgeon: Loreta Ave, MD;  Location: [facility] OR;  Service: Orthopedics;  Laterality: Right;    History   Social History  . Marital Status: Married    Spouse Name: N/A    Number of Children: 2  . Years of Education: N/A   Occupational History  . Retired-- Hotel manager, [employer]    Social History Main Topics  . Smoking status: Former Smoker    Types: Cigarettes  . Smokeless tobacco: Never Used     Comment: used to smoke rarely  . Alcohol Use: Yes     Comment: rarely  . Drug Use: No  . Sexual Activity: Not on file   Other Topics Concern  . Not on file   Social History Narrative   Wife had CABG 2011   Married x > 44 years, children x 2 , 2 G daughters   Lives at the  memory unit ([facility] as of 09-2013)                 Medication List       This list is accurate as of: 03/23/14  4:48 PM.  Always use your most recent med list.               acetaminophen 500 MG tablet  Commonly known as:  TYLENOL  Take 1,000 mg by mouth every 6 (six) hours as needed (pain).     atorvastatin 40 MG tablet  Commonly known as:  LIPITOR  Take 1 tablet (40 mg total) by mouth daily at 6 PM.     bimatoprost 0.01 % Soln  Commonly known as:  LUMIGAN  Place 1 drop into both eyes at bedtime.     carvedilol 6.25 MG tablet  Commonly known as:  COREG  take 2 tablets by mouth twice a day with meals     clopidogrel 75 MG tablet  Commonly known as:  PLAVIX  Take 1 tablet (75 mg total) by mouth daily with breakfast.     EPIPEN 2-PAK 0.3 mg/0.3 mL Soaj injection  Generic drug:  EPINEPHrine  use as directed FOR LIFE-THREATENING  ALLERGIC REACTIONS     fluticasone 50 MCG/ACT nasal spray  Commonly known as:  FLONASE  Place 2 sprays into both nostrils daily as needed for allergies or rhinitis.     folic acid 1 MG tablet  Commonly known as:  FOLVITE  Take 1 tablet (1 mg total) by mouth daily.     IMODIUM A-D 1 MG/7.5ML Liqd  Generic drug:  Loperamide HCl  Take 30 mLs by mouth 3 (three) times daily as needed.     levothyroxine 25 MCG tablet  Commonly known as:  SYNTHROID, LEVOTHROID  Take 1 tablet (25 mcg total) by mouth daily before breakfast.     memantine 10 MG tablet  Commonly known as:  NAMENDA  Take 1 tablet (10 mg total) by mouth 2 (two) times daily.     metFORMIN 500 MG tablet  Commonly known as:  GLUCOPHAGE  Take 1 tablet (500 mg total) by mouth 2 (two) times daily with a meal.     multivitamin with minerals Tabs tablet  Take 1 tablet by mouth daily.     saxagliptin HCl 5 MG Tabs tablet  Commonly known as:  ONGLYZA  Take 1 tablet (5 mg total) by mouth daily.     senna-docusate 8.6-50 MG per tablet  Commonly known as:  Senokot-S  Take 1  tablet by mouth at bedtime as needed for moderate constipation.     tamsulosin 0.4 MG Caps capsule  Commonly known as:  FLOMAX  Take 1 capsule (0.4 mg total) by mouth daily.     thiamine 100 MG tablet  Take 1 tablet (100 mg total) by mouth daily.     zolpidem 5 MG tablet  Commonly known as:  AMBIEN  Take 2.5-5 mg by mouth at bedtime as needed for sleep.           Objective:   Physical Exam BP 140/82  Pulse 58  Temp(Src) 98.5 F (36.9 C) (Oral)  Wt 164 lb 2 oz (74.447 kg)  SpO2 97%  General -- alert, well-developed, NAD.   Lungs -- normal respiratory effort, no intercostal retractions, no accessory muscle use, and normal breath sounds.  Heart-- normal rate, regular rhythm, no murmur.  Extremities-- no pretibial edema bilaterally  Neurologic--  alert &  pleasently demented; Walk is steady with a cane Psych--   No anxious or depressed appearing.         Assessment & Plan:     F2F 20 min

## 2014-03-23 NOTE — Progress Notes (Signed)
Pre visit review using our clinic review tool, if applicable. No additional management support is needed unless otherwise documented below in the visit note. 

## 2014-03-23 NOTE — Assessment & Plan Note (Addendum)
BP was quite elevated at the ER, good BPs at his assisted-living facility and on today's visit. I explained the patient's wife that his BP and pulse are okay, BP may increase if he is in  distress like for instance if he falls again but there is no need to change his meds at this time.. Plan-- ontinue carvedilol, clear instructions to the ALF to continue checking his BPs. See AVS

## 2014-03-23 NOTE — Patient Instructions (Signed)
Continue the same medications Take her blood pressure 5 times a week Call if the blood pressure is consistently more than 150/90 to or less than 110/60 Call if the pulse is less than 50 Next visit by December 2015

## 2014-03-29 ENCOUNTER — Other Ambulatory Visit: Payer: Self-pay | Admitting: Internal Medicine

## 2014-03-30 ENCOUNTER — Other Ambulatory Visit: Payer: Self-pay

## 2014-03-30 ENCOUNTER — Other Ambulatory Visit: Payer: Self-pay | Admitting: Internal Medicine

## 2014-04-06 ENCOUNTER — Telehealth: Payer: Self-pay

## 2014-04-06 NOTE — Telephone Encounter (Signed)
[facility], requesting authorization from Dr. Drue NovelPaz for PT for Pt. Order authorized by Dr. Drue NovelPaz and [facility]. Forms placed in folder to be scanned.

## 2014-04-06 NOTE — Telephone Encounter (Signed)
[facility], requesting authorization on flu shot, and cough medication for Pt. Dr. Drue NovelPaz has signed and authorized forms and forms have been faxed [facility] to [facility]. Paperwork placed in folder to be scanned.

## 2014-04-07 ENCOUNTER — Ambulatory Visit: Payer: Medicare Other | Admitting: Neurology

## 2014-04-07 ENCOUNTER — Telehealth: Payer: Self-pay

## 2014-04-07 NOTE — Telephone Encounter (Signed)
[facility] requesting verification on Pts medication list; med list is current and signed by Dr. Drue NovelPaz and [facility]. Forms placed in folder to be scanned.

## 2014-04-11 ENCOUNTER — Telehealth: Payer: Self-pay

## 2014-04-11 NOTE — Telephone Encounter (Signed)
[facility] requesting clarification on the amount of times they should be checking Pts BP. Dr. Drue NovelPaz would like them to check BP x5 weekly, call if consistently greater than 150/90 or less than 110/60 or if pulse is less than or equal to 50. Instructions faxed back to [facility]. Form placed in folder to be scanned.

## 2014-04-13 ENCOUNTER — Telehealth: Payer: Self-pay | Admitting: Internal Medicine

## 2014-04-13 NOTE — Telephone Encounter (Signed)
Fax from [facility]----  BP was 190/100 with a pulse of 62. Faxed back instructions: Change to carvedilol 12.5 one by mouth twice a day Check  BP 5 times a week Call w/ readings in 10 days Call if BP consistently more than 160/90 or pulse less than 50.

## 2014-04-20 ENCOUNTER — Telehealth: Payer: Self-pay | Admitting: *Deleted

## 2014-04-20 NOTE — Telephone Encounter (Signed)
Received Personal Care Physician Authorization and Care Plan via fax from [facility]. Billing sheet attached and forms forwarded to Dr. Drue NovelPaz for review and signature. JG//CMA

## 2014-04-25 ENCOUNTER — Ambulatory Visit: Payer: Medicare Other | Admitting: Neurology

## 2014-04-25 ENCOUNTER — Telehealth: Payer: Self-pay | Admitting: Internal Medicine

## 2014-04-25 NOTE — Telephone Encounter (Signed)
Caller name: Neysa BonitoChristy Relation to ZO:[facility]  Call back number: [phone]   Reason for call:  Faxed over orders on 10/15.  Needing written orders for patients folic acid (FOLVITE) 1 MG tablet.  Call Christy/

## 2014-04-25 NOTE — Telephone Encounter (Signed)
Signed form faxed. JG//CMA

## 2014-04-25 NOTE — Telephone Encounter (Signed)
Form is on counter for Dr. Drue NovelPaz to sign. JG//CMA

## 2014-04-25 NOTE — Telephone Encounter (Signed)
Have you seen paperwork on this Pt?

## 2014-04-26 ENCOUNTER — Telehealth: Payer: Self-pay | Admitting: Internal Medicine

## 2014-04-26 ENCOUNTER — Telehealth: Payer: Self-pay

## 2014-04-26 MED ORDER — ZOLPIDEM TARTRATE 5 MG PO TABS: 2.5000 mg | ORAL_TABLET | Freq: Every evening | ORAL | Status: DC | PRN

## 2014-04-26 NOTE — Telephone Encounter (Signed)
Faxed to [facility].

## 2014-04-26 NOTE — Telephone Encounter (Signed)
Pt is requesting refill on Zolpidem  Last OV: 03/23/2014 Last Fill: 02/27/2014 #30 1 RF UDS: None  Please advise.

## 2014-04-26 NOTE — Telephone Encounter (Signed)
We already faxed an answer, he needs 1000 mcg

## 2014-04-26 NOTE — Telephone Encounter (Signed)
Please advise 

## 2014-04-26 NOTE — Telephone Encounter (Signed)
Caller name: Efraim Kaufmannmelissa hairston--[facility] Relation to pt: Call back number: [phone] [facility] P: [phone] F: [phone]  Reason for call:   Patient wife was giving patient 2 400micro of folic acid but wants to know if Dr. Drue NovelPaz would write an rx for 1000microgram.

## 2014-04-26 NOTE — Telephone Encounter (Signed)
Printed, #30 and 2 RF

## 2014-04-28 ENCOUNTER — Telehealth: Payer: Self-pay | Admitting: Neurology

## 2014-04-28 NOTE — Telephone Encounter (Signed)
Pt resch appt from 05-08-14 to 05-25-14

## 2014-05-08 ENCOUNTER — Ambulatory Visit: Payer: Medicare Other | Admitting: Neurology

## 2014-05-09 ENCOUNTER — Telehealth: Payer: Self-pay | Admitting: *Deleted

## 2014-05-09 NOTE — Telephone Encounter (Signed)
Received physician's orders (MAR) via fax [facility]. Medications reveiwed and forms forwarded to Dr. Drue NovelPaz. JG//CMA

## 2014-05-15 ENCOUNTER — Encounter: Payer: Self-pay | Admitting: Neurology

## 2014-05-15 ENCOUNTER — Ambulatory Visit (INDEPENDENT_AMBULATORY_CARE_PROVIDER_SITE_OTHER): Payer: Medicare Other | Admitting: Neurology

## 2014-05-15 VITALS — BP 140/70 | HR 70 | Temp 98.4°F | Resp 16 | Ht 66.0 in | Wt 166.9 lb

## 2014-05-15 DIAGNOSIS — F0280 Dementia in other diseases classified elsewhere without behavioral disturbance: Secondary | ICD-10-CM

## 2014-05-15 DIAGNOSIS — I639 Cerebral infarction, unspecified: Secondary | ICD-10-CM

## 2014-05-15 DIAGNOSIS — G309 Alzheimer's disease, unspecified: Secondary | ICD-10-CM

## 2014-05-15 DIAGNOSIS — F0150 Vascular dementia without behavioral disturbance: Secondary | ICD-10-CM

## 2014-05-15 NOTE — Patient Instructions (Addendum)
1.  Continue Namenda. 2.  Continue Plavix 3.  Continue participating in social activities and exercise 4.  Follow up in 6 months for re-evaluation and to [facility]

## 2014-05-15 NOTE — Progress Notes (Signed)
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  He is accompanied by his wife, who provides some of the history.  Records and labs reviewed.  UPDATE: He is taking Namenda 10mg  twice daily.  He is taking Plavix for secondary stroke prevention.  He has been doing well.  [facility]. He is able to bathe and dress himself.   He wears pull-ups and is able to clean himself.  He requires some help in remembering to take his medications.  He participates in PT regularly.  He tries to remain social.  He plays bingo and will watch TV in the common area with others.  He also is participating in a chorus.  He is sleeping better these days.  He denies depression.  He denies hallucinations or delusions.  Hgb A1c from 02/27/14 was 7.5.  LDL from 09/12/13 was 60.  HISTORY: His wife first noticed cognitive problems approximately 5 years ago. At first, he would leave doors open, such as the house, car, and freezer. He would often loses items as well. Sometimes he would leave the stove on. He was still driving and was still able to participate in the household finances. He was diagnosed with dementia approximately one and a half years ago. However, he refuses to start any medications such as Aricept. [facility] after he was found on the floor by his wife after she heard a thud.  He was awake but not following commands.  He was found to have left-sided gaze, slurred speech and right-sided weakness.  Systolic blood pressure was in the 190s.  CBG was 131.  CT of head revealed mild diffuse atrophy with compensatory ventriculomegaly but no acute intracranial abnormality.  MRI of the brain without contrast revealed small  acute infarcts involving the left caudate/corona radiate and left thalamus.  CTA of the head and neck revealed moderate to high-grade calcific stenosis within the cavernous carotid arteries bilaterally.  There is also dense atherosclerotic calcifications at the carotid bifurcations bilaterally without definite stenosis on either side. 2. Tortuosity of the distal right ICA and medial deviation of the proximal left ICA, 1-39% right ICA stenosis and 40-59% left ICA stenosis.  2D echo revealed LVEF 55-60% and grade 1 diastolic dysfunction with no source of emboli.  Hgb A1c was 6.9.  LDL was 98.  B12 was 412.  There has been a significant progression in his cognitive deficits since the stroke. At first, he had trouble recognizing familiar people but this has since improved. However, he now is unable to participate in household finances or filing taxes. He is more irritable and easily agitated. 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 Since the stroke, he previously exhibited delusions of still participating in [employer] life. Sometimes he thought he was going on missions. He is no longer driving. It should be noted that his sister had Alzheimer's disease. As far as physical deficits from the stroke, such as right-sided weakness, this has resolved.  MOCA from 10/04/13 was 12/30.  PAST MEDICAL HISTORY: Past Medical History  Diagnosis Date  . BPH (benign prostatic hypertrophy)   . Diabetes mellitus   . Hypertension   .  Hyperlipidemia   . Diverticulitis   . ED (erectile dysfunction)   . Glaucoma     sees eye doctor routinely  . Allergic rhinitis   . Subclinical hypothyroidism   . Arthritis   . Blood dyscrasia     MEDICATIONS: Current Outpatient Prescriptions on File Prior to Visit  Medication Sig Dispense Refill  . acetaminophen (TYLENOL) 500 MG tablet Take 1,000 mg by mouth every 6 (six) hours as needed (pain).    [name] atorvastatin (LIPITOR) 40 MG  tablet Take 1 tablet (40 mg total) by mouth daily at 6 PM. 90 tablet 3  . bimatoprost (LUMIGAN) 0.01 % SOLN Place 1 drop into both eyes at bedtime.    . carvedilol (COREG) 12.5 MG tablet Take 12.5 mg by mouth 2 (two) times daily with a meal.    . clopidogrel (PLAVIX) 75 MG tablet Take 1 tablet (75 mg total) by mouth daily with breakfast. 90 tablet 3  . EPIPEN 2-PAK 0.3 MG/0.3ML SOAJ injection use as directed FOR LIFE-THREATENING ALLERGIC REACTIONS 2 Device 1  . fluticasone (FLONASE) 50 MCG/ACT nasal spray Place 2 sprays into both nostrils daily as needed for allergies or rhinitis.    . folic acid (FOLVITE) 1 MG tablet Take 1 tablet (1 mg total) by mouth daily.    [name] levothyroxine (SYNTHROID, LEVOTHROID) 25 MCG tablet Take 1 tablet (25 mcg total) by mouth daily before breakfast. 90 tablet 1  . levothyroxine (SYNTHROID, LEVOTHROID) 25 MCG tablet take 1 tablet by mouth every morning BEFORE BREAKFAST 30 tablet 6  . Loperamide HCl (IMODIUM A-D) 1 MG/7.5ML LIQD Take 30 mLs by mouth 3 (three) times daily as needed.    . memantine (NAMENDA) 10 MG tablet Take 1 tablet (10 mg total) by mouth 2 (two) times daily. 180 tablet 6  . metFORMIN (GLUCOPHAGE) 500 MG tablet Take 1 tablet (500 mg total) by mouth 2 (two) times daily with a meal. 180 tablet 3  . Multiple Vitamin (MULTIVITAMIN WITH MINERALS) TABS tablet Take 1 tablet by mouth daily.    . saxagliptin HCl (ONGLYZA) 5 MG TABS tablet Take 1 tablet (5 mg total) by mouth daily. 90 tablet 3  . senna-docusate (SENOKOT-S) 8.6-50 MG per tablet Take 1 tablet by mouth at bedtime as needed for moderate constipation.    . tamsulosin (FLOMAX) 0.4 MG CAPS capsule Take 1 capsule (0.4 mg total) by mouth daily. 90 capsule 3  . tamsulosin (FLOMAX) 0.4 MG CAPS capsule take 1 capsule by mouth once daily 30 capsule 6  . thiamine 100 MG tablet Take 1 tablet (100 mg total) by mouth daily. 30 tablet 6  . zolpidem (AMBIEN) 5 MG tablet Take 0.5-1 tablets (2.5-5 mg total) by mouth at  bedtime as needed for sleep. 30 tablet 2  . [DISCONTINUED] cloNIDine (CATAPRES) 0.2 MG tablet Take 0.2 mg by mouth 2 (two) times daily.     No current facility-administered medications on file prior to visit.    ALLERGIES: Allergies  Allergen Reactions  . Beef-Derived Products Swelling  . Pork-Derived Products Swelling  . Vasotec     Angioedema, see OV 09-05-11    FAMILY HISTORY: Family History  Problem Relation Age of Onset  . Lupus Sister   . Stroke Brother 84    CVA, no FH premature CAD  . Diabetes Other     granddaughter  . Colon cancer Neg Hx   . Prostate cancer Other     cousin, age 68  . CAD Mother     "  enlarged heart"    SOCIAL HISTORY: History   Social History  . Marital Status: Married    Spouse Name: N/A    Number of Children: 2  . Years of Education: N/A   Occupational History  . Retired-- Hotel managermilitary, [employer]    Social History Main Topics  . Smoking status: Former Smoker    Types: Cigarettes  . Smokeless tobacco: Never Used     Comment: used to smoke rarely  . Alcohol Use: 0.0 oz/week    0 Not specified per week     Comment: rarely  . Drug Use: No  . Sexual Activity: No   Other Topics Concern  . Not on file   Social History Narrative   Wife had CABG 2011   Married x > 44 years, children x 2 , 2 G daughters   Lives at the memory unit ([facility] as of 09-2013)             REVIEW OF SYSTEMS: Constitutional: No fevers, chills, or sweats, no generalized fatigue, change in appetite Eyes: No visual changes, double vision, eye pain Ear, nose and throat: No hearing loss, ear pain, nasal congestion, sore throat Cardiovascular: No chest pain, palpitations Respiratory:  No shortness of breath at rest or with exertion, wheezes GastrointestinaI: No nausea, vomiting, diarrhea, abdominal pain, fecal incontinence Genitourinary:  No dysuria, urinary retention or frequency Musculoskeletal:  No neck pain, back pain Integumentary: No rash,  pruritus, skin lesions Neurological: as above Psychiatric: No depression, insomnia, anxiety Endocrine: No palpitations, fatigue, diaphoresis, mood swings, change in appetite, change in weight, increased thirst Hematologic/Lymphatic:  No anemia, purpura, petechiae. Allergic/Immunologic: no itchy/runny eyes, nasal congestion, recent allergic reactions, rashes  PHYSICAL EXAM: Filed Vitals:   05/15/14 1435  BP: 140/70  Pulse: 70  Temp: 98.4 F (36.9 C)  Resp: 16   General: No acute distress Eyes:  Fundi attempted but was not visualized Head:  Normocephalic/atraumatic Neck: supple, no paraspinal tenderness, full range of motion Heart:  Regular rate and rhythm Lungs:  Clear to auscultation bilaterally Back: No paraspinal tenderness Neurological Exam: alert and oriented to self and city only. Attention span and concentration intact, recent memory impaired and remote memory intact, fund of knowledge intact.  Speech fluent and not dysarthric, language intact.   Montreal Cognitive Assessment  05/15/2014  Visuospatial/ Executive (0/5) 0  Naming (0/3) 3  Attention: Read list of digits (0/2) 2  Attention: Read list of letters (0/1) 1  Attention: Serial 7 subtraction starting at 100 (0/3) 1  Language: Repeat phrase (0/2) 2  Language : Fluency (0/1) 0  Abstraction (0/2) 1  Delayed Recall (0/5) 0  Orientation (0/6) 1  Total 11  Adjusted Score (based on education) 11   CN II-XII intact. Fundi not visualized.  Bulk and tone normal, muscle strength 5/5 throughout.  Sensation to light touch intact.  Deep tendon reflexes 2+ throughout.  Finger to nose  intact.  Gait normal.  IMPRESSION: Mixed vascular and Alzheimer's dementia  PLAN: 1.  Continue Namenda 10mg  twice daily 2.  Continue Plavix for secondary stroke prevention 3.  Continue statin (LDL at goal of less than 70) 4.  Optimize blood pressure and diabetes control 5.  Continue participation in PT and social activities 6.  Follow up in  6 months.  Shon MilletAdam Jaffe, DO  CC:  Willow OraJose Paz, MD

## 2014-05-22 ENCOUNTER — Other Ambulatory Visit: Payer: Self-pay | Admitting: Internal Medicine

## 2014-05-24 ENCOUNTER — Telehealth: Payer: Self-pay | Admitting: Internal Medicine

## 2014-05-24 MED ORDER — FLUTICASONE PROPIONATE 50 MCG/ACT NA SUSP: 2.0000 | Freq: Every day | NASAL | Status: DC | PRN

## 2014-05-24 NOTE — Telephone Encounter (Signed)
[facility] [address] as requested.

## 2014-05-24 NOTE — Telephone Encounter (Signed)
Caller name: Christian Erickson Relation to pt: self Call back number: [phone] [facility] [address]  Reason for call:   Patient wife wanting to know if an rx for flonase could be called in. He usually busy OTC but would like rx

## 2014-06-14 ENCOUNTER — Other Ambulatory Visit: Payer: Self-pay

## 2014-06-14 MED ORDER — CARVEDILOL 12.5 MG PO TABS: 12.5000 mg | ORAL_TABLET | Freq: Two times a day (BID) | ORAL | Status: DC

## 2014-06-16 ENCOUNTER — Telehealth: Payer: Self-pay | Admitting: Internal Medicine

## 2014-06-16 MED ORDER — CARVEDILOL 6.25 MG PO TABS: ORAL_TABLET | ORAL | Status: DC

## 2014-06-16 NOTE — Telephone Encounter (Signed)
Please advise. [facility] only has 6.25 mg instead of 12.5 mg at this time.

## 2014-06-16 NOTE — Telephone Encounter (Signed)
Caller name: Meritt,[name] Relation to pt: spouse  Call back number: [phone] Pharmacy:[facility]   Reason for call:  carvedilol (COREG) 12.5 MG tablet [facility] is only refilling for 6.25 please call [phone]

## 2014-06-16 NOTE — Telephone Encounter (Signed)
Carvedilol 6.25 mg 2 tablets bid sent to [facility].

## 2014-06-16 NOTE — Telephone Encounter (Signed)
Okay to use carvedilol 6.25 mg 2 tablets twice a day

## 2014-07-05 ENCOUNTER — Other Ambulatory Visit: Payer: Self-pay | Admitting: Internal Medicine

## 2014-07-10 ENCOUNTER — Ambulatory Visit: Payer: Medicare Other | Admitting: Internal Medicine

## 2014-07-18 ENCOUNTER — Telehealth: Payer: Self-pay

## 2014-07-18 NOTE — Telephone Encounter (Signed)
Reviewed and signed by Dr. Drue NovelPaz. Faxed back to [facility] at  [phone]. Placed in scanning folder to be scanned.

## 2014-07-18 NOTE — Telephone Encounter (Signed)
Fax received reporting that "pt has had a sore throat for a couple of days, has now developed a cough with some congestion."  Physician response required.  Form labeled and placed in Dr. Leta JunglingPaz's red folder for review and signature.

## 2014-07-22 ENCOUNTER — Telehealth: Payer: Self-pay | Admitting: Internal Medicine

## 2014-07-24 ENCOUNTER — Encounter: Payer: Self-pay | Admitting: Internal Medicine

## 2014-07-24 ENCOUNTER — Telehealth: Payer: Self-pay | Admitting: *Deleted

## 2014-07-24 ENCOUNTER — Ambulatory Visit (INDEPENDENT_AMBULATORY_CARE_PROVIDER_SITE_OTHER): Payer: Medicare Other | Admitting: Internal Medicine

## 2014-07-24 VITALS — BP 144/82 | HR 77 | Temp 98.0°F | Ht 66.0 in | Wt 166.0 lb

## 2014-07-24 DIAGNOSIS — J209 Acute bronchitis, unspecified: Secondary | ICD-10-CM

## 2014-07-24 DIAGNOSIS — E1159 Type 2 diabetes mellitus with other circulatory complications: Secondary | ICD-10-CM

## 2014-07-24 DIAGNOSIS — I10 Essential (primary) hypertension: Secondary | ICD-10-CM

## 2014-07-24 MED ORDER — AZITHROMYCIN 250 MG PO TABS: ORAL_TABLET | ORAL | Status: DC

## 2014-07-24 NOTE — Patient Instructions (Signed)
Get your blood work before you leave   Continue with Mucinex DM for cough Start taking an antibiotic called Z-Pak If the cough is not  gradually better in the next 10 days please let me know. If fever, chills, difficulty breathing or increased cough: Call the office  Please come back to the office in 4 months  for a routine check up    Schedule the visit at the front desk

## 2014-07-24 NOTE — Telephone Encounter (Signed)
Received care plan via fax from [facility]. Forwarded to Dr. Drue NovelPaz. JG//CMA

## 2014-07-24 NOTE — Telephone Encounter (Signed)
done

## 2014-07-24 NOTE — Telephone Encounter (Signed)
Faxed to [facility] pharmacy

## 2014-07-24 NOTE — Progress Notes (Signed)
Pre visit review using our clinic review tool, if applicable. No additional management support is needed unless otherwise documented below in the visit note. 

## 2014-07-24 NOTE — Progress Notes (Signed)
Subjective:    Patient ID: Christian Erickson, male    DOB: 1931-03-22, 81 y.o.   MRN: [id number]  DOS:  07/24/2014 Type of visit - description : Routine visit Interval history: Paperwork from his nursing home reviewed and signed. HTN ambulatory BPs between 130/70 and 140/90, mostly under 140 DM--Good compliance with metformin, CBGs in the morning around 120, in the afternoon 140-160.  also 2 weeks history of cough, is only in the last few days that he is starting to produce a sputum, light brown in color, the patient reports she sees occasionally small amount of red blood. Taking Mucinex.  ROS  denies fevers, chills sometimes? Appetite remains normal. Denies chest pain or difficulty breathing, no wheezing. No nausea, vomiting or diarrhea   Past Medical History  Diagnosis Date  . BPH (benign prostatic hypertrophy)   . Diabetes mellitus   . Hypertension   . Hyperlipidemia   . Diverticulitis   . ED (erectile dysfunction)   . Glaucoma     sees eye doctor routinely  . Allergic rhinitis   . Subclinical hypothyroidism   . Arthritis   . Blood dyscrasia     Past Surgical History  Procedure Laterality Date  . Inguinal hernia repair    . Colectomy      w/ reversal due to divertiuclitis per patient in 1990s aprox  . Sbo repair    . Total knee arthroplasty Right 10/27/2012    Procedure: TOTAL KNEE ARTHROPLASTY;  Surgeon: Loreta Aveaniel F Murphy, MD;  [facility];  Service: Orthopedics;  Laterality: Right;    History   Social History  . Marital Status: Married    Spouse Name: N/A    Number of Children: 2  . Years of Education: N/A   Occupational History  . Retired-- Hotel managermilitary, [employer]    Social History Main Topics  . Smoking status: Former Smoker    Types: Cigarettes  . Smokeless tobacco: Never Used     Comment: used to smoke rarely  . Alcohol Use: 0.0 oz/week    0 Not specified per week     Comment: rarely  . Drug Use: No  . Sexual Activity: No   Other Topics Concern   . Not on file   Social History Narrative   Wife had CABG 2011   Married x > 44 years, children x 2 , 2 G daughters   Lives at the memory unit ([facility] as of 09-2013)                 Medication List       This list is accurate as of: 07/24/14 11:59 PM.  Always use your most recent med list.               acetaminophen 500 MG tablet  Commonly known as:  TYLENOL  Take 1,000 mg by mouth every 6 (six) hours as needed (pain).     atorvastatin 40 MG tablet  Commonly known as:  LIPITOR  Take 1 tablet (40 mg total) by mouth daily at 6 PM.     azithromycin 250 MG tablet  Commonly known as:  ZITHROMAX Z-PAK  2 tabs a day the first day, then 1 tab a day x 4 days     bimatoprost 0.01 % Soln  Commonly known as:  LUMIGAN  Place 1 drop into both eyes at bedtime.     carvedilol 12.5 MG tablet  Commonly known as:  COREG  Take 1 tablet (12.5 mg  total) by mouth 2 (two) times daily with a meal.     clopidogrel 75 MG tablet  Commonly known as:  PLAVIX  Take 1 tablet (75 mg total) by mouth daily with breakfast.     EPIPEN 2-PAK 0.3 mg/0.3 mL Soaj injection  Generic drug:  EPINEPHrine  use as directed FOR LIFE-THREATENING ALLERGIC REACTIONS     fluticasone 50 MCG/ACT nasal spray  Commonly known as:  FLONASE  Place 2 sprays into both nostrils daily as needed for allergies or rhinitis.     folic acid 1 MG tablet  Commonly known as:  FOLVITE  take 1 tablet by mouth once daily     IMODIUM A-D 1 MG/7.5ML Liqd  Generic drug:  Loperamide HCl  Take 30 mLs by mouth 3 (three) times daily as needed.     levothyroxine 25 MCG tablet  Commonly known as:  SYNTHROID, LEVOTHROID  TAKE 1 TABLET DAILY BEFORE BREAKFAST     memantine 10 MG tablet  Commonly known as:  NAMENDA  Take 1 tablet (10 mg total) by mouth 2 (two) times daily.     metFORMIN 500 MG tablet  Commonly known as:  GLUCOPHAGE  Take 1 tablet (500 mg total) by mouth 2 (two) times daily with a meal.     multivitamin  with minerals Tabs tablet  Take 1 tablet by mouth daily.     saxagliptin HCl 5 MG Tabs tablet  Commonly known as:  ONGLYZA  Take 1 tablet (5 mg total) by mouth daily.     senna-docusate 8.6-50 MG per tablet  Commonly known as:  Senokot-S  Take 1 tablet by mouth at bedtime as needed for moderate constipation.     tamsulosin 0.4 MG Caps capsule  Commonly known as:  FLOMAX  Take 1 capsule (0.4 mg total) by mouth daily.     thiamine 100 MG tablet  Take 1 tablet (100 mg total) by mouth daily.     zolpidem 5 MG tablet  Commonly known as:  AMBIEN  take 1 tablet by mouth at bedtime if needed for sleep           Objective:   Physical Exam BP 144/82 mmHg  Pulse 77  Temp(Src) 98 F (36.7 C) (Oral)  Ht  (1.676 m)  Wt 166 lb (75.297 kg)  BMI 26.81 kg/m2  SpO2 96%  General -- alert, well-developed, NAD.  HEENT-- Not pale.  Unable to see TMs due to wax Throat symmetric, no redness or discharge. Face symmetric, sinuses not tender to palpation. Nose slt congested. Lungs -- normal respiratory effort, no intercostal retractions, no accessory muscle usbreath sounds are slightly decreased at bases, a few rhonchi mostly with cough CV-- normal rate, regular rhythm, no murmur.  Extremities-- no pretibial edema bilaterally  Neurologic--   Speech normal, gait appropriate for age  Psych-- pleasant,  No anxious or depressed appearing.     Assessment & Plan:   Bronchitis, see instructions

## 2014-07-24 NOTE — Telephone Encounter (Signed)
Pt requesting refill on Ambien.  Last OV: 03/23/2014 Last Fill: 04/26/2014 #30 2 RF UDS: None  Please advise.

## 2014-07-25 LAB — CBC WITH DIFFERENTIAL/PLATELET
BASOS ABS: 0 10*3/uL (ref 0.0–0.1)
Basophils Relative: 0.5 % (ref 0.0–3.0)
EOS ABS: 0.2 10*3/uL (ref 0.0–0.7)
EOS PCT: 3.1 % (ref 0.0–5.0)
HCT: 42.3 % (ref 39.0–52.0)
HEMOGLOBIN: 13.9 g/dL (ref 13.0–17.0)
LYMPHS ABS: 1.4 10*3/uL (ref 0.7–4.0)
LYMPHS PCT: 18 % (ref 12.0–46.0)
MCHC: 32.9 g/dL (ref 30.0–36.0)
MCV: 88.7 fl (ref 78.0–100.0)
Monocytes Absolute: 0.3 10*3/uL (ref 0.1–1.0)
Monocytes Relative: 3.3 % (ref 3.0–12.0)
NEUTROS ABS: 5.8 10*3/uL (ref 1.4–7.7)
Neutrophils Relative %: 75.1 % (ref 43.0–77.0)
PLATELETS: 134 10*3/uL — AB (ref 150.0–400.0)
RBC: 4.77 Mil/uL (ref 4.22–5.81)
RDW: 14.6 % (ref 11.5–15.5)
WBC: 7.7 10*3/uL (ref 4.0–10.5)

## 2014-07-25 LAB — BASIC METABOLIC PANEL
BUN: 20 mg/dL (ref 6–23)
CO2: 29 meq/L (ref 19–32)
Calcium: 9.2 mg/dL (ref 8.4–10.5)
Chloride: 108 mEq/L (ref 96–112)
Creatinine, Ser: 1.21 mg/dL (ref 0.40–1.50)
GFR: 73.87 mL/min (ref >60.00)
Glucose, Bld: 127 mg/dL — ABNORMAL HIGH (ref 70–99)
Potassium: 4.2 mEq/L (ref 3.5–5.1)
Sodium: 144 mEq/L (ref 135–145)

## 2014-07-25 LAB — HEMOGLOBIN A1C: HEMOGLOBIN A1C: 7.8 % — AB (ref 4.6–6.5)

## 2014-07-25 NOTE — Assessment & Plan Note (Signed)
Continue with Onglyza, metformin, check A1c

## 2014-07-25 NOTE — Assessment & Plan Note (Signed)
Seems well-controlled, continue with carvedilol, check a BMP

## 2014-07-25 NOTE — Telephone Encounter (Signed)
faxed

## 2014-08-04 ENCOUNTER — Telehealth: Payer: Self-pay

## 2014-08-04 NOTE — Telephone Encounter (Signed)
UDS: 07/24/2014   Negative for Ambien  Per Dr. Drue NovelPaz no need for UDS when only taking Ambien.   Low risk per Dr. Drue NovelPaz 08/04/2014

## 2014-08-08 NOTE — Telephone Encounter (Signed)
Patient wife states that the facility never received this paperwork.

## 2014-08-08 NOTE — Telephone Encounter (Signed)
Patient wife states that she needs papework for an appointment tomorrow. She would like to pick up later today. Best # [phone]

## 2014-08-08 NOTE — Telephone Encounter (Signed)
Informed patient wife of this and she states that she will bring form by this afternoon and pick up before the meeting tomorrow.

## 2014-08-08 NOTE — Telephone Encounter (Signed)
Best # [phone] Patient wants to know if she can bring in another copy to have filled out. She needs this for a meeting tomorrow morning.

## 2014-08-08 NOTE — Telephone Encounter (Signed)
This paperwork has been faxed to facility as noted below and sent for scanning. I do not have it in office.

## 2014-08-08 NOTE — Telephone Encounter (Signed)
Sure that's fine, but I leave at 4 today.

## 2014-08-08 NOTE — Telephone Encounter (Signed)
Patient wife states that this paperwork needs to be mailed to patient.

## 2014-08-09 ENCOUNTER — Other Ambulatory Visit: Payer: Self-pay | Admitting: Internal Medicine

## 2014-08-09 NOTE — Telephone Encounter (Signed)
Form completed, signed by Dr. Drue NovelPaz. Called and informed patient's wife that form is ready for pick up at our front desk. Copy sent for scanning. JG//CMA

## 2014-09-05 ENCOUNTER — Telehealth: Payer: Self-pay | Admitting: *Deleted

## 2014-09-05 NOTE — Telephone Encounter (Signed)
Sp[facility] with [facility] at [facility], informed her of Dr. Drue NovelPaz recommendations. Christian Erickson informed me that Pt has had BM this morning (09/05/2014) and they will order UA and urine culture. Informed her to also watch Pt and redirect, reassurance if needed and will consider medication. Informed her to fax UA and urine culture when they receive. Also, informed her that we have faxed instruction and recommendations to them at [phone].

## 2014-09-05 NOTE — Telephone Encounter (Signed)
Please get UA and a urine culture. Dx mental status changes Be sure the patient is not constipated, if no bowel movements in the last 2-3 days let me know If fever, chills, cough: needs to be seen Watch him for the next 2 or 3 days, if he has increased agitation will consider medication or otherwise reassurance and redirection are appropriate

## 2014-09-05 NOTE — Telephone Encounter (Signed)
Fax sent for scanning into chart.

## 2014-09-05 NOTE — Telephone Encounter (Signed)
Received fax from [facility] stating "patient has had increased agitation after dinner time for the past two days. Yelling, arguing with staff. No physical aggression, only verbal. Please advise."   I placed fax in red folder. JG//CMA

## 2014-09-15 ENCOUNTER — Telehealth: Payer: Self-pay | Admitting: Internal Medicine

## 2014-09-15 NOTE — Telephone Encounter (Signed)
Form faxed back to [facility] at [phone]. Carvedilol increased in chart. Forms sent for scanning.

## 2014-09-15 NOTE — Telephone Encounter (Signed)
Fax from his residency reviewed, BP is elevated in the last several days, systolic BP consistently around 170. Heart rate has been in the 70s, 80s. Plan: Increase carvedilol from 12.5 twice a day to 25 mg twice a day. Will fax orders

## 2014-09-17 ENCOUNTER — Other Ambulatory Visit: Payer: Self-pay | Admitting: Internal Medicine

## 2014-09-21 ENCOUNTER — Other Ambulatory Visit: Payer: Self-pay | Admitting: Internal Medicine

## 2014-09-28 ENCOUNTER — Telehealth: Payer: Self-pay | Admitting: Internal Medicine

## 2014-09-28 NOTE — Telephone Encounter (Signed)
[facility] results. Informed them that we have received UA results but not culture results. UA results sent for scanning.

## 2014-09-28 NOTE — Telephone Encounter (Signed)
Urine culture was recently requested, I received a Udip reports 09/19/2014 from [facility] but I haven't seen the urine culture. Please good report

## 2014-10-03 ENCOUNTER — Other Ambulatory Visit: Payer: Self-pay | Admitting: Internal Medicine

## 2014-10-11 ENCOUNTER — Other Ambulatory Visit: Payer: Self-pay

## 2014-10-11 ENCOUNTER — Encounter: Payer: Self-pay | Admitting: Internal Medicine

## 2014-11-14 ENCOUNTER — Ambulatory Visit: Payer: Medicare Other | Admitting: Neurology

## 2014-11-24 ENCOUNTER — Other Ambulatory Visit: Payer: Self-pay | Admitting: Neurology

## 2014-11-27 ENCOUNTER — Ambulatory Visit: Payer: Medicare Other | Admitting: Internal Medicine

## 2014-11-28 ENCOUNTER — Encounter: Payer: Self-pay | Admitting: Neurology

## 2014-11-28 ENCOUNTER — Ambulatory Visit (INDEPENDENT_AMBULATORY_CARE_PROVIDER_SITE_OTHER): Payer: Medicare Other | Admitting: Neurology

## 2014-11-28 VITALS — BP 160/92 | HR 80 | Ht 66.0 in | Wt 170.0 lb

## 2014-11-28 DIAGNOSIS — G309 Alzheimer's disease, unspecified: Secondary | ICD-10-CM

## 2014-11-28 DIAGNOSIS — F0150 Vascular dementia without behavioral disturbance: Secondary | ICD-10-CM | POA: Diagnosis not present

## 2014-11-28 DIAGNOSIS — F0280 Dementia in other diseases classified elsewhere without behavioral disturbance: Secondary | ICD-10-CM | POA: Diagnosis not present

## 2014-11-28 NOTE — Patient Instructions (Signed)
1.  Continue Namenda 10mg  twice daily 2.  Try to optimize blood sugar control 3.  Continue Plavix 4.  Follow up in 6 months.

## 2014-11-28 NOTE — Progress Notes (Signed)
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  He is accompanied by his wife, who provides some of the history.  Records and labs reviewed.  UPDATE: He is taking Namenda 10mg  twice daily.  He is taking Plavix for secondary stroke prevention.  There has been no changes since last visit.  He is occasionally agitated towards his wife, but it is not too bad and occurs infrequently.  Hgb A1c from January was 7.8.  HISTORY: His wife first noticed cognitive problems approximately 5 years ago. At first, he would leave doors open, such as the house, car, and freezer. He would often loses items as well. Sometimes he would leave the stove on. He was still driving and was still able to participate in the household finances. He was diagnosed with dementia approximately one and a half years ago. However, he refused to start any medications such as Aricept. [facility] after he was found on the floor by his wife after she heard a thud.  He was awake but not following commands.  He was found to have left-sided gaze, slurred speech and right-sided weakness.  Systolic blood pressure was in the 190s.  CBG was 131.  CT of head revealed mild diffuse atrophy with compensatory ventriculomegaly but no acute intracranial abnormality.  MRI of the brain without contrast revealed small acute infarcts involving the left caudate/corona radiate and left thalamus.  CTA of the head and neck revealed moderate to high-grade calcific stenosis within the cavernous carotid arteries bilaterally.  There is also dense atherosclerotic calcifications at the carotid bifurcations bilaterally without definite stenosis on either side. 2. Tortuosity of the distal right ICA and  medial deviation of the proximal left ICA, 1-39% right ICA stenosis and 40-59% left ICA stenosis.  2D echo revealed LVEF 55-60% and grade 1 diastolic dysfunction with no source of emboli.  Hgb A1c was 6.9.  LDL was 98.  B12 was 412.  There has been a significant progression in his cognitive deficits since the stroke. At first, he had trouble recognizing familiar people but this has since improved. However, he now is unable to participate in household finances or filing taxes. He is more irritable and easily agitated. Christian Erickson is a retired [employer] in the [employer], who served in both the [place] and [place].  He suffers from PTSD. Since the stroke, he previously exhibited delusions of still participating in [employer] and Compan[employer]il[employer] life. Sometimes he thought he was going on missions. He is no longer driving. It should be noted that his sister had Alzheimer's disease. As far as physical deficits from the stroke, such as right-sided weakness, this has resolved.  [facility] lives at [facility].  PAST MEDICAL HISTORY: Past Medical History  Diagnosis Date  . BPH (benign prostatic hypertrophy)   . Diabetes mellitus   . Hypertension   . Hyperlipidemia   . Diverticulitis   . ED (erectile dysfunction)   . Glaucoma     sees eye doctor routinely  . Allergic rhinitis   . Subclinical hypothyroidism   . Arthritis   . Blood dyscrasia     MEDICATIONS: Current Outpatient Prescriptions on File Prior to Visit  Medication Sig Dispense Refill  . acetaminophen (TYLENOL) 500 MG tablet Take 1,000 mg by  mouth every 6 (six) hours as needed (pain).    [name] atorvastatin (LIPITOR) 40 MG tablet TAKE 1 TABLET DAILY AT 6 P.M. 90 tablet 2  . bimatoprost (LUMIGAN) 0.01 % SOLN Place 1 drop into both eyes at bedtime.    . carvedilol (COREG) 12.5 MG tablet Take 1 tablet (12.5 mg total) by mouth 2 (two) times daily with a meal. (Patient taking differently: Take 25 mg by mouth 2 (two) times daily with a meal. ) 180 tablet 2  .  clopidogrel (PLAVIX) 75 MG tablet Take 1 tablet (75 mg total) by mouth daily with breakfast. 90 tablet 3  . EPIPEN 2-PAK 0.3 MG/0.3ML SOAJ injection use as directed FOR LIFE-THREATENING ALLERGIC REACTIONS 2 Device 1  . fluticasone (FLONASE) 50 MCG/ACT nasal spray Place 2 sprays into both nostrils daily as needed for allergies or rhinitis. 16 g 3  . folic acid (FOLVITE) 1 MG tablet take 1 tablet by mouth once daily 30 tablet 5  . levothyroxine (SYNTHROID, LEVOTHROID) 25 MCG tablet TAKE 1 TABLET DAILY BEFORE BREAKFAST 90 tablet 1  . Loperamide HCl (IMODIUM A-D) 1 MG/7.5ML LIQD Take 30 mLs by mouth 3 (three) times daily as needed.    . metFORMIN (GLUCOPHAGE) 500 MG tablet TAKE 1 TABLET TWO TIMES DAILY WITH MEALS 180 tablet 2  . Multiple Vitamin (MULTIVITAMIN WITH MINERALS) TABS tablet Take 1 tablet by mouth daily.    [name] NAMENDA 10 MG tablet TAKE 1 TABLET TWICE A DAY 180 tablet 5  . ONGLYZA 5 MG TABS tablet TAKE 1 TABLET DAILY 90 tablet 2  . senna-docusate (SENOKOT-S) 8.6-50 MG per tablet Take 1 tablet by mouth at bedtime as needed for moderate constipation.    . tamsulosin (FLOMAX) 0.4 MG CAPS capsule Take 1 capsule (0.4 mg total) by mouth daily. 90 capsule 3  . thiamine 100 MG tablet Take 1 tablet (100 mg total) by mouth daily. 30 tablet 6  . zolpidem (AMBIEN) 5 MG tablet take 1 tablet by mouth at bedtime if needed for sleep 30 tablet 2  . [DISCONTINUED] cloNIDine (CATAPRES) 0.2 MG tablet Take 0.2 mg by mouth 2 (two) times daily.     No current facility-administered medications on file prior to visit.    ALLERGIES: Allergies  Allergen Reactions  . Beef-Derived Products Swelling  . Pork-Derived Products Swelling  . Vasotec     Angioedema, see OV 09-05-11    FAMILY HISTORY: Family History  Problem Relation Age of Onset  . Lupus Sister   . Stroke Brother 5    CVA, no FH premature CAD  . Diabetes Other     granddaughter  . Colon cancer Neg Hx   . Prostate cancer Other     cousin, age 80   . CAD Mother     "enlarged heart"    SOCIAL HISTORY: History   Social History  . Marital Status: Married    Spouse Name: N/A  . Number of Children: 2  . Years of Education: N/A   Occupational History  . Retired-- Hotel manager, [employer]    Social History Main Topics  . Smoking status: Former Smoker    Types: Cigarettes  . Smokeless tobacco: Never Used     Comment: used to smoke rarely  . Alcohol Use: 0.0 oz/week    0 Standard drinks or equivalent per week     Comment: rarely  . Drug Use: No  . Sexual Activity: No   Other Topics Concern  . Not on file  Social History Narrative   Wife had CABG 2011   Married x > 44 years, children x 2 , 2 G daughters   Lives at the memory unit ([facility] as of 09-2013)             REVIEW OF SYSTEMS: Constitutional: No fevers, chills, or sweats, no generalized fatigue, change in appetite Eyes: No visual changes, double vision, eye pain Ear, nose and throat: No hearing loss, ear pain, nasal congestion, sore throat Cardiovascular: No chest pain, palpitations Respiratory:  No shortness of breath at rest or with exertion, wheezes GastrointestinaI: No nausea, vomiting, diarrhea, abdominal pain, fecal incontinence Genitourinary:  No dysuria, urinary retention or frequency Musculoskeletal:  No neck pain, back pain Integumentary: No rash, pruritus, skin lesions Neurological: as above Psychiatric: No depression, insomnia, anxiety Endocrine: No palpitations, fatigue, diaphoresis, mood swings, change in appetite, change in weight, increased thirst Hematologic/Lymphatic:  No anemia, purpura, petechiae. Allergic/Immunologic: no itchy/runny eyes, nasal congestion, recent allergic reactions, rashes  PHYSICAL EXAM: Filed Vitals:   11/28/14 1357  BP: 160/92  Pulse: 80   General: No acute distress Head:  Normocephalic/atraumatic Eyes:  Fundi not visualized. Neck: supple, no paraspinal tenderness, full range of motion Heart:  Regular  rate and rhythm Lungs:  Clear to auscultation bilaterally Back: No paraspinal tenderness Neurological Exam: alert and oriented to self and place, not to time (except month). Attention span and concentration poor, delayed recall poor, remote memory intact, fund of knowledge poor.  He does not know the current president or candidates running for office.  Speech fluent and not dysarthric, language intact.   CN II-XII intact. Fundi not visualized.  Bulk and tone normal, muscle strength 5/5 throughout.  Sensation to light touch intact.  Deep tendon reflexes 2+ throughout.  Finger to nose  intact.  Gait normal.  IMPRESSION: Mixed vascular and Alzheimer's dementia  PLAN: 1.  Continue Namenda  twice daily 2.  Plavix for secondary stroke prevention 3.  Continue Lipitor (LDL goal less than 70) 4.  Optimize blood glucose control and blood pressure 5.  Follow up in 6 months.  Shon Millet, DO  CC:  Willow Ora, MD

## 2014-11-30 ENCOUNTER — Other Ambulatory Visit: Payer: Self-pay | Admitting: Internal Medicine

## 2014-12-12 ENCOUNTER — Ambulatory Visit (INDEPENDENT_AMBULATORY_CARE_PROVIDER_SITE_OTHER): Payer: Medicare Other | Admitting: Internal Medicine

## 2014-12-12 ENCOUNTER — Encounter: Payer: Self-pay | Admitting: Internal Medicine

## 2014-12-12 VITALS — BP 124/84 | HR 50 | Temp 97.6°F | Ht 66.0 in | Wt 166.2 lb

## 2014-12-12 DIAGNOSIS — E038 Other specified hypothyroidism: Secondary | ICD-10-CM | POA: Diagnosis not present

## 2014-12-12 DIAGNOSIS — E785 Hyperlipidemia, unspecified: Secondary | ICD-10-CM

## 2014-12-12 DIAGNOSIS — E119 Type 2 diabetes mellitus without complications: Secondary | ICD-10-CM

## 2014-12-12 DIAGNOSIS — I10 Essential (primary) hypertension: Secondary | ICD-10-CM | POA: Diagnosis not present

## 2014-12-12 DIAGNOSIS — E1159 Type 2 diabetes mellitus with other circulatory complications: Secondary | ICD-10-CM

## 2014-12-12 MED ORDER — LEVOTHYROXINE SODIUM 25 MCG PO TABS: 25.0000 ug | ORAL_TABLET | Freq: Every day | ORAL | Status: DC

## 2014-12-12 NOTE — Progress Notes (Signed)
Pre visit review using our clinic review tool, if applicable. No additional management support is needed unless otherwise documented below in the visit note. 

## 2014-12-12 NOTE — Progress Notes (Signed)
Subjective:    Patient ID: Christian Erickson, male    DOB: 05/30/1931, 81 y.o.   MRN: [id number]  DOS:  12/12/2014 Type of visit - description : rov, here w/ wife Interval history: Patient advised no problem. Diabetes good compliance with medication, ambulatory CBGs are check at the facility in the morning 112, 125, 167. In the afternoon 160, 87, 154. Hypertension, good compliance with medication, BP today is very good Dementia, lives in a memory unit, no apparent problems, just saw neurology , he got good reports.    Review of Systems  Appetite remains very good Denies chest pain, difficulty breathing No nausea, vomiting, diarrhea Wife reports that he is doing well, no recent accidents or falls.  Past Medical History  Diagnosis Date  . BPH (benign prostatic hypertrophy)   . Diabetes mellitus   . Hypertension   . Hyperlipidemia   . Diverticulitis   . ED (erectile dysfunction)   . Glaucoma     sees eye doctor routinely  . Allergic rhinitis   . Subclinical hypothyroidism   . Arthritis   . Blood dyscrasia     Past Surgical History  Procedure Laterality Date  . Inguinal hernia repair    . Colectomy      w/ reversal due to divertiuclitis per patient in 1990s aprox  . Sbo repair    . Total knee arthroplasty Right 10/27/2012    Procedure: TOTAL KNEE ARTHROPLASTY;  Surgeon: Loreta Ave, MD;  Location: [facility] OR;  Service: Orthopedics;  Laterality: Right;    History   Social History  . Marital Status: Married    Spouse Name: N/A  . Number of Children: 2  . Years of Education: N/A   Occupational History  . Retired-- Hotel manager, [employer]    Social History Main Topics  . Smoking status: Former Smoker    Types: Cigarettes  . Smokeless tobacco: Never Used     Comment: used to smoke rarely  . Alcohol Use: 0.0 oz/week    0 Standard drinks or equivalent per week     Comment: rarely  . Drug Use: No  . Sexual Activity: No   Other Topics Concern  . Not on file    Social History Narrative   Wife had CABG 2011   Married x > 44 years, children x 2 , 2 G daughters   Lives at the memory unit ([facility] as of 09-2013)                 Medication List       This list is accurate as of: 12/12/14 11:59 PM.  Always use your most recent med list.               acetaminophen 500 MG tablet  Commonly known as:  TYLENOL  Take 1,000 mg by mouth every 6 (six) hours as needed (pain).     atorvastatin 40 MG tablet  Commonly known as:  LIPITOR  TAKE 1 TABLET DAILY AT 6 P.M.     bimatoprost 0.01 % Soln  Commonly known as:  LUMIGAN  Place 1 drop into both eyes at bedtime.     carvedilol 12.5 MG tablet  Commonly known as:  COREG  Take 1 tablet (12.5 mg total) by mouth 2 (two) times daily with a meal.     clopidogrel 75 MG tablet  Commonly known as:  PLAVIX  Take 1 tablet (75 mg total) by mouth daily with breakfast.     EPIPEN  2-PAK 0.3 mg/0.3 mL Soaj injection  Generic drug:  EPINEPHrine  use as directed FOR LIFE-THREATENING ALLERGIC REACTIONS     fluticasone 50 MCG/ACT nasal spray  Commonly known as:  FLONASE  Place 2 sprays into both nostrils daily as needed for allergies or rhinitis.     folic acid 1 MG tablet  Commonly known as:  FOLVITE  take 1 tablet by mouth once daily     IMODIUM A-D 1 MG/7.5ML Liqd  Generic drug:  Loperamide HCl  Take 30 mLs by mouth 3 (three) times daily as needed.     levothyroxine 25 MCG tablet  Commonly known as:  SYNTHROID, LEVOTHROID  Take 1 tablet (25 mcg total) by mouth daily before breakfast.     metFORMIN 500 MG tablet  Commonly known as:  GLUCOPHAGE  TAKE 1 TABLET TWO TIMES DAILY WITH MEALS     multivitamin with minerals Tabs tablet  Take 1 tablet by mouth daily.     NAMENDA 10 MG tablet  Generic drug:  memantine  TAKE 1 TABLET TWICE A DAY     ONGLYZA 5 MG Tabs tablet  Generic drug:  saxagliptin HCl  TAKE 1 TABLET DAILY     senna-docusate 8.6-50 MG per tablet  Commonly known as:   Senokot-S  Take 1 tablet by mouth at bedtime as needed for moderate constipation.     tamsulosin 0.4 MG Caps capsule  Commonly known as:  FLOMAX  Take 1 capsule (0.4 mg total) by mouth daily.     thiamine 100 MG tablet  Take 1 tablet (100 mg total) by mouth daily.     zolpidem 5 MG tablet  Commonly known as:  AMBIEN  take 1 tablet by mouth at bedtime if needed for sleep           Objective:   Physical Exam BP 124/84 mmHg  Pulse 50  Temp(Src) 97.6 F (36.4 C) (Oral)  Ht 5\' 6"  (1.676 m)  Wt 166 lb 4 oz (75.411 kg)  BMI 26.85 kg/m2  SpO2 97% General:   Well developed, well nourished . NAD.  HEENT:  Normocephalic . Face symmetric, atraumatic Lungs:  CTA B Normal respiratory effort, no intercostal retractions, no accessory muscle use. Heart: RRR,  no murmur.  No pretibial edema bilaterally  Skin: Not pale. Not jaundice Neurologic:  alert , cooperative.  Speech normal, gait appropriate for age and unassisted Psych--  pleasently demented.  Behavior appropriate. No anxious or depressed appearing.       Assessment & Plan:    Hypothyroidism, check a TSH, refill Synthroid  Hypertension, no change, check a BMP.  Primary care, we are out of Prevnar, will try and the next opportunity

## 2014-12-12 NOTE — Patient Instructions (Signed)
Get your blood work before you leave    

## 2014-12-13 LAB — LIPID PANEL
CHOLESTEROL: 105 mg/dL (ref 0–200)
HDL: 30.4 mg/dL — ABNORMAL LOW (ref >39.00)
LDL CALC: 62 mg/dL (ref 0–99)
NonHDL: 74.6
Total CHOL/HDL Ratio: 3
Triglycerides: 63 mg/dL (ref 0.0–149.0)
VLDL: 12.6 mg/dL (ref 0.0–40.0)

## 2014-12-13 LAB — TSH: TSH: 2.66 u[IU]/mL (ref 0.35–4.50)

## 2014-12-13 LAB — BASIC METABOLIC PANEL
BUN: 18 mg/dL (ref 6–23)
CALCIUM: 9.2 mg/dL (ref 8.4–10.5)
CHLORIDE: 102 meq/L (ref 96–112)
CO2: 28 mEq/L (ref 19–32)
Creatinine, Ser: 1.15 mg/dL (ref 0.40–1.50)
GFR: 78.26 mL/min (ref >60.00)
Glucose, Bld: 123 mg/dL — ABNORMAL HIGH (ref 70–99)
POTASSIUM: 4 meq/L (ref 3.5–5.1)
SODIUM: 138 meq/L (ref 135–145)

## 2014-12-13 LAB — HEMOGLOBIN A1C: Hgb A1c MFr Bld: 7.1 % — ABNORMAL HIGH (ref 4.6–6.5)

## 2014-12-13 LAB — ALT: ALT: 21 U/L (ref 0–53)

## 2014-12-13 LAB — AST: AST: 23 U/L (ref 0–37)

## 2014-12-13 NOTE — Assessment & Plan Note (Signed)
Good compliance with medications, will check A1c. I would like to see his A1c close to 6.5 however he is 84, has dementia, I will try to avoid hypoglycemia. Based on the results, most likely we will be able to increase metformin, also if the A1c is more than 8 , consider a low dose of glimepiride.

## 2014-12-13 NOTE — Assessment & Plan Note (Signed)
  On the statins, patient is not fasting but is very difficult for him to return to the office consequently we will go ahead and check a cholesterol panel

## 2014-12-19 ENCOUNTER — Other Ambulatory Visit: Payer: Self-pay | Admitting: Internal Medicine

## 2014-12-19 ENCOUNTER — Telehealth: Payer: Self-pay | Admitting: Internal Medicine

## 2014-12-19 ENCOUNTER — Other Ambulatory Visit: Payer: Self-pay

## 2014-12-19 MED ORDER — CARVEDILOL 12.5 MG PO TABS: 12.5000 mg | ORAL_TABLET | Freq: Two times a day (BID) | ORAL | Status: DC

## 2014-12-19 NOTE — Telephone Encounter (Signed)
30 day supply sent to [facility], and 90 day supply with 1 refill sent to [facility].

## 2014-12-19 NOTE — Telephone Encounter (Signed)
Caller name: Goswick,[name] Relation to pt:  Spouse  Call back number: [phone] Pharmacy:[facility] [phone] / [facility]  Reason for call:  Spouse called stating pt is completely out of carvedilol (COREG) 12.5 MG tablet please send a 3 week supply to [facility] [phone] fax # [phone]. Please send a 90 day supply to [facility]. Please advise

## 2014-12-26 ENCOUNTER — Other Ambulatory Visit: Payer: Self-pay

## 2015-01-15 ENCOUNTER — Other Ambulatory Visit: Payer: Self-pay

## 2015-01-15 ENCOUNTER — Telehealth: Payer: Self-pay

## 2015-01-15 MED ORDER — FOLIC ACID 1 MG PO TABS: 1.0000 mg | ORAL_TABLET | Freq: Every day | ORAL | Status: DC

## 2015-01-15 MED ORDER — EPINEPHRINE 0.3 MG/0.3ML IJ SOAJ: 0.3000 mg | Freq: Once | INTRAMUSCULAR | Status: AC

## 2015-01-15 MED ORDER — FLUTICASONE PROPIONATE 50 MCG/ACT NA SUSP: 2.0000 | Freq: Every day | NASAL | Status: DC | PRN

## 2015-01-15 MED ORDER — ZOLPIDEM TARTRATE 5 MG PO TABS: 5.0000 mg | ORAL_TABLET | Freq: Every evening | ORAL | Status: DC | PRN

## 2015-01-15 NOTE — Telephone Encounter (Signed)
Pt is requesting refill on Ambien.  Last OV: 12/12/2014 Last Fill: 07/24/2014 #[phone] [phone]  Please advise.

## 2015-01-15 NOTE — Telephone Encounter (Signed)
Rx printed, awaiting MD signature.  

## 2015-01-15 NOTE — Telephone Encounter (Signed)
Okay 30 and 2 refills 

## 2015-01-15 NOTE — Telephone Encounter (Signed)
Rx faxed to [facility].

## 2015-02-05 DIAGNOSIS — Z0279 Encounter for issue of other medical certificate: Secondary | ICD-10-CM

## 2015-02-06 ENCOUNTER — Telehealth: Payer: Self-pay | Admitting: *Deleted

## 2015-02-06 NOTE — Telephone Encounter (Signed)
Received attending physician's statement paperwork from [facility]. Filled out as much as possible and forwarded to Dr. Drue Novel. JG//CMA

## 2015-02-07 NOTE — Telephone Encounter (Signed)
Completed forms mailed to pt's home address. Copy sent for scanning. JG//CMA  

## 2015-03-21 ENCOUNTER — Emergency Department (HOSPITAL_COMMUNITY): Payer: Medicare Other

## 2015-03-21 ENCOUNTER — Encounter (HOSPITAL_COMMUNITY): Payer: Self-pay | Admitting: Emergency Medicine

## 2015-03-21 ENCOUNTER — Emergency Department (HOSPITAL_COMMUNITY)
Admission: EM | Admit: 2015-03-21 | Discharge: 2015-03-21 | Disposition: A | Discharge status: Home / Self Care | Payer: Medicare Other | Attending: Emergency Medicine | Admitting: Emergency Medicine

## 2015-03-21 DIAGNOSIS — E119 Type 2 diabetes mellitus without complications: Secondary | ICD-10-CM | POA: Diagnosis not present

## 2015-03-21 DIAGNOSIS — M1990 Unspecified osteoarthritis, unspecified site: Secondary | ICD-10-CM | POA: Diagnosis not present

## 2015-03-21 DIAGNOSIS — I10 Essential (primary) hypertension: Secondary | ICD-10-CM | POA: Diagnosis not present

## 2015-03-21 DIAGNOSIS — Y998 Other external cause status: Secondary | ICD-10-CM | POA: Diagnosis not present

## 2015-03-21 DIAGNOSIS — S0990XA Unspecified injury of head, initial encounter: Secondary | ICD-10-CM | POA: Insufficient documentation

## 2015-03-21 DIAGNOSIS — W208XXA Other cause of strike by thrown, projected or falling object, initial encounter: Secondary | ICD-10-CM | POA: Diagnosis not present

## 2015-03-21 DIAGNOSIS — G309 Alzheimer's disease, unspecified: Secondary | ICD-10-CM | POA: Diagnosis not present

## 2015-03-21 DIAGNOSIS — Y929 Unspecified place or not applicable: Secondary | ICD-10-CM | POA: Insufficient documentation

## 2015-03-21 DIAGNOSIS — E785 Hyperlipidemia, unspecified: Secondary | ICD-10-CM | POA: Diagnosis not present

## 2015-03-21 DIAGNOSIS — Z87891 Personal history of nicotine dependence: Secondary | ICD-10-CM | POA: Diagnosis not present

## 2015-03-21 DIAGNOSIS — Z7902 Long term (current) use of antithrombotics/antiplatelets: Secondary | ICD-10-CM | POA: Diagnosis not present

## 2015-03-21 DIAGNOSIS — F0280 Dementia in other diseases classified elsewhere without behavioral disturbance: Secondary | ICD-10-CM | POA: Diagnosis not present

## 2015-03-21 DIAGNOSIS — Y9389 Activity, other specified: Secondary | ICD-10-CM | POA: Diagnosis not present

## 2015-03-21 DIAGNOSIS — Z79899 Other long term (current) drug therapy: Secondary | ICD-10-CM | POA: Insufficient documentation

## 2015-03-21 HISTORY — DX: Dementia in other diseases classified elsewhere, unspecified severity, without behavioral disturbance, psychotic disturbance, mood disturbance, and anxiety: F02.80

## 2015-03-21 HISTORY — DX: Alzheimer's disease, unspecified: G30.9

## 2015-03-21 LAB — CBG MONITORING, ED: Glucose-Capillary: 135 mg/dL — ABNORMAL HIGH (ref 65–99)

## 2015-03-21 MED ORDER — ACETAMINOPHEN 325 MG PO TABS
325.0000 mg | ORAL_TABLET | Freq: Once | ORAL | Status: AC
  Administered 2015-03-21: 325 mg via ORAL
  Filled 2015-03-21: qty 1
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
## 2015-03-21 NOTE — Discharge Instructions (Signed)
1. Medications: usual home medications 2. Treatment: rest, drink plenty of fluids 3. Follow Up: please followup with your primary doctor for discussion of your diagnoses and further evaluation after today's visit; if you do not have a primary care doctor use the resource guide provided to find one; please return to the ER for headache, lightheadedness, dizziness, loss of consciousness, vision changes, persistent vomiting    Head Injury You have a head injury. Headaches and throwing up (vomiting) are common after a head injury. It should be easy to wake up from sleeping. Sometimes you must stay in the hospital. Most problems happen within the first 24 hours. Side effects may occur up to 7-10 days after the injury.  WHAT ARE THE TYPES OF HEAD INJURIES? Head injuries can be as minor as a bump. Some head injuries can be more severe. More severe head injuries include:  A jarring injury to the brain (concussion).  A bruise of the brain (contusion). This mean there is bleeding in the brain that can cause swelling.  A cracked skull (skull fracture).  Bleeding in the brain that collects, clots, and forms a bump (hematoma). WHEN SHOULD I GET HELP RIGHT AWAY?   You are confused or sleepy.  You cannot be woken up.  You feel sick to your stomach (nauseous) or keep throwing up (vomiting).  Your dizziness or unsteadiness is getting worse.  You have very bad, lasting headaches that are not helped by medicine. Take medicines only as told by your doctor.  You cannot use your arms or legs like normal.  You cannot walk.  You notice changes in the black spots in the center of the colored part of your eye (pupil).  You have clear or bloody fluid coming from your nose or ears.  You have trouble seeing. During the next 24 hours after the injury, you must stay with someone who can watch you. This person should get help right away (call 911 in the [place] if you start to shake and are not able to control  it (have seizures), you pass out, or you are unable to wake up. HOW CAN I PREVENT A HEAD INJURY IN THE FUTURE?  Wear seat belts.  Wear a helmet while bike riding and playing sports like football.  Stay away from dangerous activities around the house. WHEN CAN I RETURN TO NORMAL ACTIVITIES AND ATHLETICS? See your doctor before doing these activities. You should not do normal activities or play contact sports until 1 week after the following symptoms have stopped:  Headache that does not go away.  Dizziness.  Poor attention.  Confusion.  Memory problems.  Sickness to your stomach or throwing up.  Tiredness.  Fussiness.  Bothered by bright lights or loud noises.  Anxiousness or depression.  Restless sleep. MAKE SURE YOU:   Understand these instructions.  Will watch your condition.  Will get help right away if you are not doing well or get worse. Document Released: 06/05/2008 Document Revised: 11/07/2013 Document Reviewed: 02/28/2013 [facility] Patient Information 2015 [place], [place]. This information is not intended to replace advice given to you by your health care provider. Make sure you discuss any questions you have with your health care provider.
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
## 2015-03-27 ENCOUNTER — Other Ambulatory Visit: Payer: Self-pay | Admitting: Internal Medicine

## 2015-04-03 LAB — BASIC METABOLIC PANEL
BUN: 18 mg/dL (ref 4–21)
Creatinine: 1.2 mg/dL (ref 0.6–1.3)

## 2015-04-13 ENCOUNTER — Other Ambulatory Visit: Payer: Self-pay

## 2015-04-16 ENCOUNTER — Telehealth: Payer: Self-pay | Admitting: Internal Medicine

## 2015-04-16 ENCOUNTER — Ambulatory Visit: Payer: Medicare Other | Admitting: Internal Medicine

## 2015-04-16 NOTE — Telephone Encounter (Signed)
No thx

## 2015-04-16 NOTE — Telephone Encounter (Signed)
Pt will no show today 04/16/15 2:15pm. Pt is at assisted living. They called 2:08pm. Pt refusing to get on the bus to the staff and his wife. Pt has alzheimer's and is not having a good day. Charge for no show?

## 2015-04-18 ENCOUNTER — Telehealth: Payer: Self-pay | Admitting: Internal Medicine

## 2015-04-18 ENCOUNTER — Other Ambulatory Visit: Payer: Self-pay | Admitting: Internal Medicine

## 2015-04-18 NOTE — Telephone Encounter (Signed)
[facility] [phone]. Also instructed them that Pt's wife needs to call and reschedule missed appointment from 04/16/2015. Received fax confirmation on 04/18/2015 at 1309. Physician recommendation sheet sent for scanning.

## 2015-04-18 NOTE — Telephone Encounter (Signed)
Fax from his residence: Report the patient has increased agitation. No other information provided. Chart reviewed, had a head injury last month, CT head was negative at the ER. Recommend a UA and urine culture to rule out UTI Also consider use of a low dose Xanax or similar medication if they feel that medication is needed

## 2015-04-20 ENCOUNTER — Telehealth: Payer: Self-pay | Admitting: *Deleted

## 2015-04-20 NOTE — Telephone Encounter (Signed)
PA for Onglyza 5 mg tablets approved effective 03/21/2015 - 07/06/2098.

## 2015-04-24 ENCOUNTER — Encounter: Payer: Self-pay | Admitting: Internal Medicine

## 2015-04-25 NOTE — Telephone Encounter (Signed)
Addendum: UA 04/19/2015 negative. I don't see a report for the culture

## 2015-04-30 ENCOUNTER — Ambulatory Visit: Payer: Medicare Other | Admitting: Internal Medicine

## 2015-05-07 ENCOUNTER — Telehealth: Payer: Self-pay | Admitting: Internal Medicine

## 2015-05-07 ENCOUNTER — Telehealth: Payer: Self-pay | Admitting: *Deleted

## 2015-05-07 ENCOUNTER — Ambulatory Visit: Payer: Medicare Other | Admitting: Internal Medicine

## 2015-05-07 MED ORDER — ALPRAZOLAM 0.25 MG PO TABS: 0.2500 mg | ORAL_TABLET | Freq: Every evening | ORAL | Status: DC | PRN

## 2015-05-07 NOTE — Telephone Encounter (Signed)
As per [facility] patient is having anxiety due to appointment and had to cancel 2pm appointment for today. Charge or no charge

## 2015-05-07 NOTE — Telephone Encounter (Signed)
[facility]. Forwarded to Dr. Drue NovelPaz. JG//CMA

## 2015-05-07 NOTE — Telephone Encounter (Signed)
Caller name: Brittney  Relation to pt: [facility] Call back number: [phone]   Reason for call:  Inquiring about a anxiety medication that patient can take prior to appointments. Please advise directly

## 2015-05-07 NOTE — Telephone Encounter (Signed)
Please advise. Or if [facility] has the option of an MD to round there to see him instead of coming here?

## 2015-05-07 NOTE — Telephone Encounter (Signed)
Received fax confirmation on 05/07/2015 at 4:41 PM [facility].

## 2015-05-07 NOTE — Telephone Encounter (Signed)
If they think is necessary, okay to prescribe a small dose of Xanax as needed. If they have the option to have a practitioner to round on him that would be optimal.

## 2015-05-07 NOTE — Telephone Encounter (Signed)
Received fax confirmation on 05/07/2015 at 2:52 PM.

## 2015-05-07 NOTE — Telephone Encounter (Signed)
Caller name:Brittney  :  Pharmacy:[facility] [address], [place], [place] [address]  [phone]   Reason for call:  Philippa ChesterBrittney called back and stated they spoke with patient wife and there requesting the Xanax [facility] [place] [id number]  And please fax RX to [facility]  fax # [phone] Philippa ChesterBrittney stated they need orders on file regarding medication

## 2015-05-07 NOTE — Telephone Encounter (Signed)
Xanax 0.25 mg 1 tablet by mouth daily PRN, #30 and 0RF, printed, awaiting MD signature.

## 2015-05-07 NOTE — Addendum Note (Signed)
Addended by: Dorette GrateFAULKNER, Dorrine Montone C on: 05/07/2015 04:35 PM   Modules accepted: Orders

## 2015-05-07 NOTE — Telephone Encounter (Signed)
Recommendations faxed to Brittney's attention at [phone] at [facility].

## 2015-05-07 NOTE — Telephone Encounter (Signed)
[facility] to [phone]. Rx also faxed to [facility] at [phone].

## 2015-05-07 NOTE — Telephone Encounter (Signed)
Tried calling [facility] to speak with Brittney, on hold for longer than 5 minutes, in clinic, unable to hold for unspecified amount of time. Will fax Dr. Drue NovelPaz recommendations. He recommends MD/PCP there if they have that option due to increasing anxiety and confusion to come to office visits here, or Xanax 0.25 mg 1 tablet by mouth daily prn.

## 2015-05-09 ENCOUNTER — Telehealth: Payer: Self-pay | Admitting: Internal Medicine

## 2015-05-09 NOTE — Telephone Encounter (Signed)
Christian LeschMelissa Hairston Ph# [phone] [facility]   General increase in agitation and pt is harder to redirect than before. Pt has an increase in accidents and has been having loose stools. Pt is scheduled 05/16/15. Pt gets defensive when he and his wife are in the office and talking in front of him but she wants Dr. Drue NovelPaz to be aware.

## 2015-05-09 NOTE — Telephone Encounter (Signed)
FYI
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
## 2015-05-11 NOTE — Telephone Encounter (Signed)
4th No show/cancellation in October 2016, charge no show fee.

## 2015-05-11 NOTE — Telephone Encounter (Signed)
Charge or no charge for 05/07/15?

## 2015-05-14 MED ORDER — ALPRAZOLAM 0.25 MG PO TABS: 0.2500 mg | ORAL_TABLET | Freq: Every day | ORAL | Status: DC | PRN

## 2015-05-14 NOTE — Telephone Encounter (Signed)
Christian Erickson says Xanax Rx says daily at bedtime and she needs it to say daily PRN so that med can be given to patient prior to doctor appts.

## 2015-05-14 NOTE — Telephone Encounter (Signed)
[facility] orders for patient to have Xanax prior to doctor appointment fax # [phone] and attention Brittney [phone].

## 2015-05-14 NOTE — Telephone Encounter (Signed)
Called pharmacy to inform them of the change to the directions.  Updated patient's medication list.  Printed medication list and faxed it to [facility].

## 2015-05-14 NOTE — Addendum Note (Signed)
Addended by: Tylene FantasiaLANGSTON, Iram Astorino S on: 05/14/2015 02:31 PM   Modules accepted: Orders, Medications

## 2015-05-16 ENCOUNTER — Ambulatory Visit: Payer: Medicare Other | Admitting: Internal Medicine

## 2015-05-19 ENCOUNTER — Other Ambulatory Visit: Payer: Self-pay | Admitting: Internal Medicine

## 2015-05-21 ENCOUNTER — Ambulatory Visit (INDEPENDENT_AMBULATORY_CARE_PROVIDER_SITE_OTHER): Payer: Medicare Other | Admitting: Internal Medicine

## 2015-05-21 ENCOUNTER — Encounter: Payer: Self-pay | Admitting: Internal Medicine

## 2015-05-21 VITALS — BP 126/78 | HR 68 | Temp 98.1°F | Ht 66.0 in | Wt 162.5 lb

## 2015-05-21 DIAGNOSIS — E119 Type 2 diabetes mellitus without complications: Secondary | ICD-10-CM

## 2015-05-21 DIAGNOSIS — Z09 Encounter for follow-up examination after completed treatment for conditions other than malignant neoplasm: Secondary | ICD-10-CM

## 2015-05-21 DIAGNOSIS — F0391 Unspecified dementia with behavioral disturbance: Secondary | ICD-10-CM | POA: Diagnosis not present

## 2015-05-21 DIAGNOSIS — I10 Essential (primary) hypertension: Secondary | ICD-10-CM | POA: Diagnosis not present

## 2015-05-21 DIAGNOSIS — E039 Hypothyroidism, unspecified: Secondary | ICD-10-CM | POA: Diagnosis not present

## 2015-05-21 DIAGNOSIS — F03918 Unspecified dementia, unspecified severity, with other behavioral disturbance: Secondary | ICD-10-CM

## 2015-05-21 LAB — BASIC METABOLIC PANEL
BUN: 22 mg/dL (ref 6–23)
CALCIUM: 10 mg/dL (ref 8.4–10.5)
CO2: 31 mEq/L (ref 19–32)
CREATININE: 1.11 mg/dL (ref 0.40–1.50)
Chloride: 103 mEq/L (ref 96–112)
GFR: 81.43 mL/min (ref >60.00)
Glucose, Bld: 111 mg/dL — ABNORMAL HIGH (ref 70–99)
Potassium: 4.5 mEq/L (ref 3.5–5.1)
Sodium: 141 mEq/L (ref 135–145)

## 2015-05-21 LAB — TSH: TSH: 2.85 u[IU]/mL (ref 0.35–4.50)

## 2015-05-21 LAB — HEMOGLOBIN A1C: HEMOGLOBIN A1C: 7.1 % — AB (ref 4.6–6.5)

## 2015-05-21 MED ORDER — DONEPEZIL HCL 5 MG PO TABS: 5.0000 mg | ORAL_TABLET | Freq: Every day | ORAL | Status: DC

## 2015-05-21 MED ORDER — CARVEDILOL 12.5 MG PO TABS: 12.5000 mg | ORAL_TABLET | Freq: Two times a day (BID) | ORAL | Status: DC

## 2015-05-21 NOTE — Progress Notes (Signed)
Pre visit review using our clinic review tool, if applicable. No additional management support is needed unless otherwise documented below in the visit note. 

## 2015-05-21 NOTE — Assessment & Plan Note (Signed)
DM: Continue with present care, CBGs are checked at the facility, had a single episode of low blood sugar. Check a A1c. HTN: BP very good today, check a BMP Hypothyroidism: due for a TSH Dementia with behavioral disturbances: The patient is more combative, recent UA was negative, ROS does not point to any triggers such as constipation or pain. Likely behavioral disturbances related to advancing of dementia. He is on Namenda, Xanax, occasionally takes an Ambien. Will do a trial with Aricept, recommend to watch for side effects. Disposition: Patient's wife would like to see the doctor of the facility, I think that is very good for them given the difficult time he has coming back here. I'll facilitate the transition any way we can. Primary care: Recommend to get a flu shot and Prevnar at the facility, instructions written

## 2015-05-21 NOTE — Patient Instructions (Signed)
Get your blood work before you leave   Start taking Aricept 5 mg every night; watch for somnolence or more confusion   Next visit: Please schedule a visit with the doctor at the facility that you are living ASAP.

## 2015-05-21 NOTE — Progress Notes (Signed)
Subjective:    Patient ID: Christian Erickson, male    DOB: October 02, 1930, 81 y.o.   MRN: [id number]  DOS:  05/21/2015 Type of visit - description : Routine visit, here with his wife Interval history: I have a private conversation with the wife, the patient is getting more combative, does not like to follow commands. They are having a very hard time getting him to come to the office. She wonders if he would be appropriate to see the doctor at the facility he resides. Medication list reviewed. Blood sugars reviewed: 110, 120, 107 in the morning, one episode of CBG of 57.   Review of Systems  no fever chills. Appetite is good  No recent fall or injury. Occasional diarrhea. No blood in the stools.  +bladder  incontinence and occ bowel incontinence  . The patient has not reported any problems with back pain or headache.   Past Medical History  Diagnosis Date  . BPH (benign prostatic hypertrophy)     nocturia  . Diabetes mellitus   . Hypertension   . Hyperlipidemia   . Diverticulitis   . ED (erectile dysfunction)   . Glaucoma     sees eye doctor routinely  . Allergic rhinitis   . Subclinical hypothyroidism   . Arthritis   . Blood dyscrasia   . Alzheimer's dementia     Past Surgical History  Procedure Laterality Date  . Inguinal hernia repair    . Colectomy      w/ reversal due to divertiuclitis per patient in 1990s aprox  . Sbo repair    . Total knee arthroplasty Right 10/27/2012    Procedure: TOTAL KNEE ARTHROPLASTY;  Surgeon: Loreta Aveaniel F Murphy, MD;  [facility] OR;  Service: Orthopedics;  Laterality: Right;    Social History   Social History  . Marital Status: Married    Spouse Name: N/A  . Number of Children: 2  . Years of Education: N/A   Occupational History  . Retired-- [employer], [employer]    Social History Main Topics  . Smoking status: Former Smoker    Types: Cigarettes  . Smokeless tobacco: Never Used     Comment: used to smoke rarely  . Alcohol Use:  0.0 oz/week    0 Standard drinks or equivalent per week     Comment: rarely  . Drug Use: No  . Sexual Activity: No   Other Topics Concern  . Not on file   Social History Narrative   Wife had CABG 2011   Married x > 44 years, children x 2 , 2 G daughters   Lives at the memory unit ([facility] as of 09-2013)                 Medication List       This list is accurate as of: 05/21/15  7:32 PM.  Always use your most recent med list.               acetaminophen 500 MG tablet  Commonly known as:  TYLENOL  Take 1,000 mg by mouth every 6 (six) hours as needed (pain).     ALPRAZolam 0.25 MG tablet  Commonly known as:  XANAX  Take 1 tablet (0.25 mg total) by mouth daily as needed for anxiety.     atorvastatin 40 MG tablet  Commonly known as:  LIPITOR  TAKE 1 TABLET DAILY AT 6 P.M.     bimatoprost 0.01 % Soln  Commonly known as:  LUMIGAN  Place 1 drop into both eyes at bedtime.     carvedilol 12.5 MG tablet  Commonly known as:  COREG  Take 1 tablet (12.5 mg total) by mouth 2 (two) times daily.     clopidogrel 75 MG tablet  Commonly known as:  PLAVIX  Take 1 tablet (75 mg total) by mouth daily with breakfast.     dextromethorphan-guaiFENesin 30-600 MG 12hr tablet  Commonly known as:  MUCINEX DM  Take 1 tablet by mouth 2 (two) times daily as needed for cough.     ROBAFEN DM 10-100 MG/5ML liquid  Generic drug:  Dextromethorphan-Guaifenesin  Take 10 mLs by mouth every 6 (six) hours as needed (Cough).     donepezil 5 MG tablet  Commonly known as:  ARICEPT  Take 1 tablet (5 mg total) by mouth at bedtime.     EPINEPHrine 0.3 mg/0.3 mL Soaj injection  Commonly known as:  EPIPEN 2-PAK  Inject 0.3 mLs (0.3 mg total) into the muscle once.     fluticasone 50 MCG/ACT nasal spray  Commonly known as:  FLONASE  Place 2 sprays into both nostrils daily as needed for allergies or rhinitis.     folic acid 1 MG tablet  Commonly known as:  FOLVITE  Take 1 tablet (1 mg  total) by mouth daily.     IMODIUM A-D 1 MG/7.5ML Liqd  Generic drug:  Loperamide HCl  Take 30 mLs by mouth 3 (three) times daily as needed.     levothyroxine 25 MCG tablet  Commonly known as:  SYNTHROID, LEVOTHROID  Take 1 tablet (25 mcg total) by mouth daily before breakfast.     metFORMIN 500 MG tablet  Commonly known as:  GLUCOPHAGE  Take 1 tablet (500 mg total) by mouth 2 (two) times daily with a meal.     multivitamin with minerals Tabs tablet  Take 1 tablet by mouth daily.     NAMENDA 10 MG tablet  Generic drug:  memantine  TAKE 1 TABLET TWICE A DAY     saxagliptin HCl 5 MG Tabs tablet  Commonly known as:  ONGLYZA  Take 1 tablet (5 mg total) by mouth daily.     senna-docusate 8.6-50 MG tablet  Commonly known as:  Senokot-S  Take 1 tablet by mouth at bedtime as needed for moderate constipation.     tamsulosin 0.4 MG Caps capsule  Commonly known as:  FLOMAX  Take 1 capsule (0.4 mg total) by mouth daily.     thiamine 100 MG tablet  Take 1 tablet (100 mg total) by mouth daily.     zolpidem 5 MG tablet  Commonly known as:  AMBIEN  Take 1 tablet (5 mg total) by mouth at bedtime as needed for sleep.           Objective:   Physical Exam BP 126/78 mmHg  Pulse 68  Temp(Src) 98.1 F (36.7 C) (Oral)  Ht  (1.676 m)  Wt 162 lb 8 oz (73.71 kg)  BMI 26.24 kg/m2  SpO2 98% General:   Well developed, well nourished . NAD.  HEENT:  Normocephalic . Face symmetric, atraumatic Lungs:  CTA B Normal respiratory effort, no intercostal retractions, no accessory muscle use. Heart: RRR,  no murmur.  No pretibial edema bilaterally  Skin: Not pale. Not jaundice Neurologic:  Alert, not oriented to time, space. Recognizes his wife, did not recognize me. Did not know who the president was.  Speech normal, gait appropriate for age and unassisted.  Did follow simple commands Psych-- No anxious or depressed appearing.      Assessment & Plan:   Assessment  > DM HTN Hyperlipidemia Hypothyroidism Stroke 08-2013  Dementia w/ behavioral disturbance (worse after stroke 2015) Resident @ [facility], memory unit  DJD BPH Glaucoma H/o diverticulitis  PLAN  DM: Continue with present care, CBGs are checked at the facility, had a single episode of low blood sugar. Check a A1c. HTN: BP very good today, check a BMP Hypothyroidism: due for a TSH Dementia with behavioral disturbances: The patient is more combative, recent UA was negative, ROS does not point to any triggers such as constipation or pain. Likely behavioral disturbances related to advancing of dementia. He is on Namenda, Xanax, occasionally takes an Ambien. Will do a trial with Aricept, recommend to watch for side effects. Disposition: Patient's wife would like to see the doctor of the facility, I think that is very good for them given the difficult time he has coming back here. I'll facilitate the transition any way we can. Primary care: Recommend to get a flu shot and Prevnar at the facility, instructions written

## 2015-06-04 ENCOUNTER — Ambulatory Visit: Payer: Medicare Other | Admitting: Neurology

## 2015-06-05 ENCOUNTER — Other Ambulatory Visit: Payer: Self-pay | Admitting: Internal Medicine

## 2015-06-09 ENCOUNTER — Other Ambulatory Visit: Payer: Self-pay | Admitting: Internal Medicine

## 2015-06-13 ENCOUNTER — Other Ambulatory Visit: Payer: Self-pay | Admitting: Internal Medicine

## 2015-06-24 ENCOUNTER — Other Ambulatory Visit: Payer: Self-pay | Admitting: Internal Medicine

## 2015-07-11 ENCOUNTER — Telehealth: Payer: Self-pay | Admitting: *Deleted

## 2015-07-11 NOTE — Telephone Encounter (Signed)
[facility] Medical records printed from visits with Dr. Drue NovelPaz from 12/05/12-06/06/13 and [facility] provided [facility]. Request sent for scanning. JG//CMA

## 2015-07-16 ENCOUNTER — Other Ambulatory Visit: Payer: Self-pay | Admitting: Internal Medicine

## 2015-09-08 ENCOUNTER — Other Ambulatory Visit: Payer: Self-pay | Admitting: Internal Medicine

## 2015-09-10 ENCOUNTER — Other Ambulatory Visit: Payer: Self-pay | Admitting: Internal Medicine

## 2015-09-10 NOTE — Telephone Encounter (Signed)
Per last note from PCP pt is to get refills from doctor at facility.

## 2015-09-10 NOTE — Telephone Encounter (Signed)
Please advise, last refill states that further refills need to come from memory unit?

## 2015-09-21 ENCOUNTER — Other Ambulatory Visit: Payer: Self-pay | Admitting: Internal Medicine

## 2015-09-27 ENCOUNTER — Other Ambulatory Visit: Payer: Self-pay | Admitting: Internal Medicine

## 2015-10-12 ENCOUNTER — Other Ambulatory Visit: Payer: Self-pay | Admitting: Internal Medicine

## 2015-10-22 ENCOUNTER — Emergency Department (HOSPITAL_COMMUNITY): Payer: Medicare Other

## 2015-10-22 ENCOUNTER — Encounter (HOSPITAL_COMMUNITY): Payer: Self-pay | Admitting: Family Medicine

## 2015-10-22 ENCOUNTER — Emergency Department (HOSPITAL_COMMUNITY)
Admission: EM | Admit: 2015-10-22 | Discharge: 2015-10-22 | Disposition: A | Discharge status: Home / Self Care | Payer: Medicare Other | Attending: Emergency Medicine | Admitting: Emergency Medicine

## 2015-10-22 DIAGNOSIS — Y9289 Other specified places as the place of occurrence of the external cause: Secondary | ICD-10-CM | POA: Insufficient documentation

## 2015-10-22 DIAGNOSIS — Y9389 Activity, other specified: Secondary | ICD-10-CM | POA: Insufficient documentation

## 2015-10-22 DIAGNOSIS — Z79899 Other long term (current) drug therapy: Secondary | ICD-10-CM | POA: Insufficient documentation

## 2015-10-22 DIAGNOSIS — Y998 Other external cause status: Secondary | ICD-10-CM | POA: Diagnosis not present

## 2015-10-22 DIAGNOSIS — Z7984 Long term (current) use of oral hypoglycemic drugs: Secondary | ICD-10-CM | POA: Insufficient documentation

## 2015-10-22 DIAGNOSIS — Z87891 Personal history of nicotine dependence: Secondary | ICD-10-CM | POA: Insufficient documentation

## 2015-10-22 DIAGNOSIS — E785 Hyperlipidemia, unspecified: Secondary | ICD-10-CM | POA: Insufficient documentation

## 2015-10-22 DIAGNOSIS — Z7902 Long term (current) use of antithrombotics/antiplatelets: Secondary | ICD-10-CM | POA: Diagnosis not present

## 2015-10-22 DIAGNOSIS — M1990 Unspecified osteoarthritis, unspecified site: Secondary | ICD-10-CM | POA: Diagnosis not present

## 2015-10-22 DIAGNOSIS — G309 Alzheimer's disease, unspecified: Secondary | ICD-10-CM | POA: Insufficient documentation

## 2015-10-22 DIAGNOSIS — W06XXXA Fall from bed, initial encounter: Secondary | ICD-10-CM | POA: Insufficient documentation

## 2015-10-22 DIAGNOSIS — S0083XA Contusion of other part of head, initial encounter: Secondary | ICD-10-CM

## 2015-10-22 DIAGNOSIS — F0280 Dementia in other diseases classified elsewhere without behavioral disturbance: Secondary | ICD-10-CM | POA: Insufficient documentation

## 2015-10-22 DIAGNOSIS — S0990XA Unspecified injury of head, initial encounter: Secondary | ICD-10-CM | POA: Diagnosis present

## 2015-10-22 DIAGNOSIS — E119 Type 2 diabetes mellitus without complications: Secondary | ICD-10-CM | POA: Diagnosis not present

## 2015-10-22 DIAGNOSIS — I10 Essential (primary) hypertension: Secondary | ICD-10-CM | POA: Insufficient documentation

## 2015-10-22 NOTE — ED Notes (Signed)
Called [facility] for pickup and [facility].

## 2015-10-22 NOTE — ED Notes (Signed)
[facility] with c/o fall from bed this morning.  Unwitnessed fall, no LOC per pt.  He is oriented to self, place, and situation but not to time.  Per EMS nursing home staff says pt is at baseline.  Pt is pleasantly confused and in NAD.  Dr. Fayrene FearingJames at bedside

## 2015-10-22 NOTE — ED Provider Notes (Signed)
CSN: [id number]     Arrival date & time 10/22/15  0744 History   First MD Initiated Contact with Patient 10/22/15 [phone]     Chief Complaint  Patient presents with  . Fall      HPI  Patient presents via EMS from his assisted living facility. He resides at the facility secondary to dementia. "Rolled" out of bed and struck his forehead this morning. Called for help. Was able to stand. According to staff seemed at his baseline but did complain of a headache. He is anticoagulated with Plavix. Transferred here without incident.  Recent illness per report from the facility, via EMS.  Past Medical History  Diagnosis Date  . BPH (benign prostatic hypertrophy)     nocturia  . Diabetes mellitus   . Hypertension   . Hyperlipidemia   . Diverticulitis   . ED (erectile dysfunction)   . Glaucoma     sees eye doctor routinely  . Allergic rhinitis   . Subclinical hypothyroidism   . Arthritis   . Blood dyscrasia   . Alzheimer's dementia    Past Surgical History  Procedure Laterality Date  . Inguinal hernia repair    . Colectomy      w/ reversal due to divertiuclitis per patient in 1990s aprox  . Sbo repair    . Total knee arthroplasty Right 10/27/2012    Procedure: TOTAL KNEE ARTHROPLASTY;  Surgeon: Loreta Ave, MD;  Location: [facility] OR;  Service: Orthopedics;  Laterality: Right;   Family History  Problem Relation Age of Onset  . Lupus Sister   . Stroke Brother 41    CVA, no FH premature CAD  . Diabetes Other     granddaughter  . Colon cancer Neg Hx   . Prostate cancer Other     cousin, age 61  . CAD Mother     "enlarged heart"   Social History  Substance Use Topics  . Smoking status: Former Smoker    Types: Cigarettes  . Smokeless tobacco: Never Used     Comment: used to smoke rarely  . Alcohol Use: 0.0 oz/week    0 Standard drinks or equivalent per week     Comment: rarely    Review of Systems  Unable to perform ROS: Dementia      Allergies  Beef-derived products;  Pork-derived products; Vasotec; and Other  Home Medications   Prior to Admission medications   Medication Sig Start Date End Date Taking? Authorizing Provider  acetaminophen (TYLENOL) 500 MG tablet Take 1,000 mg by mouth every 6 (six) hours as needed (pain).   Yes Historical Provider, MD  ALPRAZolam (XANAX) 0.25 MG tablet Take 1 tablet (0.25 mg total) by mouth daily as needed for anxiety. 05/14/15  Yes Wanda Plump, MD  atorvastatin (LIPITOR) 40 MG tablet Take 1 tablet (40 mg total) by mouth daily at 6 PM. 06/13/15  Yes Wanda Plump, MD  bimatoprost (LUMIGAN) 0.01 % SOLN Place 1 drop into both eyes at bedtime.   Yes Historical Provider, MD  carvedilol (COREG) 25 MG tablet Take 25 mg by mouth 2 (two) times daily with a meal.   Yes Historical Provider, MD  clopidogrel (PLAVIX) 75 MG tablet Take 1 tablet (75 mg total) by mouth daily with breakfast. 10/03/14  Yes Wanda Plump, MD  dextromethorphan-guaiFENesin [facility] DM) 30-600 MG per 12 hr tablet Take 1 tablet by mouth 2 (two) times daily as needed for cough. 12/26/14  Yes Wanda Plump, MD  EPINEPHrine (  EPIPEN 2-PAK) 0.3 mg/0.3 mL IJ SOAJ injection Inject 0.3 mLs (0.3 mg total) into the muscle once. 01/15/15  Yes Wanda Plump, MD  feeding supplement, ENSURE ENLIVE, (ENSURE ENLIVE) LIQD Take 237 mLs by mouth daily.   Yes Historical Provider, MD  levothyroxine (SYNTHROID, LEVOTHROID) 25 MCG tablet Take 1 tablet (25 mcg total) by mouth daily before breakfast. 06/12/15  Yes Wanda Plump, MD  Loperamide HCl (IMODIUM A-D) 1 MG/7.5ML LIQD Take 30 mLs by mouth 3 (three) times daily as needed.   Yes Historical Provider, MD  metFORMIN (GLUCOPHAGE) 500 MG tablet Take 1 tablet (500 mg total) by mouth 2 (two) times daily with a meal. 07/16/15  Yes Wanda Plump, MD  Multiple Vitamin (MULTIVITAMIN WITH MINERALS) TABS tablet Take 1 tablet by mouth daily. 07/27/13  Yes Hager Art, DO  NAMENDA 10 MG tablet TAKE 1 TABLET TWICE A DAY 11/24/14  Yes Drema Dallas, DO  saxagliptin HCl  (ONGLYZA) 5 MG TABS tablet Take 1 tablet (5 mg total) by mouth daily. 04/18/15  Yes Wanda Plump, MD  senna-docusate (SENOKOT-S) 8.6-50 MG per tablet Take 1 tablet by mouth at bedtime as needed for moderate constipation. 07/27/13  Yes Tyrian Art, DO  sulfamethoxazole-trimethoprim (BACTRIM DS,SEPTRA DS) 800-160 MG tablet Take 1 tablet by mouth 2 (two) times daily. 10/21/15 10/25/15 Yes Historical Provider, MD  tamsulosin (FLOMAX) 0.4 MG CAPS capsule Take 1 capsule (0.4 mg total) by mouth daily. 11/30/14  Yes Wanda Plump, MD  carvedilol (COREG) 12.5 MG tablet Take 1 tablet (12.5 mg total) by mouth 2 (two) times daily. Patient not taking: Reported on 10/22/2015 05/21/15   Wanda Plump, MD  fluticasone [facility]) 50 MCG/ACT nasal spray Place 2 sprays into both nostrils daily as needed for allergies or rhinitis. Patient taking differently: Place 2 sprays into both nostrils daily.  01/15/15   Wanda Plump, MD  folic acid (FOLVITE) 1 MG tablet Take 1 tablet (1 mg total) by mouth daily. Patient not taking: Reported on 10/22/2015 03/27/15   Wanda Plump, MD  folic acid (FOLVITE) 1 MG tablet Take 1 tablet (1 mg total) by mouth daily. Patient not taking: Reported on 10/22/2015 06/25/15   Wanda Plump, MD  zolpidem (AMBIEN) 5 MG tablet Take 1 tablet (5 mg total) by mouth at bedtime as needed for sleep. Patient not taking: Reported on 05/21/2015 01/15/15   Wanda Plump, MD   BP 165/67 mmHg  Pulse 49  Temp(Src) 97.7 F (36.5 C) (Oral)  Resp 22  SpO2 99% Physical Exam  Constitutional: He appears well-developed and well-nourished. No distress.  HENT:  Head: Normocephalic.    Contusion left anterior for him. Otherwise normal HEENT exam. No blood over the TMs, mastoids, or from ears nose or mouth. Reports minimal tenderness on the occiput. No midline cervical spine tenderness.  Eyes: Conjunctivae are normal. Pupils are equal, round, and reactive to light. No scleral icterus.  Neck: Normal range of motion. Neck supple. No  thyromegaly present.  Cardiovascular: Normal rate and regular rhythm.  Exam reveals no gallop and no friction rub.   No murmur heard. Pulmonary/Chest: Effort normal and breath sounds normal. No respiratory distress. He has no wheezes. He has no rales.  Abdominal: Soft. Bowel sounds are normal. He exhibits no distension. There is no tenderness. There is no rebound.  Musculoskeletal: Normal range of motion.  Neurological: He is alert.  Pleasant. Not able to offer details of his history. Nonfocal.  Skin: Skin is warm and dry. No rash noted.  Psychiatric: He has a normal mood and affect. His behavior is normal.    ED Course  Procedures (including critical care time) Labs Review Labs Reviewed - No data to display  Imaging Review Ct Head Wo Contrast  10/22/2015  CLINICAL DATA:  Status post fall, forehead contusion.  On Plavix. EXAM: CT HEAD WITHOUT CONTRAST CT CERVICAL SPINE WITHOUT CONTRAST TECHNIQUE: Multidetector CT imaging of the head and cervical spine was performed following the standard protocol without intravenous contrast. Multiplanar CT image reconstructions of the cervical spine were also generated. COMPARISON:  03/21/2015, 03/14/2014 FINDINGS: CT HEAD FINDINGS There is no evidence of mass effect, midline shift, or extra-axial fluid collections. There is no evidence of a space-occupying lesion or intracranial hemorrhage. There is no evidence of a cortical-based area of acute infarction. There is an old left thalamic lacunar infarct. There is generalized cerebral atrophy. There is periventricular white matter low attenuation likely secondary to microangiopathy. The ventricles and sulci are appropriate for the patient's age. The basal cisterns are patent. Visualized portions of the orbits are unremarkable. The visualized portions of the paranasal sinuses and mastoid air cells are unremarkable. Cerebrovascular atherosclerotic calcifications are noted. The osseous structures are unremarkable. CT  CERVICAL SPINE FINDINGS The alignment is anatomic. The vertebral body heights are maintained. There is no acute fracture. There is no static listhesis. The prevertebral soft tissues are normal. The intraspinal soft tissues are not fully imaged on this examination due to poor soft tissue contrast, but there is no gross soft tissue abnormality. There is mild degenerative disc disease at C3-4, C5-6 and C6-7. Bilateral facet arthropathy at C2-3 with left foraminal stenosis. There is a broad-based disc osteophyte complex at C3-4 with bilateral uncovertebral degenerative changes. There is a broad disc osteophyte complex at C5-6 with bilateral uncovertebral degenerative changes and foraminal narrowing. There is a broad-based disc osteophyte complex at C6-7 with bilateral uncovertebral degenerative changes and bilateral foraminal stenosis. There is bilateral facet arthropathy at C7-T1. The visualized portions of the lung apices demonstrate no focal abnormality. IMPRESSION: 1. No acute intracranial pathology. 2. No acute osseous injury of the cervical spine. 3. Cervical spine spondylosis as described above. Electronically Signed   By: Elige Ko   On: 10/22/2015 09:04   Ct Cervical Spine Wo Contrast  10/22/2015  CLINICAL DATA:  Status post fall, forehead contusion.  On Plavix. EXAM: CT HEAD WITHOUT CONTRAST CT CERVICAL SPINE WITHOUT CONTRAST TECHNIQUE: Multidetector CT imaging of the head and cervical spine was performed following the standard protocol without intravenous contrast. Multiplanar CT image reconstructions of the cervical spine were also generated. COMPARISON:  03/21/2015, 03/14/2014 FINDINGS: CT HEAD FINDINGS There is no evidence of mass effect, midline shift, or extra-axial fluid collections. There is no evidence of a space-occupying lesion or intracranial hemorrhage. There is no evidence of a cortical-based area of acute infarction. There is an old left thalamic lacunar infarct. There is generalized  cerebral atrophy. There is periventricular white matter low attenuation likely secondary to microangiopathy. The ventricles and sulci are appropriate for the patient's age. The basal cisterns are patent. Visualized portions of the orbits are unremarkable. The visualized portions of the paranasal sinuses and mastoid air cells are unremarkable. Cerebrovascular atherosclerotic calcifications are noted. The osseous structures are unremarkable. CT CERVICAL SPINE FINDINGS The alignment is anatomic. The vertebral body heights are maintained. There is no acute fracture. There is no static listhesis. The prevertebral soft tissues are normal. The intraspinal soft  tissues are not fully imaged on this examination due to poor soft tissue contrast, but there is no gross soft tissue abnormality. There is mild degenerative disc disease at C3-4, C5-6 and C6-7. Bilateral facet arthropathy at C2-3 with left foraminal stenosis. There is a broad-based disc osteophyte complex at C3-4 with bilateral uncovertebral degenerative changes. There is a broad disc osteophyte complex at C5-6 with bilateral uncovertebral degenerative changes and foraminal narrowing. There is a broad-based disc osteophyte complex at C6-7 with bilateral uncovertebral degenerative changes and bilateral foraminal stenosis. There is bilateral facet arthropathy at C7-T1. The visualized portions of the lung apices demonstrate no focal abnormality. IMPRESSION: 1. No acute intracranial pathology. 2. No acute osseous injury of the cervical spine. 3. Cervical spine spondylosis as described above. Electronically Signed   By: Elige KoHetal  Patel   On: 10/22/2015 09:04   I have personally reviewed and evaluated these images and lab results as part of my medical decision-making.   EKG Interpretation None      MDM   Final diagnoses:  Forehead contusion, initial encounter    Normal imaging. Plan is home. Symptomatic treatment.    Rolland PorterMark Velmer Woelfel, MD 10/22/15 [phone]

## 2015-10-22 NOTE — Discharge Instructions (Signed)
Contusion °A contusion is a deep bruise. Contusions are the result of a blunt injury to tissues and muscle fibers under the skin. The injury causes bleeding under the skin. The skin overlying the contusion may turn blue, purple, or yellow. Minor injuries will give you a painless contusion, but more severe contusions may stay painful and swollen for a few weeks.  °CAUSES  °This condition is usually caused by a blow, trauma, or direct force to an area of the body. °SYMPTOMS  °Symptoms of this condition include: °· Swelling of the injured area. °· Pain and tenderness in the injured area. °· Discoloration. The area may have redness and then turn blue, purple, or yellow. °DIAGNOSIS  °This condition is diagnosed based on a physical exam and medical history. An X-ray, CT scan, or MRI may be needed to determine if there are any associated injuries, such as broken bones (fractures). °TREATMENT  °Specific treatment for this condition depends on what area of the body was injured. In general, the best treatment for a contusion is resting, icing, applying pressure to (compression), and elevating the injured area. This is often called the RICE strategy. Over-the-counter anti-inflammatory medicines may also be recommended for pain control.  °HOME CARE INSTRUCTIONS  °· Rest the injured area. °· If directed, apply ice to the injured area: °· Put ice in a plastic bag. °· Place a towel between your skin and the bag. °· Leave the ice on for 20 minutes, 2-3 times per day. °· If directed, apply light compression to the injured area using an elastic bandage. Make sure the bandage is not wrapped too tightly. Remove and reapply the bandage as directed by your health care provider. °· If possible, raise (elevate) the injured area above the level of your heart while you are sitting or lying down. °· Take over-the-counter and prescription medicines only as told by your health care provider. °SEEK MEDICAL CARE IF: °· Your symptoms do not  improve after several days of treatment. °· Your symptoms get worse. °· You have difficulty moving the injured area. °SEEK IMMEDIATE MEDICAL CARE IF:  °· You have severe pain. °· You have numbness in a hand or foot. °· Your hand or foot turns pale or cold. °  °This information is not intended to replace advice given to you by your health care provider. Make sure you discuss any questions you have with your health care provider. °  °Document Released: 04/02/2005 Document Revised: 03/14/2015 Document Reviewed: 11/08/2014 °Elsevier Interactive Patient Education ©2016 [employer] ° °Facial or Scalp Contusion ° A facial or scalp contusion is a deep bruise on the face or head. Contusions happen when an injury causes bleeding under the skin. Signs of bruising include pain, puffiness (swelling), and discolored skin. The contusion may turn blue, purple, or yellow. °HOME CARE °· Only take medicines as told by your doctor. °· Put ice on the injured area. °¨ Put ice in a plastic bag. °¨ Place a towel between your skin and the bag. °¨ Leave the ice on for 20 minutes, 2-3 times a day. °GET HELP IF: °· You have bite problems. °· You have pain when chewing. °· You are worried about your face not healing normally. °GET HELP RIGHT AWAY IF:  °· You have severe pain or a headache and medicine does not help. °· You are very tired or confused, or your personality changes. °· You throw up (vomit). °· You have a nosebleed that will not stop. °· You see two of   everything (double vision) or have blurry vision. °· You have fluid coming from your nose or ear. °· You have problems walking or using your arms or legs. °MAKE SURE YOU:  °· Understand these instructions. °· Will watch your condition. °· Will get help right away if you are not doing well or get worse. °  °This information is not intended to replace advice given to you by your health care provider. Make sure you discuss any questions you have with your health care provider. °    °Document Released: 06/12/2011 Document Revised: 07/14/2014 Document Reviewed: 02/03/2013 °Elsevier Interactive Patient Education ©2016 [employer] ° °

## 2015-11-10 ENCOUNTER — Emergency Department (HOSPITAL_COMMUNITY): Payer: Medicare Other

## 2015-11-10 ENCOUNTER — Encounter (HOSPITAL_COMMUNITY): Payer: Self-pay | Admitting: Emergency Medicine

## 2015-11-10 ENCOUNTER — Emergency Department (HOSPITAL_COMMUNITY)
Admission: EM | Admit: 2015-11-10 | Discharge: 2015-11-10 | Disposition: A | Discharge status: Home / Self Care | Payer: Medicare Other | Attending: Emergency Medicine | Admitting: Emergency Medicine

## 2015-11-10 DIAGNOSIS — E785 Hyperlipidemia, unspecified: Secondary | ICD-10-CM | POA: Insufficient documentation

## 2015-11-10 DIAGNOSIS — Z79899 Other long term (current) drug therapy: Secondary | ICD-10-CM | POA: Diagnosis not present

## 2015-11-10 DIAGNOSIS — Z7951 Long term (current) use of inhaled steroids: Secondary | ICD-10-CM | POA: Diagnosis not present

## 2015-11-10 DIAGNOSIS — Z87891 Personal history of nicotine dependence: Secondary | ICD-10-CM | POA: Insufficient documentation

## 2015-11-10 DIAGNOSIS — Z96651 Presence of right artificial knee joint: Secondary | ICD-10-CM | POA: Insufficient documentation

## 2015-11-10 DIAGNOSIS — Z7984 Long term (current) use of oral hypoglycemic drugs: Secondary | ICD-10-CM | POA: Diagnosis not present

## 2015-11-10 DIAGNOSIS — S0990XA Unspecified injury of head, initial encounter: Secondary | ICD-10-CM | POA: Diagnosis not present

## 2015-11-10 DIAGNOSIS — E02 Subclinical iodine-deficiency hypothyroidism: Secondary | ICD-10-CM | POA: Diagnosis not present

## 2015-11-10 DIAGNOSIS — E119 Type 2 diabetes mellitus without complications: Secondary | ICD-10-CM | POA: Insufficient documentation

## 2015-11-10 DIAGNOSIS — N401 Enlarged prostate with lower urinary tract symptoms: Secondary | ICD-10-CM | POA: Insufficient documentation

## 2015-11-10 DIAGNOSIS — I10 Essential (primary) hypertension: Secondary | ICD-10-CM | POA: Diagnosis not present

## 2015-11-10 DIAGNOSIS — R351 Nocturia: Secondary | ICD-10-CM | POA: Insufficient documentation

## 2015-11-10 DIAGNOSIS — W06XXXA Fall from bed, initial encounter: Secondary | ICD-10-CM | POA: Insufficient documentation

## 2015-11-10 DIAGNOSIS — W19XXXA Unspecified fall, initial encounter: Secondary | ICD-10-CM

## 2015-11-10 DIAGNOSIS — M1990 Unspecified osteoarthritis, unspecified site: Secondary | ICD-10-CM | POA: Diagnosis not present

## 2015-11-10 DIAGNOSIS — Y999 Unspecified external cause status: Secondary | ICD-10-CM | POA: Insufficient documentation

## 2015-11-10 DIAGNOSIS — Y929 Unspecified place or not applicable: Secondary | ICD-10-CM | POA: Diagnosis not present

## 2015-11-10 DIAGNOSIS — H409 Unspecified glaucoma: Secondary | ICD-10-CM | POA: Insufficient documentation

## 2015-11-10 DIAGNOSIS — F0280 Dementia in other diseases classified elsewhere without behavioral disturbance: Secondary | ICD-10-CM | POA: Diagnosis not present

## 2015-11-10 DIAGNOSIS — Y939 Activity, unspecified: Secondary | ICD-10-CM | POA: Insufficient documentation

## 2015-11-10 DIAGNOSIS — Z7902 Long term (current) use of antithrombotics/antiplatelets: Secondary | ICD-10-CM | POA: Diagnosis not present

## 2015-11-10 DIAGNOSIS — G309 Alzheimer's disease, unspecified: Secondary | ICD-10-CM | POA: Insufficient documentation

## 2015-11-10 DIAGNOSIS — K148 Other diseases of tongue: Secondary | ICD-10-CM | POA: Diagnosis present

## 2015-11-10 NOTE — ED Notes (Signed)
Bed: [id number] Expected date:  Expected time:  Means of arrival:  Comments: EMS- fall; dementia

## 2015-11-10 NOTE — ED Notes (Signed)
[facility] was called for pt's [facility] (family unable to transport pt).

## 2015-11-10 NOTE — ED Notes (Signed)
Pt fell fixing something in his bed, from an assisted living. Pt with no LOC, pt bit tongue and c/o tongue pain.

## 2015-11-10 NOTE — ED Notes (Signed)
Pt ambulated well to the bathroom and in the hall without assistance--- gait and balance steady; no complaint of pain during ambulation.

## 2015-11-10 NOTE — ED Notes (Signed)
[facility] here to transport pt back to [facility].

## 2015-11-10 NOTE — ED Provider Notes (Signed)
CSN: [id number]     Arrival date & time 11/10/15  1759 History   First MD Initiated Contact with Patient 11/10/15 1835     Chief Complaint  Patient presents with  . Fall     (Consider location/radiation/quality/duration/timing/severity/associated sxs/prior Treatment) HPI   TECUMSEH YEAGLEY is an 82 y.o M with a pmhx of dementia, HTn, HLD, DM who presents to the ED to be evaluated after a fall. Pt states that he fell while getting out of bed today. Pt states he slipped and fell striking the front of his head on the night stand. Pt also bit his tongue during the fall and had some bleeding. He denies LOC. Pt was able to ambulate without difficulty after the fall. He denies back pain, neck pain, blurry vision. Pt is on Plavix.    Past Medical History  Diagnosis Date  . BPH (benign prostatic hypertrophy)     nocturia  . Diabetes mellitus   . Hypertension   . Hyperlipidemia   . Diverticulitis   . ED (erectile dysfunction)   . Glaucoma     sees eye doctor routinely  . Allergic rhinitis   . Subclinical hypothyroidism   . Arthritis   . Blood dyscrasia   . Alzheimer's dementia    Past Surgical History  Procedure Laterality Date  . Inguinal hernia repair    . Colectomy      w/ reversal due to divertiuclitis per patient in 1990s aprox  . Sbo repair    . Total knee arthroplasty Right 10/27/2012    Procedure: TOTAL KNEE ARTHROPLASTY;  Surgeon: Loreta Ave, MD;  Location: [facility];  Service: Orthopedics;  Laterality: Right;   Family History  Problem Relation Age of Onset  . Lupus Sister   . Stroke Brother 44    CVA, no FH premature CAD  . Diabetes Other     granddaughter  . Colon cancer Neg Hx   . Prostate cancer Other     cousin, age 66  . CAD Mother     "enlarged heart"   Social History  Substance Use Topics  . Smoking status: Former Smoker    Types: Cigarettes  . Smokeless tobacco: Never Used     Comment: used to smoke rarely  . Alcohol Use: 0.0 oz/week    0 Standard  drinks or equivalent per week     Comment: rarely    Review of Systems  All other systems reviewed and are negative.     Allergies  Beef-derived products; Pork-derived products; Vasotec; and Other  Home Medications   Prior to Admission medications   Medication Sig Start Date End Date Taking? Authorizing Provider  acetaminophen (TYLENOL) 500 MG tablet Take 1,000 mg by mouth every 6 (six) hours as needed (pain).   Yes Historical Provider, MD  ALPRAZolam (XANAX) 0.25 MG tablet Take 1 tablet (0.25 mg total) by mouth daily as needed for anxiety. Patient taking differently: Take 0.25 mg by mouth 2 (two) times daily.  05/14/15  Yes Wanda Plump, MD  atorvastatin (LIPITOR) 40 MG tablet Take 1 tablet (40 mg total) by mouth daily at 6 PM. 06/13/15  Yes Wanda Plump, MD  bimatoprost (LUMIGAN) 0.01 % SOLN Place 1 drop into both eyes at bedtime.   Yes Historical Provider, MD  carvedilol (COREG) 25 MG tablet Take 25 mg by mouth 2 (two) times daily with a meal.   Yes Historical Provider, MD  clopidogrel (PLAVIX) 75 MG tablet Take 1 tablet (75 mg  total) by mouth daily with breakfast. 10/03/14  Yes Wanda PlumpJose E Paz, MD  dextromethorphan-guaiFENesin [facility](MUCINEX DM) 30-600 MG per 12 hr tablet Take 1 tablet by mouth 2 (two) times daily as needed for cough. 12/26/14  Yes Wanda PlumpJose E Paz, MD  EPINEPHrine (EPIPEN 2-PAK) 0.3 mg/0.3 mL IJ SOAJ injection Inject 0.3 mLs (0.3 mg total) into the muscle once. 01/15/15  Yes Wanda PlumpJose E Paz, MD  feeding supplement, ENSURE ENLIVE, (ENSURE ENLIVE) LIQD Take 237 mLs by mouth daily.   Yes Historical Provider, MD  fluticasone (FLONASE) 50 MCG/ACT nasal spray Place 2 sprays into both nostrils daily as needed for allergies or rhinitis. Patient taking differently: Place 2 sprays into both nostrils daily.  01/15/15  Yes Wanda PlumpJose E Paz, MD  hydrochlorothiazide (HYDRODIURIL) 25 MG tablet Take 25 mg by mouth daily.   Yes Historical Provider, MD  levothyroxine (SYNTHROID, LEVOTHROID) 25 MCG tablet Take 1 tablet  (25 mcg total) by mouth daily before breakfast. 06/12/15  Yes Wanda PlumpJose E Paz, MD  Loperamide HCl (IMODIUM A-D) 1 MG/7.5ML LIQD Take 30 mLs by mouth 3 (three) times daily as needed (diarrhea).    Yes Historical Provider, MD  metFORMIN (GLUCOPHAGE) 500 MG tablet Take 1 tablet (500 mg total) by mouth 2 (two) times daily with a meal. 07/16/15  Yes Wanda PlumpJose E Paz, MD  Multiple Vitamin (MULTIVITAMIN WITH MINERALS) TABS tablet Take 1 tablet by mouth daily. 07/27/13  Yes Linda ArtJessica U Vann, DO  NAMENDA 10 MG tablet TAKE 1 TABLET TWICE A DAY 11/24/14  Yes Drema DallasAdam R Jaffe, DO  saxagliptin HCl (ONGLYZA) 5 MG TABS tablet Take 1 tablet (5 mg total) by mouth daily. 04/18/15  Yes Wanda PlumpJose E Paz, MD  senna-docusate (SENOKOT-S) 8.6-50 MG per tablet Take 1 tablet by mouth at bedtime as needed for moderate constipation. 07/27/13  Yes Marlene ArtJessica U Vann, DO  tamsulosin (FLOMAX) 0.4 MG CAPS capsule Take 1 capsule (0.4 mg total) by mouth daily. 11/30/14  Yes Wanda PlumpJose E Paz, MD  carvedilol (COREG) 12.5 MG tablet Take 1 tablet (12.5 mg total) by mouth 2 (two) times daily. Patient not taking: Reported on 10/22/2015 05/21/15   Wanda PlumpJose E Paz, MD  folic acid (FOLVITE) 1 MG tablet Take 1 tablet (1 mg total) by mouth daily. Patient not taking: Reported on 10/22/2015 03/27/15   Wanda PlumpJose E Paz, MD  folic acid (FOLVITE) 1 MG tablet Take 1 tablet (1 mg total) by mouth daily. Patient not taking: Reported on 10/22/2015 06/25/15   Wanda PlumpJose E Paz, MD  zolpidem (AMBIEN) 5 MG tablet Take 1 tablet (5 mg total) by mouth at bedtime as needed for sleep. Patient not taking: Reported on 05/21/2015 01/15/15   Wanda PlumpJose E Paz, MD   BP 109/86 mmHg  Pulse 72  Temp(Src) 98.4 F (36.9 C) (Oral)  Resp 20  SpO2 96% Physical Exam  Constitutional: He appears well-developed and well-nourished. No distress.  HENT:  Head: Normocephalic and atraumatic.  Mouth/Throat: Oropharynx is clear and moist. No oropharyngeal exudate.  No laceration noted to tongue. No battle sign. No raccoon eyes.  Eyes:  Conjunctivae and EOM are normal. Pupils are equal, round, and reactive to light. Right eye exhibits no discharge. Left eye exhibits no discharge. No scleral icterus.  Neck: Neck supple.  Cardiovascular: Normal rate, regular rhythm, normal heart sounds and intact distal pulses.  Exam reveals no gallop and no friction rub.   No murmur heard. Pulmonary/Chest: Effort normal and breath sounds normal. No respiratory distress. He has no wheezes. He has no rales.  He exhibits no tenderness.  Abdominal: Soft. He exhibits no distension. There is no tenderness. There is no guarding.  Musculoskeletal: Normal range of motion. He exhibits no edema.  No midline spinal tenderness. Full range of motion of C, T, L-spine. No step-offs or obvious bony deformities.  Neurological: He is alert. No cranial nerve deficit.  Skin: Skin is warm and dry. No rash noted. He is not diaphoretic. No erythema. No pallor.  Psychiatric: He has a normal mood and affect. His behavior is normal.  Nursing note and vitals reviewed.   ED Course  Procedures (including critical care time) Labs Review Labs Reviewed - No data to display  Imaging Review Ct Head Wo Contrast  11/10/2015  CLINICAL DATA:  Larey Seat from bed. EXAM: CT HEAD WITHOUT CONTRAST CT CERVICAL SPINE WITHOUT CONTRAST TECHNIQUE: Multidetector CT imaging of the head and cervical spine was performed following the standard protocol without intravenous contrast. Multiplanar CT image reconstructions of the cervical spine were also generated. COMPARISON:  10/22/2015 FINDINGS: CT HEAD FINDINGS There is no intracranial hemorrhage, mass or evidence of acute infarction. There is moderate generalized atrophy. There is moderate chronic microvascular ischemic change. There is no significant extra-axial fluid collection. No acute intracranial findings are evident. The calvarium and skullbase are intact. The visible paranasal sinuses and orbits are unremarkable. CT CERVICAL SPINE FINDINGS The  vertebral column, pedicles and facet articulations are intact. There is no evidence of acute fracture. No acute soft tissue abnormalities are evident. Moderately severe degenerative cervical disc disease is present, with degenerative central canal stenosis at C6-7. IMPRESSION: 1. Negative for acute intracranial traumatic injury. There is moderate generalized atrophy and chronic white matter hypodensity, likely small vessel disease. 2. Negative for acute cervical spine fracture. Electronically Signed   By: Ellery Plunk M.D.   On: 11/10/2015 19:21   Ct Cervical Spine Wo Contrast  11/10/2015  CLINICAL DATA:  Larey Seat from bed. EXAM: CT HEAD WITHOUT CONTRAST CT CERVICAL SPINE WITHOUT CONTRAST TECHNIQUE: Multidetector CT imaging of the head and cervical spine was performed following the standard protocol without intravenous contrast. Multiplanar CT image reconstructions of the cervical spine were also generated. COMPARISON:  10/22/2015 FINDINGS: CT HEAD FINDINGS There is no intracranial hemorrhage, mass or evidence of acute infarction. There is moderate generalized atrophy. There is moderate chronic microvascular ischemic change. There is no significant extra-axial fluid collection. No acute intracranial findings are evident. The calvarium and skullbase are intact. The visible paranasal sinuses and orbits are unremarkable. CT CERVICAL SPINE FINDINGS The vertebral column, pedicles and facet articulations are intact. There is no evidence of acute fracture. No acute soft tissue abnormalities are evident. Moderately severe degenerative cervical disc disease is present, with degenerative central canal stenosis at C6-7. IMPRESSION: 1. Negative for acute intracranial traumatic injury. There is moderate generalized atrophy and chronic white matter hypodensity, likely small vessel disease. 2. Negative for acute cervical spine fracture. Electronically Signed   By: Ellery Plunk M.D.   On: 11/10/2015 19:21   I have  personally reviewed and evaluated these images and lab results as part of my medical decision-making.   EKG Interpretation None      MDM   Final diagnoses:  Fall  Head injury, initial encounter    82 year old male with history of dementia presents to the ED to be evaluated after a mechanical fall. Patient appears well in ED, in no apparent distress. He denies any pain and is able to move all 4 extremities. CT head and C-spine were negative for  any M normality. Patient is ambulatory in the ED without difficulty and is at his baseline per family members in the room. The patient is safe for discharge with PCP follow-up. Return precautions outlined in patient discharge instructions.   Patient was discussed with and seen by Dr. Freida Busman who agrees with the treatment plan.     Lester Kinsman Sattley, PA-C 11/10/15 2108  Lorre Nick, MD 11/18/15 [phone]

## 2015-11-10 NOTE — ED Notes (Signed)
Patient transported to CT 

## 2015-11-10 NOTE — Discharge Instructions (Signed)
Head Injury, Adult You have a head injury. Headaches and throwing up (vomiting) are common after a head injury. It should be easy to wake up from sleeping. Sometimes you must stay in the hospital. Most problems happen within the first 24 hours. Side effects may occur up to 7-10 days after the injury.  WHAT ARE THE TYPES OF HEAD INJURIES? Head injuries can be as minor as a bump. Some head injuries can be more severe. More severe head injuries include:  A jarring injury to the brain (concussion).  A bruise of the brain (contusion). This mean there is bleeding in the brain that can cause swelling.  A cracked skull (skull fracture).  Bleeding in the brain that collects, clots, and forms a bump (hematoma). WHEN SHOULD I GET HELP RIGHT AWAY?   You are confused or sleepy.  You cannot be woken up.  You feel sick to your stomach (nauseous) or keep throwing up (vomiting).  Your dizziness or unsteadiness is getting worse.  You have very bad, lasting headaches that are not helped by medicine. Take medicines only as told by your doctor.  You cannot use your arms or legs like normal.  You cannot walk.  You notice changes in the black spots in the center of the colored part of your eye (pupil).  You have clear or bloody fluid coming from your nose or ears.  You have trouble seeing. During the next 24 hours after the injury, you must stay with someone who can watch you. This person should get help right away (call 911 in the [place] if you start to shake and are not able to control it (have seizures), you pass out, or you are unable to wake up. HOW CAN I PREVENT A HEAD INJURY IN THE FUTURE?  Wear seat belts.  Wear a helmet while bike riding and playing sports like football.  Stay away from dangerous activities around the house. WHEN CAN I RETURN TO NORMAL ACTIVITIES AND ATHLETICS? See your doctor before doing these activities. You should not do normal activities or play contact sports until 1  week after the following symptoms have stopped:  Headache that does not go away.  Dizziness.  Poor attention.  Confusion.  Memory problems.  Sickness to your stomach or throwing up.  Tiredness.  Fussiness.  Bothered by bright lights or loud noises.  Anxiousness or depression.  Restless sleep. MAKE SURE YOU:   Understand these instructions.  Will watch your condition.  Will get help right away if you are not doing well or get worse.   This information is not intended to replace advice given to you by your health care provider. Make sure you discuss any questions you have with your health care provider.  Follow up with your primary care provider as needed. Please ambulate with assistance to prevent future falls. Return to the ED if you experience severe worsening of your symptoms, loss of consciousness, blurry vision, vomiting.

## 2015-11-23 ENCOUNTER — Other Ambulatory Visit: Payer: Self-pay | Admitting: Internal Medicine

## 2016-02-22 ENCOUNTER — Encounter (HOSPITAL_COMMUNITY): Payer: Self-pay

## 2016-02-22 ENCOUNTER — Emergency Department (HOSPITAL_COMMUNITY): Payer: Medicare Other

## 2016-02-22 ENCOUNTER — Emergency Department (HOSPITAL_COMMUNITY)
Admission: EM | Admit: 2016-02-22 | Discharge: 2016-02-22 | Disposition: A | Discharge status: Home / Self Care | Payer: Medicare Other | Attending: Emergency Medicine | Admitting: Emergency Medicine

## 2016-02-22 DIAGNOSIS — W06XXXA Fall from bed, initial encounter: Secondary | ICD-10-CM | POA: Insufficient documentation

## 2016-02-22 DIAGNOSIS — Z7984 Long term (current) use of oral hypoglycemic drugs: Secondary | ICD-10-CM | POA: Insufficient documentation

## 2016-02-22 DIAGNOSIS — Z96651 Presence of right artificial knee joint: Secondary | ICD-10-CM | POA: Diagnosis not present

## 2016-02-22 DIAGNOSIS — S0101XA Laceration without foreign body of scalp, initial encounter: Secondary | ICD-10-CM | POA: Diagnosis not present

## 2016-02-22 DIAGNOSIS — Y999 Unspecified external cause status: Secondary | ICD-10-CM | POA: Diagnosis not present

## 2016-02-22 DIAGNOSIS — Z7902 Long term (current) use of antithrombotics/antiplatelets: Secondary | ICD-10-CM | POA: Diagnosis not present

## 2016-02-22 DIAGNOSIS — Y939 Activity, unspecified: Secondary | ICD-10-CM | POA: Diagnosis not present

## 2016-02-22 DIAGNOSIS — Z8673 Personal history of transient ischemic attack (TIA), and cerebral infarction without residual deficits: Secondary | ICD-10-CM | POA: Diagnosis not present

## 2016-02-22 DIAGNOSIS — F0281 Dementia in other diseases classified elsewhere with behavioral disturbance: Secondary | ICD-10-CM | POA: Insufficient documentation

## 2016-02-22 DIAGNOSIS — G309 Alzheimer's disease, unspecified: Secondary | ICD-10-CM | POA: Diagnosis not present

## 2016-02-22 DIAGNOSIS — E039 Hypothyroidism, unspecified: Secondary | ICD-10-CM | POA: Diagnosis not present

## 2016-02-22 DIAGNOSIS — I10 Essential (primary) hypertension: Secondary | ICD-10-CM | POA: Diagnosis not present

## 2016-02-22 DIAGNOSIS — Z87891 Personal history of nicotine dependence: Secondary | ICD-10-CM | POA: Insufficient documentation

## 2016-02-22 DIAGNOSIS — E119 Type 2 diabetes mellitus without complications: Secondary | ICD-10-CM | POA: Diagnosis not present

## 2016-02-22 DIAGNOSIS — W19XXXA Unspecified fall, initial encounter: Secondary | ICD-10-CM

## 2016-02-22 DIAGNOSIS — Y92009 Unspecified place in unspecified non-institutional (private) residence as the place of occurrence of the external cause: Secondary | ICD-10-CM | POA: Insufficient documentation

## 2016-02-22 DIAGNOSIS — S0990XA Unspecified injury of head, initial encounter: Secondary | ICD-10-CM | POA: Diagnosis present

## 2016-02-22 LAB — URINALYSIS, ROUTINE W REFLEX MICROSCOPIC
Bilirubin Urine: NEGATIVE
Glucose, UA: NEGATIVE mg/dL
Hgb urine dipstick: NEGATIVE
Ketones, ur: NEGATIVE mg/dL
Leukocytes, UA: NEGATIVE
Nitrite: NEGATIVE
Protein, ur: NEGATIVE mg/dL
Specific Gravity, Urine: 1.02 (ref 1.005–1.030)
pH: 5.5 (ref 5.0–8.0)

## 2016-02-22 LAB — CBC
HEMATOCRIT: 43.9 % (ref 39.0–52.0)
HEMOGLOBIN: 14.3 g/dL (ref 13.0–17.0)
MCH: 29.5 pg (ref 26.0–34.0)
MCHC: 32.6 g/dL (ref 30.0–36.0)
MCV: 90.7 fL (ref 78.0–100.0)
Platelets: 125 10*3/uL — ABNORMAL LOW (ref 150–400)
RBC: 4.84 MIL/uL (ref 4.22–5.81)
RDW: 14.8 % (ref 11.5–15.5)
WBC: 5.7 10*3/uL (ref 4.0–10.5)

## 2016-02-22 LAB — BASIC METABOLIC PANEL
ANION GAP: 5 (ref 5–15)
BUN: 26 mg/dL — ABNORMAL HIGH (ref 6–20)
CALCIUM: 9.3 mg/dL (ref 8.9–10.3)
CO2: 29 mmol/L (ref 22–32)
CREATININE: 1.31 mg/dL — AB (ref 0.61–1.24)
Chloride: 105 mmol/L (ref 101–111)
GFR calc non Af Amer: 49 mL/min — ABNORMAL LOW (ref >60)
GFR, EST AFRICAN AMERICAN: 56 mL/min — AB (ref >60)
GLUCOSE: 140 mg/dL — AB (ref 65–99)
Potassium: 4.3 mmol/L (ref 3.5–5.1)
Sodium: 139 mmol/L (ref 135–145)

## 2016-02-22 MED ORDER — LIDOCAINE-EPINEPHRINE 1 %-1:100000 IJ SOLN
10.0000 mL | Freq: Once | INTRAMUSCULAR | Status: DC
  Filled 2016-02-22: qty 1

## 2016-02-22 MED ORDER — SODIUM CHLORIDE 0.9 % IV BOLUS (SEPSIS)
500.0000 mL | Freq: Once | INTRAVENOUS | Status: AC
  Administered 2016-02-22: 500 mL via INTRAVENOUS

## 2016-02-22 NOTE — ED Notes (Addendum)
EKG given to Dr. Ranae PalmsYelverton at [phone]

## 2016-02-22 NOTE — ED Notes (Signed)
Notified [facility] for transportation back home 

## 2016-02-22 NOTE — ED Provider Notes (Signed)
[facility]ENCY DEPT Provider Note   CSN: [id number] Arrival date & time: 02/22/16  [id number]     History   Chief Complaint Chief Complaint  Patient presents with  . Fall    HPI Christian Erickson is a 82 y.o. male.  The history is provided by the patient. No language interpreter was used.  Fall  This is a recurrent problem. Episode onset: prior to arrival. The problem occurs rarely. The problem has not changed (no distress) since onset.Pertinent negatives include no chest pain, no abdominal pain, no headaches and no shortness of breath. Nothing aggravates the symptoms. Nothing relieves the symptoms. He has tried nothing for the symptoms.    Past Medical History:  Diagnosis Date  . Allergic rhinitis   . Alzheimer's dementia   . Arthritis   . Blood dyscrasia   . BPH (benign prostatic hypertrophy)    nocturia  . Diabetes mellitus   . Diverticulitis   . ED (erectile dysfunction)   . Glaucoma    sees eye doctor routinely  . Hyperlipidemia   . Hypertension   . Subclinical hypothyroidism     Patient Active Problem List   Diagnosis Date Noted  . PCP NOTES >>>> 05/21/2015  . Mixed Alzheimer's and vascular dementia 10/04/2013  . Stroke (HCC) 07/23/2013  . Acute CVA (cerebrovascular accident) (HCC) 07/23/2013  . Loss of weight 12/11/2012  . Memory difficulty 05/26/2012  . Unspecified hypothyroidism 01/20/2012  . Medicare annual wellness visit, subsequent 01/06/2012  . Edema 01/06/2012  . BPH (benign prostatic hyperplasia) 09/05/2011  . DJD (degenerative joint disease) 06/24/2011  . ALLERGIC RHINITIS 09/10/2010  . BACK PAIN 08/21/2009  . DMII (diabetes mellitus, type 2) (HCC) 05/25/2009  . THROMBOCYTOPENIA 08/30/2008  . GLAUCOMA 08/30/2008  . ERECTILE DYSFUNCTION 01/25/2007  . Dyslipidemia 12/15/2006  . Essential hypertension 12/15/2006  . DIVERTICULITIS, HX OF 12/15/2006    Past Surgical History:  Procedure Laterality Date  . COLECTOMY     w/ reversal due to  divertiuclitis per patient in 1990s aprox  . INGUINAL HERNIA REPAIR    . sbo repair    . TOTAL KNEE ARTHROPLASTY Right 10/27/2012   Procedure: TOTAL KNEE ARTHROPLASTY;  Surgeon: Loreta Aveaniel F Murphy, MD;  Location: [facility];  Service: Orthopedics;  Laterality: Right;       Home Medications    Prior to Admission medications   Medication Sig Start Date End Date Taking? Authorizing Provider  acetaminophen (TYLENOL) 500 MG tablet Take 1,000 mg by mouth every 6 (six) hours as needed (pain).    Historical Provider, MD  ALPRAZolam Prudy Feeler(XANAX) 0.25 MG tablet Take 1 tablet (0.25 mg total) by mouth daily as needed for anxiety. Patient taking differently: Take 0.25 mg by mouth 2 (two) times daily.  05/14/15   Wanda PlumpJose E Paz, MD  atorvastatin (LIPITOR) 40 MG tablet Take 1 tablet (40 mg total) by mouth daily at 6 PM. 06/13/15   Wanda PlumpJose E Paz, MD  bimatoprost (LUMIGAN) 0.01 % SOLN Place 1 drop into both eyes at bedtime.    Historical Provider, MD  carvedilol (COREG) 12.5 MG tablet Take 1 tablet (12.5 mg total) by mouth 2 (two) times daily. Patient not taking: Reported on 10/22/2015 05/21/15   Wanda PlumpJose E Paz, MD  carvedilol (COREG) 25 MG tablet Take 25 mg by mouth 2 (two) times daily with a meal.    Historical Provider, MD  clopidogrel (PLAVIX) 75 MG tablet Take 1 tablet (75 mg total) by mouth daily with breakfast. 10/03/14   Wanda PlumpJose E Paz,  MD  dextromethorphan-guaiFENesin (MUCINEX DM) 30-600 MG per 12 hr tablet Take 1 tablet by mouth 2 (two) times daily as needed for cough. 12/26/14   Wanda Plump, MD  EPINEPHrine (EPIPEN 2-PAK) 0.3 mg/0.3 mL IJ SOAJ injection Inject 0.3 mLs (0.3 mg total) into the muscle once. 01/15/15   Wanda Plump, MD  feeding supplement, ENSURE ENLIVE, (ENSURE ENLIVE) LIQD Take 237 mLs by mouth daily.    Historical Provider, MD  fluticasone (FLONASE) 50 MCG/ACT nasal spray Place 2 sprays into both nostrils daily as needed for allergies or rhinitis. Patient taking differently: Place 2 sprays into both nostrils daily.   01/15/15   Wanda Plump, MD  folic acid (FOLVITE) 1 MG tablet Take 1 tablet (1 mg total) by mouth daily. Patient not taking: Reported on 10/22/2015 03/27/15   Wanda Plump, MD  folic acid (FOLVITE) 1 MG tablet Take 1 tablet (1 mg total) by mouth daily. Patient not taking: Reported on 10/22/2015 06/25/15   Wanda Plump, MD  hydrochlorothiazide (HYDRODIURIL) 25 MG tablet Take 25 mg by mouth daily.    Historical Provider, MD  levothyroxine (SYNTHROID, LEVOTHROID) 25 MCG tablet Take 1 tablet (25 mcg total) by mouth daily before breakfast. 06/12/15   Wanda Plump, MD  Loperamide HCl (IMODIUM A-D) 1 MG/7.5ML LIQD Take 30 mLs by mouth 3 (three) times daily as needed (diarrhea).     Historical Provider, MD  metFORMIN (GLUCOPHAGE) 500 MG tablet Take 1 tablet (500 mg total) by mouth 2 (two) times daily with a meal. 07/16/15   Wanda Plump, MD  Multiple Vitamin (MULTIVITAMIN WITH MINERALS) TABS tablet Take 1 tablet by mouth daily. 07/27/13   Ronzell Art, DO  NAMENDA 10 MG tablet TAKE 1 TABLET TWICE A DAY 11/24/14   Drema Dallas, DO  saxagliptin HCl (ONGLYZA) 5 MG TABS tablet Take 1 tablet (5 mg total) by mouth daily. 04/18/15   Wanda Plump, MD  senna-docusate (SENOKOT-S) 8.6-50 MG per tablet Take 1 tablet by mouth at bedtime as needed for moderate constipation. 07/27/13   Zaydan Art, DO  tamsulosin (FLOMAX) 0.4 MG CAPS capsule Take 1 capsule (0.4 mg total) by mouth daily. 11/30/14   Wanda Plump, MD  zolpidem (AMBIEN) 5 MG tablet Take 1 tablet (5 mg total) by mouth at bedtime as needed for sleep. Patient not taking: Reported on 05/21/2015 01/15/15   Wanda Plump, MD    Family History Family History  Problem Relation Age of Onset  . Lupus Sister   . Stroke Brother 42    CVA, no FH premature CAD  . Diabetes Other     granddaughter  . Colon cancer Neg Hx   . Prostate cancer Other     cousin, age 54  . CAD Mother     "enlarged heart"    Social History Social History  Substance Use Topics  . Smoking status: Former  Smoker    Types: Cigarettes  . Smokeless tobacco: Never Used     Comment: used to smoke rarely  . Alcohol use 0.0 oz/week     Comment: rarely     Allergies   Beef-derived products; Pork-derived products; Vasotec; and Other   Review of Systems Review of Systems  Constitutional: Negative for chills, diaphoresis and fever.  HENT: Negative.   Eyes: Negative.   Respiratory: Negative for cough and shortness of breath.   Cardiovascular: Negative for chest pain and palpitations.  Gastrointestinal: Negative for abdominal pain, diarrhea,  nausea and vomiting.  Genitourinary: Negative.   Musculoskeletal: Negative for back pain and joint swelling.  Skin: Negative.   Neurological: Negative for seizures, syncope, light-headedness, numbness and headaches.  Psychiatric/Behavioral: Positive for confusion. Negative for agitation and hallucinations. The patient is not nervous/anxious.      Physical Exam Updated Vital Signs BP 151/72 (BP Location: Right Arm)   Pulse (!) 47   Temp 98.2 F (36.8 C) (Oral)   Resp 16   SpO2 99%   Physical Exam  Constitutional: He appears well-developed and well-nourished. No distress.  Pleasant and cooperative  HENT:  Head: Normocephalic. Head is with laceration. Head is without raccoon's eyes, without Battle's sign, without abrasion, without contusion, without right periorbital erythema and without left periorbital erythema. Hair is normal.    Right Ear: External ear normal.  Left Ear: External ear normal.  Nose: Nose normal.  Mouth/Throat: Oropharynx is clear and moist.  Eyes: Conjunctivae and EOM are normal. Pupils are equal, round, and reactive to light. No scleral icterus.  Neck: Normal range of motion. Neck supple. No tracheal deviation present.  No midline neck pain. Not in C collar on arrival. No step offs  Cardiovascular: Regular rhythm, normal heart sounds and intact distal pulses.   No murmur heard. bradycardic  Pulmonary/Chest: Effort normal  and breath sounds normal. No stridor. No respiratory distress. He has no wheezes. He has no rales.  Abdominal: Soft. Bowel sounds are normal. He exhibits no distension. There is no tenderness. There is no rebound and no guarding.  Musculoskeletal: Normal range of motion. He exhibits no edema, tenderness or deformity.  Neurological: He is alert. He has normal strength. No cranial nerve deficit or sensory deficit. Coordination normal. GCS eye subscore is 4. GCS verbal subscore is 4. GCS motor subscore is 6.  Oriented to person, year, location and event. To month as well after correcting himself as well. (appears to be better than his baseline)  Skin: Skin is warm and dry. Capillary refill takes less than 2 seconds. He is not diaphoretic.  Psychiatric: He has a normal mood and affect. His behavior is normal. Judgment normal.  Nursing note and vitals reviewed.    ED Treatments / Results  Labs (all labs ordered are listed, but only abnormal results are displayed) Labs Reviewed  BASIC METABOLIC PANEL - Abnormal; Notable for the following:       Result Value   Glucose, Bld 140 (*)    BUN 26 (*)    Creatinine, Ser 1.31 (*)    GFR calc non Af Amer 49 (*)    GFR calc Af Amer 56 (*)    All other components within normal limits  CBC - Abnormal; Notable for the following:    Platelets 125 (*)    All other components within normal limits  URINALYSIS, ROUTINE W REFLEX MICROSCOPIC (NOT AT [facility])    EKG  EKG Interpretation None     No change from prior.  No signs of ST segment changes or TWI.throughout an anatomic distribution. Appears to be similar from prior  Radiology Ct Head Wo Contrast  Result Date: 02/22/2016 CLINICAL DATA:  Post fall this morning now with laceration involving the midline of the occiput. No loss of consciousness. Neck stiffness. EXAM: CT HEAD WITHOUT CONTRAST CT CERVICAL SPINE WITHOUT CONTRAST TECHNIQUE: Multidetector CT imaging of the head and cervical spine was  performed following the standard protocol without intravenous contrast. Multiplanar CT image reconstructions of the cervical spine were also generated. COMPARISON:  Head  and C-spine CT - 11/10/2015; 10/22/2015 ; head CT - 03/21/2015 FINDINGS: CT HEAD FINDINGS Regional soft tissues appear normal with special attention paid to the occiput. No radiopaque foreign body. No displaced calvarial fracture. Similar findings of advanced atrophy with sulcal prominence and prominence of the bifrontal extra-axial spaces. Rather extensive periventricular hypodensities compatible microvascular ischemic disease, grossly unchanged. Unchanged lacunar infarct within the left basal ganglia (image 13, series 3). No CT evidence of acute large territory infarct. No intraparenchymal or extra-axial mass or hemorrhage. Unchanged size and configuration of the ventricles and the basilar cisterns. No midline shift. Intracranial atherosclerosis. There is chronic opacification of the left frontal sinus. The remaining paranasal sinuses and mastoid air cells are normally aerated. No air-fluid levels. CT CERVICAL SPINE FINDINGS C1 to the inferior endplate of T2 is imaged. There is straightening and slight reversal of the expected cervical lordosis with mild kyphosis centered about the C5-C6 articulation, similar to the 11/2015 examination. No anterolisthesis or retrolisthesis. The bilateral facets are normally aligned. Normal atlantodental and atlantoaxial articulations. No fracture or static subluxation of cervical spine. Cervical vertebral body heights are preserved. Prevertebral soft tissues are normal. Moderate multilevel cervical spine DDD, worse at C5-C6 and C6-C7 with disc space height loss, endplate irregularity and small posteriorly directed disc osteophyte complexes at these locations. There is partial ossification involving the posterior aspect of the C3-C4 intervertebral disc space as well as suspected partial ossification of the  bilateral C3-C4 transverse facets. Regional soft tissues appear normal. No bulky cervical lymphadenopathy on this noncontrast examination. Normal noncontrast appearance of the thyroid gland. Limited visualization of the lung apices demonstrates minimal biapical pleural parenchymal thickening. IMPRESSION: 1. Similar findings of advanced atrophy and mild microvascular disease without acute intracranial process. 2. No fracture or static subluxation of the cervical spine. 3. Moderate multilevel cervical spine DDD. Electronically Signed   By: Simonne Come M.D.   On: 02/22/2016 11:57   Ct Cervical Spine Wo Contrast  Result Date: 02/22/2016 CLINICAL DATA:  Post fall this morning now with laceration involving the midline of the occiput. No loss of consciousness. Neck stiffness. EXAM: CT HEAD WITHOUT CONTRAST CT CERVICAL SPINE WITHOUT CONTRAST TECHNIQUE: Multidetector CT imaging of the head and cervical spine was performed following the standard protocol without intravenous contrast. Multiplanar CT image reconstructions of the cervical spine were also generated. COMPARISON:  Head and C-spine CT - 11/10/2015; 10/22/2015 ; head CT - 03/21/2015 FINDINGS: CT HEAD FINDINGS Regional soft tissues appear normal with special attention paid to the occiput. No radiopaque foreign body. No displaced calvarial fracture. Similar findings of advanced atrophy with sulcal prominence and prominence of the bifrontal extra-axial spaces. Rather extensive periventricular hypodensities compatible microvascular ischemic disease, grossly unchanged. Unchanged lacunar infarct within the left basal ganglia (image 13, series 3). No CT evidence of acute large territory infarct. No intraparenchymal or extra-axial mass or hemorrhage. Unchanged size and configuration of the ventricles and the basilar cisterns. No midline shift. Intracranial atherosclerosis. There is chronic opacification of the left frontal sinus. The remaining paranasal sinuses and  mastoid air cells are normally aerated. No air-fluid levels. CT CERVICAL SPINE FINDINGS C1 to the inferior endplate of T2 is imaged. There is straightening and slight reversal of the expected cervical lordosis with mild kyphosis centered about the C5-C6 articulation, similar to the 11/2015 examination. No anterolisthesis or retrolisthesis. The bilateral facets are normally aligned. Normal atlantodental and atlantoaxial articulations. No fracture or static subluxation of cervical spine. Cervical vertebral body heights are preserved. Prevertebral soft tissues  are normal. Moderate multilevel cervical spine DDD, worse at C5-C6 and C6-C7 with disc space height loss, endplate irregularity and small posteriorly directed disc osteophyte complexes at these locations. There is partial ossification involving the posterior aspect of the C3-C4 intervertebral disc space as well as suspected partial ossification of the bilateral C3-C4 transverse facets. Regional soft tissues appear normal. No bulky cervical lymphadenopathy on this noncontrast examination. Normal noncontrast appearance of the thyroid gland. Limited visualization of the lung apices demonstrates minimal biapical pleural parenchymal thickening. IMPRESSION: 1. Similar findings of advanced atrophy and mild microvascular disease without acute intracranial process. 2. No fracture or static subluxation of the cervical spine. 3. Moderate multilevel cervical spine DDD. Electronically Signed   By: Simonne Come M.D.   On: 02/22/2016 11:57    Procedures .Marland KitchenLaceration Repair Date/Time: 02/22/2016 1:00 PM Performed by: Maretta Bees Authorized by: Ranae Palms, DAVID   Consent:    Consent obtained:  Verbal   Consent given by:  Patient and spouse   Risks discussed:  Infection, need for additional repair, poor cosmetic result, pain and poor wound healing   Alternatives discussed:  No treatment Universal protocol:    Procedure explained and questions answered to patient or  proxy's satisfaction: yes     Imaging studies available: yes     Patient identity confirmed:  Arm band Anesthesia (see MAR for exact dosages):    Anesthesia method:  Local infiltration   Local anesthetic:  Lidocaine 1% WITH epi Laceration details:    Location:  Scalp   Scalp location:  Crown   Length (cm):  3   Depth (mm):  5 Repair type:    Repair type:  Simple Pre-procedure details:    Preparation:  Imaging obtained to evaluate for foreign bodies Exploration:    Hemostasis achieved with:  Epinephrine and direct pressure   Wound exploration: entire depth of wound probed and visualized     Wound extent: no fascia violation noted, no foreign bodies/material noted, no muscle damage noted, no nerve damage noted, no tendon damage noted, no underlying fracture noted and no vascular damage noted     Contaminated: no   Treatment:    Area cleansed with:  Saline   Amount of cleaning:  Standard   Irrigation solution:  Sterile saline   Irrigation volume:  250   Irrigation method:  Syringe Skin repair:    Repair method:  Staples   Number of staples:  2 Approximation:    Approximation:  Close Post-procedure details:    Dressing:  Antibiotic ointment   Patient tolerance of procedure:  Tolerated well, no immediate complications   (including critical care time)  Medications Ordered in ED Medications  sodium chloride 0.9 % bolus 500 mL (0 mLs Intravenous Stopped 02/22/16 1538)  Lidocaine with epi 1%   Initial Impression / Assessment and Plan / ED Course  I have reviewed the triage vital signs and the nursing notes.  Pertinent labs & imaging results that were available during my care of the patient were reviewed by me and considered in my medical decision making (see chart for details).  Clinical Course  82 year old male with a history of dementia, stroke, MI on anti-coagulation presents to the emergency department after a fall. Patient has a history of bradycardia, but otherwise VS on  arrival are WNL and patient appears well. Neuro exam WNL. Coordination intact. Will obtain head CT and C-spine and basic blood work/EKG/UA. Patient denies any other pain. NO infectious symptoms either. No back pain. Patient denies  any loss of consciousness. Has a history of frequent falls in the past. Denies a syncopal component to it. No incontinence or abnormal movement/shaking making seizure less likely. Wound was cleaned and repaired as above with Hemostasis easily achieved after CT scans returned with no acute findings. Blood work was unremarkable. Discussed plan and results with patient and wife. All questions answered. Patient and wife stated understanding and are in agreement with plan. Patient and VS stable at discharge. Usual and customary return precautions for falls and repaired laceration wound care discussed.   Final Clinical Impressions(s) / ED Diagnoses   Final diagnoses:  Fall, initial encounter  Scalp laceration, initial encounter    New Prescriptions Discharge Medication List as of 02/22/2016  2:43 PM       Maretta Bees, MD 02/24/16 [id number]    Loren Racer, MD 02/24/16 [id number]

## 2016-02-22 NOTE — ED Notes (Signed)
Performed wound care and irrigated wound at this time

## 2016-02-22 NOTE — Discharge Instructions (Signed)
Wash your head with a cloth for the next 24 hours. After then, you can wash your head as normal. Do not rub your wound site.   See a doctor or return to the ED if you have fevers or have yellow discharge with redness around the wound.   Staples should come out in 1 week on 8/25

## 2016-02-22 NOTE — ED Notes (Signed)
Pt bed linen changed and pt cleaned and placed in a clean brief.

## 2016-02-22 NOTE — ED Notes (Signed)
Replaced monitor leads that pt pulled off.

## 2016-02-22 NOTE — ED Notes (Signed)
Wound care performed at this time.  

## 2016-02-22 NOTE — ED Triage Notes (Addendum)
[facility].  Pt. Christian SeatFell out of his bed this morning when he was getting ready for the day.  He has a hx of dementia.  Pt. Denies any LOC.

## 2016-03-19 ENCOUNTER — Encounter (HOSPITAL_COMMUNITY): Payer: Self-pay | Admitting: Emergency Medicine

## 2016-03-19 ENCOUNTER — Emergency Department (HOSPITAL_COMMUNITY): Payer: Medicare Other

## 2016-03-19 ENCOUNTER — Emergency Department (HOSPITAL_BASED_OUTPATIENT_CLINIC_OR_DEPARTMENT_OTHER)
Admit: 2016-03-19 | Discharge: 2016-03-19 | Disposition: A | Discharge status: Home / Self Care | Payer: Medicare Other | Attending: Emergency Medicine | Admitting: Emergency Medicine

## 2016-03-19 ENCOUNTER — Emergency Department (HOSPITAL_COMMUNITY)
Admission: EM | Admit: 2016-03-19 | Discharge: 2016-03-19 | Disposition: A | Discharge status: Home / Self Care | Payer: Medicare Other | Source: Home / Self Care | Attending: Emergency Medicine | Admitting: Emergency Medicine

## 2016-03-19 DIAGNOSIS — I10 Essential (primary) hypertension: Secondary | ICD-10-CM

## 2016-03-19 DIAGNOSIS — R609 Edema, unspecified: Secondary | ICD-10-CM

## 2016-03-19 DIAGNOSIS — E039 Hypothyroidism, unspecified: Secondary | ICD-10-CM | POA: Insufficient documentation

## 2016-03-19 DIAGNOSIS — R600 Localized edema: Secondary | ICD-10-CM

## 2016-03-19 DIAGNOSIS — G309 Alzheimer's disease, unspecified: Secondary | ICD-10-CM | POA: Insufficient documentation

## 2016-03-19 DIAGNOSIS — E119 Type 2 diabetes mellitus without complications: Secondary | ICD-10-CM

## 2016-03-19 DIAGNOSIS — R7989 Other specified abnormal findings of blood chemistry: Secondary | ICD-10-CM | POA: Insufficient documentation

## 2016-03-19 DIAGNOSIS — M7989 Other specified soft tissue disorders: Secondary | ICD-10-CM | POA: Diagnosis not present

## 2016-03-19 DIAGNOSIS — Z87891 Personal history of nicotine dependence: Secondary | ICD-10-CM | POA: Insufficient documentation

## 2016-03-19 LAB — I-STAT TROPONIN, ED: TROPONIN I, POC: 0.01 ng/mL (ref 0.00–0.08)

## 2016-03-19 LAB — COMPREHENSIVE METABOLIC PANEL
ALT: 41 U/L (ref 17–63)
AST: 30 U/L (ref 15–41)
Albumin: 3.1 g/dL — ABNORMAL LOW (ref 3.5–5.0)
Alkaline Phosphatase: 84 U/L (ref 38–126)
Anion gap: 7 (ref 5–15)
BILIRUBIN TOTAL: 0.5 mg/dL (ref 0.3–1.2)
BUN: 27 mg/dL — AB (ref 6–20)
CALCIUM: 9 mg/dL (ref 8.9–10.3)
CHLORIDE: 101 mmol/L (ref 101–111)
CO2: 29 mmol/L (ref 22–32)
CREATININE: 1.35 mg/dL — AB (ref 0.61–1.24)
GFR, EST AFRICAN AMERICAN: 54 mL/min — AB (ref >60)
GFR, EST NON AFRICAN AMERICAN: 47 mL/min — AB (ref >60)
Glucose, Bld: 62 mg/dL — ABNORMAL LOW (ref 65–99)
Potassium: 4 mmol/L (ref 3.5–5.1)
Sodium: 137 mmol/L (ref 135–145)
TOTAL PROTEIN: 7.3 g/dL (ref 6.5–8.1)

## 2016-03-19 LAB — URINALYSIS, ROUTINE W REFLEX MICROSCOPIC
BILIRUBIN URINE: NEGATIVE
Glucose, UA: NEGATIVE mg/dL
HGB URINE DIPSTICK: NEGATIVE
KETONES UR: NEGATIVE mg/dL
Leukocytes, UA: NEGATIVE
NITRITE: NEGATIVE
PROTEIN: NEGATIVE mg/dL
Specific Gravity, Urine: 1.007 (ref 1.005–1.030)
pH: 6.5 (ref 5.0–8.0)

## 2016-03-19 LAB — CBC WITH DIFFERENTIAL/PLATELET
BASOS ABS: 0 10*3/uL (ref 0.0–0.1)
Basophils Relative: 0 %
EOS PCT: 8 %
Eosinophils Absolute: 0.5 10*3/uL (ref 0.0–0.7)
HEMATOCRIT: 39.2 % (ref 39.0–52.0)
Hemoglobin: 13.3 g/dL (ref 13.0–17.0)
LYMPHS ABS: 1.1 10*3/uL (ref 0.7–4.0)
LYMPHS PCT: 18 %
MCH: 29.7 pg (ref 26.0–34.0)
MCHC: 33.9 g/dL (ref 30.0–36.0)
MCV: 87.5 fL (ref 78.0–100.0)
MONO ABS: 0.6 10*3/uL (ref 0.1–1.0)
Monocytes Relative: 11 %
NEUTROS ABS: 3.6 10*3/uL (ref 1.7–7.7)
Neutrophils Relative %: 63 %
PLATELETS: 129 10*3/uL — AB (ref 150–400)
RBC: 4.48 MIL/uL (ref 4.22–5.81)
RDW: 14.2 % (ref 11.5–15.5)
WBC: 5.8 10*3/uL (ref 4.0–10.5)

## 2016-03-19 LAB — TSH: TSH: 2.31 u[IU]/mL (ref 0.350–4.500)

## 2016-03-19 LAB — BRAIN NATRIURETIC PEPTIDE: B Natriuretic Peptide: 150.3 pg/mL — ABNORMAL HIGH (ref 0.0–100.0)

## 2016-03-19 MED ORDER — HYDROCHLOROTHIAZIDE 25 MG PO TABS: 25.0000 mg | ORAL_TABLET | Freq: Two times a day (BID) | ORAL | 0 refills | Status: DC

## 2016-03-19 MED ORDER — FUROSEMIDE 40 MG PO TABS
20.0000 mg | ORAL_TABLET | Freq: Once | ORAL | Status: AC
  Administered 2016-03-19: 20 mg via ORAL
  Filled 2016-03-19: qty 1

## 2016-03-19 MED ORDER — FUROSEMIDE 20 MG PO TABS: 20.0000 mg | ORAL_TABLET | Freq: Every day | ORAL | 0 refills | Status: DC

## 2016-03-19 NOTE — ED Notes (Signed)
Off floor for testing 

## 2016-03-19 NOTE — ED Triage Notes (Signed)
Per EMS-swelling to left hand-nursing facility called EMS due to patient being "altered"-nurse at facility said he didn't recognize her and closed his eyes for 30 seconds while she was assessing him-patient has history of dementia and is at baseline-oriented to self and place-nursing facility states he is at his baseline mentally-no evidence of fall-unaware why left hand is swollen

## 2016-03-19 NOTE — Progress Notes (Signed)
VASCULAR LAB PRELIMINARY  PRELIMINARY  PRELIMINARY  PRELIMINARY  Bilateral lower extremity venous duplex completed.    Preliminary report:  Bilateral:  No evidence of DVT, superficial thrombosis, or Baker's Cyst.   Christian Erickson, RVS 03/19/2016, 8:14 PM

## 2016-03-19 NOTE — ED Notes (Signed)
Bed: WA21 Expected date:  Expected time:  Means of arrival:  Comments: Hold for hall C

## 2016-03-19 NOTE — ED Notes (Signed)
[facility] here to transport pt ?

## 2016-03-19 NOTE — ED Notes (Signed)
Pt still is not able to urinate at this time, Clinical research associatewriter provided pt with orange juice and a [place] sandwich.

## 2016-03-19 NOTE — ED Provider Notes (Addendum)
[facility]-EMERGENCY DEPT Provider Note   CSN: [id number] Arrival date & time: 03/19/16  1351     History   Chief Complaint Chief Complaint  Patient presents with  . swelling to hand    HPI Christian Erickson is a 82 y.o. male.  HPI 82 year old male with past medical history of dementia, diabetes, hypertension, hyperlipidemia, who presents with swelling of his face, left hand and left leg. Patient has a history of recurrent falls. History is severely limited due to dementia. Patient is unable recall what happened. According to the wife and report from the nursing facility, the patient has been increasingly confused over the last several days. He had a fall approximately a week and a half ago but has recovered from that. Last several days he has been more irritated as well as drowsy. When they evaluated him this morning he had moderate swelling of his face as well as left hand and left leg. He also had "twitching" of his left arm. No known falls, but patient has fallen in his room without reporting it in the past. Currently, the patient denies any complaints. Wife denies any recent medication changes or known falls.  Past Medical History:  Diagnosis Date  . Allergic rhinitis   . Alzheimer's dementia   . Arthritis   . Blood dyscrasia   . BPH (benign prostatic hypertrophy)    nocturia  . Diabetes mellitus   . Diverticulitis   . ED (erectile dysfunction)   . Glaucoma    sees eye doctor routinely  . Hyperlipidemia   . Hypertension   . Subclinical hypothyroidism     Patient Active Problem List   Diagnosis Date Noted  . PCP NOTES >>>> 05/21/2015  . Mixed Alzheimer's and vascular dementia 10/04/2013  . Stroke (HCC) 07/23/2013  . Acute CVA (cerebrovascular accident) (HCC) 07/23/2013  . Loss of weight 12/11/2012  . Memory difficulty 05/26/2012  . Unspecified hypothyroidism 01/20/2012  . Medicare annual wellness visit, subsequent 01/06/2012  . Edema 01/06/2012  . BPH (benign prostatic  hyperplasia) 09/05/2011  . DJD (degenerative joint disease) 06/24/2011  . ALLERGIC RHINITIS 09/10/2010  . BACK PAIN 08/21/2009  . DMII (diabetes mellitus, type 2) (HCC) 05/25/2009  . THROMBOCYTOPENIA 08/30/2008  . GLAUCOMA 08/30/2008  . ERECTILE DYSFUNCTION 01/25/2007  . Dyslipidemia 12/15/2006  . Essential hypertension 12/15/2006  . DIVERTICULITIS, HX OF 12/15/2006    Past Surgical History:  Procedure Laterality Date  . COLECTOMY     w/ reversal due to divertiuclitis per patient in 1990s aprox  . INGUINAL HERNIA REPAIR    . sbo repair    . TOTAL KNEE ARTHROPLASTY Right 10/27/2012   Procedure: TOTAL KNEE ARTHROPLASTY;  Surgeon: Loreta Ave, MD;  Location: [facility] OR;  Service: Orthopedics;  Laterality: Right;       Home Medications    Prior to Admission medications   Medication Sig Start Date End Date Taking? Authorizing Provider  acetaminophen (MAPAP) 500 MG tablet Take 1,000 mg by mouth every 6 (six) hours as needed (For pain.).   Yes Historical Provider, MD  atorvastatin (LIPITOR) 40 MG tablet Take 1 tablet (40 mg total) by mouth daily at 6 PM. 06/13/15  Yes Wanda Plump, MD  bimatoprost (LUMIGAN) 0.01 % SOLN Place 1 drop into both eyes at bedtime.   Yes Historical Provider, MD  carvedilol (COREG) 25 MG tablet Take 25 mg by mouth 2 (two) times daily with a meal.   Yes Historical Provider, MD  clopidogrel (PLAVIX) 75 MG tablet Take  1 tablet (75 mg total) by mouth daily with breakfast. Patient taking differently: Take 75 mg by mouth daily.  10/03/14  Yes Wanda Plump, MD  EPINEPHrine (EPIPEN 2-PAK) 0.3 mg/0.3 mL IJ SOAJ injection Inject 0.3 mLs (0.3 mg total) into the muscle once. Patient taking differently: Inject 0.3 mg into the muscle as needed (For anaphylaxis.).  01/15/15  Yes Wanda Plump, MD  fluticasone [facility]) 50 MCG/ACT nasal spray Place 2 sprays into both nostrils daily as needed for allergies or rhinitis. Patient taking differently: Place 2 sprays into both nostrils daily.   01/15/15  Yes Wanda Plump, MD  glipiZIDE (GLUCOTROL) 10 MG tablet Take 10 mg by mouth daily. 03/17/16  Yes Historical Provider, MD  levothyroxine (SYNTHROID, LEVOTHROID) 25 MCG tablet Take 1 tablet (25 mcg total) by mouth daily before breakfast. 06/12/15  Yes Wanda Plump, MD  Loperamide HCl (IMODIUM A-D) 1 MG/7.5ML LIQD Take 30 mLs by mouth 3 (three) times daily as needed (For loose stool.).    Yes Historical Provider, MD  Multiple Vitamin (MULTIVITAMIN WITH MINERALS) TABS tablet Take 1 tablet by mouth daily. 07/27/13  Yes Lewayne Art, DO  NAMENDA 10 MG tablet TAKE 1 TABLET TWICE A DAY 11/24/14  Yes Drema Dallas, DO  Nutritional Supplements (ENSURE PO) Take 237 mLs by mouth daily.   Yes Historical Provider, MD  QUEtiapine (SEROQUEL) 25 MG tablet Take 25 mg by mouth 2 (two) times daily.    Yes Historical Provider, MD  saxagliptin HCl (ONGLYZA) 5 MG TABS tablet Take 1 tablet (5 mg total) by mouth daily. 04/18/15  Yes Wanda Plump, MD  senna-docusate (SENEXON-S) 8.6-50 MG tablet Take 1 tablet by mouth at bedtime as needed (For constipation.).   Yes Historical Provider, MD  tamsulosin (FLOMAX) 0.4 MG CAPS capsule Take 1 capsule (0.4 mg total) by mouth daily. 11/30/14  Yes Wanda Plump, MD  furosemide (LASIX) 20 MG tablet Take 1 tablet (20 mg total) by mouth daily. 03/19/16 03/21/16  Shaune Pollack, MD  metFORMIN (GLUCOPHAGE) 500 MG tablet Take 1 tablet (500 mg total) by mouth 2 (two) times daily with a meal. Patient not taking: Reported on 03/19/2016 07/16/15   Wanda Plump, MD    Family History Family History  Problem Relation Age of Onset  . Lupus Sister   . Stroke Brother 77    CVA, no FH premature CAD  . Diabetes Other     granddaughter  . Prostate cancer Other     cousin, age 56  . CAD Mother     "enlarged heart"  . Colon cancer Neg Hx     Social History Social History  Substance Use Topics  . Smoking status: Former Smoker    Types: Cigarettes  . Smokeless tobacco: Never Used     Comment: used  to smoke rarely  . Alcohol use 0.0 oz/week     Comment: rarely     Allergies   Beef-derived products; Pork-derived products; Vasotec; and Other   Review of Systems Review of Systems  Unable to perform ROS: Dementia     Physical Exam Updated Vital Signs BP 198/72 (BP Location: Left Arm)   Pulse 64   Resp 18   SpO2 98%   Physical Exam  Constitutional: He appears well-developed and well-nourished. No distress.  HENT:  Head: Normocephalic and atraumatic.  No apparent trauma to the head or neck.  Eyes: Conjunctivae are normal.  Neck: Normal range of motion. Neck supple.  No  midline or paraspinal tenderness  Cardiovascular: Normal rate, regular rhythm and normal heart sounds.  Exam reveals no friction rub.   No murmur heard. Pulmonary/Chest: Effort normal and breath sounds normal. No respiratory distress. He has no wheezes. He has no rales.  Abdominal: Soft. Bowel sounds are normal. He exhibits no distension. There is no tenderness.  Musculoskeletal: He exhibits edema (Left greater than right, 2+ pitting edema of bilateral lower extremities.).  No bony tenderness throughout the upper or lower extremity as bilaterally. No swelling of the hands noted at this time.  Neurological: He is alert. He has normal strength. He is disoriented. No cranial nerve deficit or sensory deficit. He exhibits normal muscle tone. Gait normal. GCS eye subscore is 4. GCS verbal subscore is 5. GCS motor subscore is 6.  Skin: Skin is warm. Capillary refill takes less than 2 seconds.  Psychiatric: He has a normal mood and affect.  Nursing note and vitals reviewed.    ED Treatments / Results  Labs (all labs ordered are listed, but only abnormal results are displayed) Labs Reviewed  CBC WITH DIFFERENTIAL/PLATELET - Abnormal; Notable for the following:       Result Value   Platelets 129 (*)    All other components within normal limits  COMPREHENSIVE METABOLIC PANEL - Abnormal; Notable for the  following:    Glucose, Bld 62 (*)    BUN 27 (*)    Creatinine, Ser 1.35 (*)    Albumin 3.1 (*)    GFR calc non Af Amer 47 (*)    GFR calc Af Amer 54 (*)    All other components within normal limits  BRAIN NATRIURETIC PEPTIDE - Abnormal; Notable for the following:    B Natriuretic Peptide 150.3 (*)    All other components within normal limits  URINALYSIS, ROUTINE W REFLEX MICROSCOPIC (NOT AT [facility])  TSH  I-STAT TROPOININ, ED    EKG  EKG Interpretation  Date/Time:  Wednesday March 19 2016 15:36:15 EDT Ventricular Rate:  49 PR Interval:    QRS Duration: 121 QT Interval:  425 QTC Calculation: 384 R Axis:   23 Text Interpretation:  Sinus bradycardia Nonspecific intraventricular conduction delay Borderline ST elevation, anterior leads Baseline wander in lead(s) V3 V4 V5 No significant change since last tracing Confirmed by Jazziel Fitzsimmons MD, Jan Walters [phone]) on 03/19/2016 3:38:55 PM       Radiology Dg Chest 2 View  Result Date: 03/19/2016 CLINICAL DATA:  Altered mental status. EXAM: CHEST  2 VIEW COMPARISON:  07/23/2013 FINDINGS: The heart size and mediastinal contours are within normal limits. Both lungs are clear. The visualized skeletal structures are unremarkable. IMPRESSION: No active cardiopulmonary disease. Electronically Signed   By: Elige Ko   On: 03/19/2016 16:34   Ct Head Wo Contrast  Result Date: 03/19/2016 CLINICAL DATA:  82 year old male with altered mental status. Initial encounter. EXAM: CT HEAD WITHOUT CONTRAST TECHNIQUE: Contiguous axial images were obtained from the base of the skull through the vertex without intravenous contrast. COMPARISON:  Head CT without contrast 02/22/2016 and earlier. FINDINGS: Brain: Stable gray-white matter differentiation throughout the brain. Patchy cerebral white matter hypodensity and evidence of a chronic lacunar infarct at the left cauda thalamic groove again noted. Stable cerebral volume. No midline shift, mass effect, or evidence of  intracranial mass lesion. No ventriculomegaly. No acute intracranial hemorrhage identified. No cortically based acute infarct identified. Vascular: Calcified atherosclerosis at the skull base. No suspicious intracranial vascular hyperdensity. Skull: No acute osseous abnormality identified. Sinuses/Orbits: Stable, chronic  left frontal sinus opacification. Other: No acute orbit or scalp soft tissue finding. IMPRESSION: Stable non contrast CT appearance of the brain. No acute intracranial abnormality. Electronically Signed   By: Odessa FlemingH  Hall M.D.   On: 03/19/2016 15:11   Dg Hand Complete Left  Result Date: 03/19/2016 CLINICAL DATA:  Left 10 swelling of unknown etiology. No known injury. EXAM: LEFT HAND - COMPLETE 3+ VIEW COMPARISON:  None. FINDINGS: No acute bony or joint abnormality is seen. Moderate first CMC osteoarthritis is noted. Soft tissues are unremarkable. IMPRESSION: No acute abnormality. Moderate first CMC osteoarthritis. Electronically Signed   By: Drusilla Kannerhomas  Dalessio M.D.   On: 03/19/2016 16:33   Dg Hip Unilat W Or Wo Pelvis 2-3 Views Left  Result Date: 03/19/2016 CLINICAL DATA:  Left leg swelling. EXAM: DG HIP (WITH OR WITHOUT PELVIS) 2-3V LEFT COMPARISON:  None. FINDINGS: Frontal pelvis shows no fracture. SI joints and symphysis pubis are unremarkable. AP and frog-leg lateral views of the left hip are without evidence for femoral neck fracture. IMPRESSION: Negative. Electronically Signed   By: Kennith CenterEric  Mansell M.D.   On: 03/19/2016 16:30    Procedures Procedures (including critical care time)  Medications Ordered in ED Medications  furosemide (LASIX) tablet 20 mg (20 mg Oral Given 03/19/16 1830)     Initial Impression / Assessment and Plan / ED Course  I have reviewed the triage vital signs and the nursing notes.  Pertinent labs & imaging results that were available during my care of the patient were reviewed by me and considered in my medical decision making (see chart for  details).  Clinical Course   82 yo M with PMHx of HTN, HLD, dementia, who p/w LE edema, facial swelling, and generalized weakness. No known PMHx of CHF but pt previously on diuretics per report. On arrival, VS are stable and WNL. Pt hypertensive to 190s systolic. He appears hypervolemic on examination. Etiology of swelling/weakness unclear at this time - pt does appear to have peripheral edema, c/w hypervolemia - will check broad labs to eval for renal dysfunction, liver failure, hypothyroidism, CHF exacerbation. Denies any CP and screening trop negative. Will also check UA, CXR for occult infection. Pt also has h/o falls, will check CT head, XR of areas of reported TTP. Of note, pt also noted to have intermittent, possibly myoclonic jerks - will check lytes, CT as above. No sezure activity and pt alert, oriented during episodes. No LOC.  Lab work reviewed as above and is overall very reassuring. CBC with no leukocytosis or anemia. CMP with normal lytes, baseline renal function (mild CKD). Trop negative. BNP elevated at 150. TSH normal. CXR clear. Pt intermittently bradycardic but this is baseline per review of EKGs. CT head negative, plain films negative. Given asymmetric edema, LE duplex also obtained and is negative.  Discussed lab work, plan with wife in detail. Pt had previously been on diuretic with his old PCP. GIven hypervolemia on exam with leg pain 2/2 his edema, will trial one time dose of lasix 20 mg PO here in ED. I have provided a script for 2 more days but advised wife to not fill until discussing with his PCP tomorrow AM on the telephone. Pt is hypertensive but this is baseline - will need to f/u with his PCP. He remains o/w HDS, well-appearing, and at baseline. He has remained HDS throughout his ED stay. No "jerking" episodes noted in ED, doubt seizure activity, will need to f/u with his PCP as well for this in  2-3 days.  Final Clinical Impressions(s) / ED Diagnoses   Final diagnoses:   Elevated brain natriuretic peptide (BNP) level  Peripheral edema    New Prescriptions Discharge Medication List as of 03/19/2016  8:38 PM    START taking these medications   Details  furosemide (LASIX) 20 MG tablet Take 1 tablet (20 mg total) by mouth daily., Starting Wed 03/19/2016, Until Fri 03/21/2016, Print         Shaune Pollack, MD 03/20/16 1149    Shaune Pollack, MD 03/20/16 1155

## 2016-03-19 NOTE — ED Notes (Signed)
Patient and family aware we need urine, urinal at bedside.

## 2016-03-20 ENCOUNTER — Inpatient Hospital Stay (HOSPITAL_COMMUNITY)
Admission: EM | Admit: 2016-03-20 | Discharge: 2016-03-25 | DRG: 312 | Disposition: A | Discharge status: Nursing Home (Includes ICF) | Payer: Medicare Other | Attending: Internal Medicine | Admitting: Internal Medicine

## 2016-03-20 ENCOUNTER — Encounter (HOSPITAL_COMMUNITY): Payer: Self-pay | Admitting: Nurse Practitioner

## 2016-03-20 DIAGNOSIS — Z833 Family history of diabetes mellitus: Secondary | ICD-10-CM

## 2016-03-20 DIAGNOSIS — Z66 Do not resuscitate: Secondary | ICD-10-CM | POA: Diagnosis present

## 2016-03-20 DIAGNOSIS — E1159 Type 2 diabetes mellitus with other circulatory complications: Secondary | ICD-10-CM | POA: Diagnosis not present

## 2016-03-20 DIAGNOSIS — I10 Essential (primary) hypertension: Secondary | ICD-10-CM | POA: Diagnosis present

## 2016-03-20 DIAGNOSIS — Z8042 Family history of malignant neoplasm of prostate: Secondary | ICD-10-CM

## 2016-03-20 DIAGNOSIS — E860 Dehydration: Secondary | ICD-10-CM

## 2016-03-20 DIAGNOSIS — F0150 Vascular dementia without behavioral disturbance: Secondary | ICD-10-CM | POA: Diagnosis present

## 2016-03-20 DIAGNOSIS — E119 Type 2 diabetes mellitus without complications: Secondary | ICD-10-CM

## 2016-03-20 DIAGNOSIS — N400 Enlarged prostate without lower urinary tract symptoms: Secondary | ICD-10-CM | POA: Diagnosis present

## 2016-03-20 DIAGNOSIS — R55 Syncope and collapse: Principal | ICD-10-CM | POA: Diagnosis present

## 2016-03-20 DIAGNOSIS — Z23 Encounter for immunization: Secondary | ICD-10-CM

## 2016-03-20 DIAGNOSIS — Z7951 Long term (current) use of inhaled steroids: Secondary | ICD-10-CM

## 2016-03-20 DIAGNOSIS — G309 Alzheimer's disease, unspecified: Secondary | ICD-10-CM | POA: Diagnosis not present

## 2016-03-20 DIAGNOSIS — R200 Anesthesia of skin: Secondary | ICD-10-CM

## 2016-03-20 DIAGNOSIS — Z823 Family history of stroke: Secondary | ICD-10-CM

## 2016-03-20 DIAGNOSIS — E039 Hypothyroidism, unspecified: Secondary | ICD-10-CM | POA: Diagnosis not present

## 2016-03-20 DIAGNOSIS — I639 Cerebral infarction, unspecified: Secondary | ICD-10-CM | POA: Diagnosis present

## 2016-03-20 DIAGNOSIS — Z8249 Family history of ischemic heart disease and other diseases of the circulatory system: Secondary | ICD-10-CM

## 2016-03-20 DIAGNOSIS — R2689 Other abnormalities of gait and mobility: Secondary | ICD-10-CM

## 2016-03-20 DIAGNOSIS — R001 Bradycardia, unspecified: Secondary | ICD-10-CM | POA: Diagnosis not present

## 2016-03-20 DIAGNOSIS — Z91018 Allergy to other foods: Secondary | ICD-10-CM

## 2016-03-20 DIAGNOSIS — Z7902 Long term (current) use of antithrombotics/antiplatelets: Secondary | ICD-10-CM

## 2016-03-20 DIAGNOSIS — Z888 Allergy status to other drugs, medicaments and biological substances status: Secondary | ICD-10-CM

## 2016-03-20 DIAGNOSIS — T447X5A Adverse effect of beta-adrenoreceptor antagonists, initial encounter: Secondary | ICD-10-CM

## 2016-03-20 DIAGNOSIS — M7989 Other specified soft tissue disorders: Secondary | ICD-10-CM | POA: Diagnosis present

## 2016-03-20 DIAGNOSIS — Z79899 Other long term (current) drug therapy: Secondary | ICD-10-CM

## 2016-03-20 DIAGNOSIS — Z8673 Personal history of transient ischemic attack (TIA), and cerebral infarction without residual deficits: Secondary | ICD-10-CM

## 2016-03-20 DIAGNOSIS — R600 Localized edema: Secondary | ICD-10-CM | POA: Diagnosis present

## 2016-03-20 DIAGNOSIS — N289 Disorder of kidney and ureter, unspecified: Secondary | ICD-10-CM | POA: Diagnosis present

## 2016-03-20 DIAGNOSIS — R262 Difficulty in walking, not elsewhere classified: Secondary | ICD-10-CM

## 2016-03-20 DIAGNOSIS — Z96651 Presence of right artificial knee joint: Secondary | ICD-10-CM | POA: Diagnosis present

## 2016-03-20 DIAGNOSIS — Z7984 Long term (current) use of oral hypoglycemic drugs: Secondary | ICD-10-CM

## 2016-03-20 DIAGNOSIS — Z87891 Personal history of nicotine dependence: Secondary | ICD-10-CM

## 2016-03-20 DIAGNOSIS — F0280 Dementia in other diseases classified elsewhere without behavioral disturbance: Secondary | ICD-10-CM | POA: Diagnosis present

## 2016-03-20 LAB — CBC WITH DIFFERENTIAL/PLATELET
Basophils Absolute: 0 10*3/uL (ref 0.0–0.1)
Basophils Relative: 0 %
Eosinophils Absolute: 0.3 10*3/uL (ref 0.0–0.7)
Eosinophils Relative: 5 %
HEMATOCRIT: 36.2 % — AB (ref 39.0–52.0)
HEMOGLOBIN: 12.3 g/dL — AB (ref 13.0–17.0)
LYMPHS ABS: 0.7 10*3/uL (ref 0.7–4.0)
Lymphocytes Relative: 12 %
MCH: 29.6 pg (ref 26.0–34.0)
MCHC: 34 g/dL (ref 30.0–36.0)
MCV: 87 fL (ref 78.0–100.0)
MONOS PCT: 10 %
Monocytes Absolute: 0.6 10*3/uL (ref 0.1–1.0)
NEUTROS ABS: 4.3 10*3/uL (ref 1.7–7.7)
NEUTROS PCT: 73 %
Platelets: 121 10*3/uL — ABNORMAL LOW (ref 150–400)
RBC: 4.16 MIL/uL — ABNORMAL LOW (ref 4.22–5.81)
RDW: 14.1 % (ref 11.5–15.5)
WBC: 5.9 10*3/uL (ref 4.0–10.5)

## 2016-03-20 LAB — BASIC METABOLIC PANEL
Anion gap: 6 (ref 5–15)
BUN: 25 mg/dL — ABNORMAL HIGH (ref 6–20)
CO2: 30 mmol/L (ref 22–32)
Calcium: 8.6 mg/dL — ABNORMAL LOW (ref 8.9–10.3)
Chloride: 101 mmol/L (ref 101–111)
Creatinine, Ser: 1.37 mg/dL — ABNORMAL HIGH (ref 0.61–1.24)
GFR calc Af Amer: 53 mL/min — ABNORMAL LOW (ref >60)
GFR calc non Af Amer: 46 mL/min — ABNORMAL LOW (ref >60)
Glucose, Bld: 220 mg/dL — ABNORMAL HIGH (ref 65–99)
Potassium: 3.8 mmol/L (ref 3.5–5.1)
Sodium: 137 mmol/L (ref 135–145)

## 2016-03-20 LAB — TROPONIN I: Troponin I: <0.03 ng/mL (ref <0.03)

## 2016-03-20 LAB — I-STAT TROPONIN, ED: Troponin i, poc: 0 ng/mL (ref 0.00–0.08)

## 2016-03-20 LAB — GLUCOSE, CAPILLARY
Glucose-Capillary: 128 mg/dL — ABNORMAL HIGH (ref 65–99)
Glucose-Capillary: 125 mg/dL — ABNORMAL HIGH (ref 65–99)

## 2016-03-20 MED ORDER — INSULIN ASPART 100 UNIT/ML ~~LOC~~ SOLN
0.0000 [IU] | Freq: Three times a day (TID) | SUBCUTANEOUS | Status: DC
  Administered 2016-03-20 – 2016-03-21 (×2): 1 [IU] via SUBCUTANEOUS
  Administered 2016-03-21 – 2016-03-22 (×3): 2 [IU] via SUBCUTANEOUS
  Administered 2016-03-22: 3 [IU] via SUBCUTANEOUS
  Administered 2016-03-23: 2 [IU] via SUBCUTANEOUS
  Administered 2016-03-23: 5 [IU] via SUBCUTANEOUS
  Administered 2016-03-24: 2 [IU] via SUBCUTANEOUS
  Administered 2016-03-24: 1 [IU] via SUBCUTANEOUS
  Administered 2016-03-24: 7 [IU] via SUBCUTANEOUS
  Administered 2016-03-25 (×3): 2 [IU] via SUBCUTANEOUS

## 2016-03-20 MED ORDER — QUETIAPINE FUMARATE 25 MG PO TABS
25.0000 mg | ORAL_TABLET | Freq: Every day | ORAL | Status: DC
  Administered 2016-03-20: 25 mg via ORAL
  Filled 2016-03-20: qty 1

## 2016-03-20 MED ORDER — INFLUENZA VAC SPLIT QUAD 0.5 ML IM SUSY
0.5000 mL | PREFILLED_SYRINGE | INTRAMUSCULAR | Status: AC
  Administered 2016-03-21: 0.5 mL via INTRAMUSCULAR
  Filled 2016-03-20 (×2): qty 0.5

## 2016-03-20 MED ORDER — ACETAMINOPHEN 500 MG PO TABS
1000.0000 mg | ORAL_TABLET | Freq: Four times a day (QID) | ORAL | Status: DC | PRN
  Administered 2016-03-21: 1000 mg via ORAL
  Filled 2016-03-20: qty 2

## 2016-03-20 MED ORDER — FLUTICASONE PROPIONATE 50 MCG/ACT NA SUSP
2.0000 | Freq: Every day | NASAL | Status: DC | PRN
  Filled 2016-03-20: qty 16

## 2016-03-20 MED ORDER — SENNOSIDES-DOCUSATE SODIUM 8.6-50 MG PO TABS: 1.0000 | ORAL_TABLET | Freq: Every evening | ORAL | Status: DC | PRN

## 2016-03-20 MED ORDER — SODIUM CHLORIDE 0.9 % IV BOLUS (SEPSIS)
1000.0000 mL | Freq: Once | INTRAVENOUS | Status: AC
  Administered 2016-03-20: 1000 mL via INTRAVENOUS

## 2016-03-20 MED ORDER — INSULIN ASPART 100 UNIT/ML ~~LOC~~ SOLN
0.0000 [IU] | Freq: Every day | SUBCUTANEOUS | Status: DC
Start: 2016-03-20 — End: 2016-03-25
  Administered 2016-03-22: 2 [IU] via SUBCUTANEOUS

## 2016-03-20 MED ORDER — LATANOPROST 0.005 % OP SOLN
1.0000 [drp] | Freq: Every day | OPHTHALMIC | Status: DC
  Administered 2016-03-20 – 2016-03-24 (×5): 1 [drp] via OPHTHALMIC
  Filled 2016-03-20: qty 2.5

## 2016-03-20 MED ORDER — ATORVASTATIN CALCIUM 40 MG PO TABS
40.0000 mg | ORAL_TABLET | Freq: Every day | ORAL | Status: DC
  Administered 2016-03-20 – 2016-03-25 (×6): 40 mg via ORAL
  Filled 2016-03-20 (×6): qty 1

## 2016-03-20 MED ORDER — LEVOTHYROXINE SODIUM 25 MCG PO TABS
25.0000 ug | ORAL_TABLET | Freq: Every day | ORAL | Status: DC
  Administered 2016-03-21 – 2016-03-25 (×5): 25 ug via ORAL
  Filled 2016-03-20 (×5): qty 1

## 2016-03-20 MED ORDER — CARVEDILOL 25 MG PO TABS: 12.5000 mg | ORAL_TABLET | Freq: Two times a day (BID) | ORAL | 0 refills | Status: DC

## 2016-03-20 MED ORDER — SODIUM CHLORIDE 0.9% FLUSH
3.0000 mL | Freq: Two times a day (BID) | INTRAVENOUS | Status: DC
  Administered 2016-03-20 – 2016-03-23 (×6): 3 mL via INTRAVENOUS

## 2016-03-20 MED ORDER — CLOPIDOGREL BISULFATE 75 MG PO TABS
75.0000 mg | ORAL_TABLET | Freq: Every day | ORAL | Status: DC
  Administered 2016-03-21 – 2016-03-25 (×5): 75 mg via ORAL
  Filled 2016-03-20 (×5): qty 1

## 2016-03-20 MED ORDER — ADULT MULTIVITAMIN W/MINERALS CH
1.0000 | ORAL_TABLET | Freq: Every day | ORAL | Status: DC
  Administered 2016-03-21 – 2016-03-25 (×5): 1 via ORAL
  Filled 2016-03-20 (×5): qty 1

## 2016-03-20 NOTE — ED Notes (Signed)
Bed: [facility] Expected date:  Expected time:  Means of arrival:  Comments: 

## 2016-03-20 NOTE — H&P (Addendum)
History and Physical    Christian Erickson:[id number] DOB: February 11, 1931 DOA: 03/20/2016  Referring MD/NP/PA: Clydene Pugh PCP: Willow Ora, MD Outpatient Specialists:  Patient coming from: Memory care unit  Chief Complaint: syncopal episode  HPI: Christian Erickson is a 82 y.o. male with medical history significant of dementia with occasional behavioral issues, DM, HTN and hypothyroidism.  Patient was in the ER yesterday with left leg numbness, unresponsive episode at lunch, and drowsiness. Work up was unrevealing and patient was d./c back to facility.  Today he was in his normal state of health when he had an episode of unresponsiveness at breakfast.  No seizure like activity and EMS was able to wake him up.  Per facility they believe that seroquel is a new medications and has been adjusted in last few months.    ED Course: In the ER, work up was remarkable for bradycardia, some renal insufficiency due to possible dehydration.  I discussed in detail with wife that patient would be in observation status and we may not be able to find a cause.    Review of Systems: all systems reviewed, negative unless stated above in HPI   Past Medical History:  Diagnosis Date  . Allergic rhinitis   . Alzheimer's dementia   . Arthritis   . Blood dyscrasia   . BPH (benign prostatic hypertrophy)    nocturia  . Diabetes mellitus   . Diverticulitis   . ED (erectile dysfunction)   . Glaucoma    sees eye doctor routinely  . Hyperlipidemia   . Hypertension   . Subclinical hypothyroidism     Past Surgical History:  Procedure Laterality Date  . COLECTOMY     w/ reversal due to divertiuclitis per patient in 1990s aprox  . INGUINAL HERNIA REPAIR    . sbo repair    . TOTAL KNEE ARTHROPLASTY Right 10/27/2012   Procedure: TOTAL KNEE ARTHROPLASTY;  Surgeon: Loreta Ave, MD;  Location: [facility] OR;  Service: Orthopedics;  Laterality: Right;     reports that he has quit smoking. His smoking use included Cigarettes. He has  never used smokeless tobacco. He reports that he drinks alcohol. He reports that he does not use drugs. lives in a memory care facility  Allergies  Allergen Reactions  . Beef-Derived Products Swelling  . Pork-Derived Products Swelling  . Vasotec Other (See Comments)    Angioedema, see OV 09-05-11  . Other Swelling and Other (See Comments)    veal    Family History  Problem Relation Age of Onset  . Lupus Sister   . Stroke Brother 43    CVA, no FH premature CAD  . Diabetes Other     granddaughter  . Prostate cancer Other     cousin, age 48  . CAD Mother     "enlarged heart"  . Colon cancer Neg Hx      Prior to Admission medications   Medication Sig Start Date End Date Taking? Authorizing Provider  acetaminophen (MAPAP) 500 MG tablet Take 1,000 mg by mouth every 6 (six) hours as needed (For pain.).   Yes Historical Provider, MD  atorvastatin (LIPITOR) 40 MG tablet Take 1 tablet (40 mg total) by mouth daily at 6 PM. 06/13/15  Yes Wanda Plump, MD  bimatoprost (LUMIGAN) 0.01 % SOLN Place 1 drop into both eyes at bedtime.   Yes Historical Provider, MD  clopidogrel (PLAVIX) 75 MG tablet Take 1 tablet (75 mg total) by mouth daily with breakfast. Patient taking  differently: Take 75 mg by mouth daily.  10/03/14  Yes Wanda Plump, MD  EPINEPHrine (EPIPEN 2-PAK) 0.3 mg/0.3 mL IJ SOAJ injection Inject 0.3 mLs (0.3 mg total) into the muscle once. Patient taking differently: Inject 0.3 mg into the muscle as needed (For anaphylaxis.).  01/15/15  Yes Wanda Plump, MD  fluticasone [facility]) 50 MCG/ACT nasal spray Place 2 sprays into both nostrils daily as needed for allergies or rhinitis. Patient taking differently: Place 2 sprays into both nostrils daily.  01/15/15  Yes Wanda Plump, MD  glipiZIDE (GLUCOTROL) 10 MG tablet Take 10 mg by mouth daily. 03/17/16  Yes Historical Provider, MD  hydrochlorothiazide (HYDRODIURIL) 25 MG tablet Take 25 mg by mouth daily.   Yes Historical Provider, MD  levothyroxine  (SYNTHROID, LEVOTHROID) 25 MCG tablet Take 1 tablet (25 mcg total) by mouth daily before breakfast. 06/12/15  Yes Wanda Plump, MD  Loperamide HCl (IMODIUM A-D) 1 MG/7.5ML LIQD Take 30 mLs by mouth 3 (three) times daily as needed (For loose stool.).    Yes Historical Provider, MD  Multiple Vitamin (MULTIVITAMIN WITH MINERALS) TABS tablet Take 1 tablet by mouth daily. 07/27/13  Yes Wali Art, DO  NAMENDA 10 MG tablet TAKE 1 TABLET TWICE A DAY 11/24/14  Yes Drema Dallas, DO  Nutritional Supplements (ENSURE PO) Take 237 mLs by mouth daily.   Yes Historical Provider, MD  QUEtiapine (SEROQUEL) 25 MG tablet Take 25 mg by mouth 2 (two) times daily.    Yes Historical Provider, MD  saxagliptin HCl (ONGLYZA) 5 MG TABS tablet Take 1 tablet (5 mg total) by mouth daily. 04/18/15  Yes Wanda Plump, MD  senna-docusate (SENEXON-S) 8.6-50 MG tablet Take 1 tablet by mouth at bedtime as needed (For constipation.).   Yes Historical Provider, MD  tamsulosin (FLOMAX) 0.4 MG CAPS capsule Take 1 capsule (0.4 mg total) by mouth daily. 11/30/14  Yes Wanda Plump, MD  carvedilol (COREG) 25 MG tablet Take 0.5 tablets (12.5 mg total) by mouth 2 (two) times daily with a meal. Please review this medication with your primary doctor on 03/24/16 03/20/16   Lyndal Pulley, MD  furosemide (LASIX) 20 MG tablet Take 1 tablet (20 mg total) by mouth daily. 03/19/16 03/21/16  Shaune Pollack, MD    Physical Exam: Vitals:   03/20/16 1200 03/20/16 1230 03/20/16 1244 03/20/16 1330  BP: 145/82 147/59 147/59 130/63  Pulse:   (!) 51 (!) 50  Resp: 15 13 18 13   SpO2:   100% 99%  Weight:      Height:          Constitutional: NAD- knows name but thinks he is in an uniform factory Vitals:   03/20/16 1200 03/20/16 1230 03/20/16 1244 03/20/16 1330  BP: 145/82 147/59 147/59 130/63  Pulse:   (!) 51 (!) 50  Resp: 15 13 18 13   SpO2:   100% 99%  Weight:      Height:       Eyes: PERRL, lids and conjunctivae normal ENMT: Mucous membranes are moist.  Posterior pharynx clear of any exudate or lesions.Normal dentition.  Neck: normal, supple, no masses, no thyromegaly Respiratory: clear to auscultation bilaterally, no wheezing, no crackles. Normal respiratory effort. No accessory muscle use.  Cardiovascular: rate in 40s, no murmurs / rubs / gallops. No extremity edema. 2+ pedal pulses. No carotid bruits.  Abdomen: no tenderness, no masses palpated. No hepatosplenomegaly. Bowel sounds positive.  Musculoskeletal: no clubbing / cyanosis. No joint deformity upper  and lower extremities. Good ROM, no contractures. Normal muscle tone.  Skin: no rashes, lesions, ulcers. No induration Neurologic: confused and unable too cooperate with full exam-- moves all 4 ext Psychiatric: awake and alert to person only    Labs on Admission: I have personally reviewed following labs and imaging studies  CBC:  Recent Labs Lab 03/19/16 1445 03/20/16 1024  WBC 5.8 5.9  NEUTROABS 3.6 4.3  HGB 13.3 12.3*  HCT 39.2 36.2*  MCV 87.5 87.0  PLT 129* 121*   Basic Metabolic Panel:  Recent Labs Lab 03/19/16 1445 03/20/16 1024  NA 137 137  K 4.0 3.8  CL 101 101  CO2 29 30  GLUCOSE 62* 220*  BUN 27* 25*  CREATININE 1.35* 1.37*  CALCIUM 9.0 8.6*   GFR: Estimated Creatinine Clearance: 39.5 mL/min (by C-G formula based on SCr of 1.37 mg/dL (H)). Liver Function Tests:  Recent Labs Lab 03/19/16 1445  AST 30  ALT 41  ALKPHOS 84  BILITOT 0.5  PROT 7.3  ALBUMIN 3.1*   No results for input(s): LIPASE, AMYLASE in the last 168 hours. No results for input(s): AMMONIA in the last 168 hours. Coagulation Profile: No results for input(s): INR, PROTIME in the last 168 hours. Cardiac Enzymes: No results for input(s): CKTOTAL, CKMB, CKMBINDEX, TROPONINI in the last 168 hours. BNP (last 3 results) No results for input(s): PROBNP in the last 8760 hours. HbA1C: No results for input(s): HGBA1C in the last 72 hours. CBG: No results for input(s): GLUCAP in the  last 168 hours. Lipid Profile: No results for input(s): CHOL, HDL, LDLCALC, TRIG, CHOLHDL, LDLDIRECT in the last 72 hours. Thyroid Function Tests:  Recent Labs  03/19/16 1550  TSH 2.310   Anemia Panel: No results for input(s): VITAMINB12, FOLATE, FERRITIN, TIBC, IRON, RETICCTPCT in the last 72 hours. Urine analysis:    Component Value Date/Time   COLORURINE YELLOW 03/19/2016 1433   APPEARANCEUR CLEAR 03/19/2016 1433   LABSPEC 1.007 03/19/2016 1433   PHURINE 6.5 03/19/2016 1433   GLUCOSEU NEGATIVE 03/19/2016 1433   GLUCOSEU NEGATIVE 01/21/2013 1040   HGBUR NEGATIVE 03/19/2016 1433   HGBUR small 05/11/2008 1059   BILIRUBINUR NEGATIVE 03/19/2016 1433   BILIRUBINUR neg 09/05/2011 1300   KETONESUR NEGATIVE 03/19/2016 1433   PROTEINUR NEGATIVE 03/19/2016 1433   UROBILINOGEN 0.2 03/14/2014 0838   NITRITE NEGATIVE 03/19/2016 1433   LEUKOCYTESUR NEGATIVE 03/19/2016 1433   Sepsis Labs: Invalid input(s): PROCALCITONIN, LACTICIDVEN No results found for this or any previous visit (from the past 240 hour(s)).   Radiological Exams on Admission: Dg Chest 2 View  Result Date: 03/19/2016 CLINICAL DATA:  Altered mental status. EXAM: CHEST  2 VIEW COMPARISON:  07/23/2013 FINDINGS: The heart size and mediastinal contours are within normal limits. Both lungs are clear. The visualized skeletal structures are unremarkable. IMPRESSION: No active cardiopulmonary disease. Electronically Signed   By: Elige Ko   On: 03/19/2016 16:34   Ct Head Wo Contrast  Result Date: 03/19/2016 CLINICAL DATA:  82 year old male with altered mental status. Initial encounter. EXAM: CT HEAD WITHOUT CONTRAST TECHNIQUE: Contiguous axial images were obtained from the base of the skull through the vertex without intravenous contrast. COMPARISON:  Head CT without contrast 02/22/2016 and earlier. FINDINGS: Brain: Stable gray-white matter differentiation throughout the brain. Patchy cerebral white matter hypodensity and  evidence of a chronic lacunar infarct at the left cauda thalamic groove again noted. Stable cerebral volume. No midline shift, mass effect, or evidence of intracranial mass lesion. No ventriculomegaly. No  acute intracranial hemorrhage identified. No cortically based acute infarct identified. Vascular: Calcified atherosclerosis at the skull base. No suspicious intracranial vascular hyperdensity. Skull: No acute osseous abnormality identified. Sinuses/Orbits: Stable, chronic left frontal sinus opacification. Other: No acute orbit or scalp soft tissue finding. IMPRESSION: Stable non contrast CT appearance of the brain. No acute intracranial abnormality. Electronically Signed   By: Odessa FlemingH  Hall M.D.   On: 03/19/2016 15:11   Dg Hand Complete Left  Result Date: 03/19/2016 CLINICAL DATA:  Left 10 swelling of unknown etiology. No known injury. EXAM: LEFT HAND - COMPLETE 3+ VIEW COMPARISON:  None. FINDINGS: No acute bony or joint abnormality is seen. Moderate first CMC osteoarthritis is noted. Soft tissues are unremarkable. IMPRESSION: No acute abnormality. Moderate first CMC osteoarthritis. Electronically Signed   By: Drusilla Kannerhomas  Dalessio M.D.   On: 03/19/2016 16:33   Dg Hip Unilat W Or Wo Pelvis 2-3 Views Left  Result Date: 03/19/2016 CLINICAL DATA:  Left leg swelling. EXAM: DG HIP (WITH OR WITHOUT PELVIS) 2-3V LEFT COMPARISON:  None. FINDINGS: Frontal pelvis shows no fracture. SI joints and symphysis pubis are unremarkable. AP and frog-leg lateral views of the left hip are without evidence for femoral neck fracture. IMPRESSION: Negative. Electronically Signed   By: Kennith CenterEric  Mansell M.D.   On: 03/19/2016 16:30    EKG: Independently reviewed. Sinus brady  Assessment/Plan Active Problems:   DMII (diabetes mellitus, type 2) (HCC)   Essential hypertension   Hypothyroidism   Mixed Alzheimer's and vascular dementia   Syncope   Bradycardia   Syncope with bradycardia -watch on tele -d/c coreg -d/c  namenda -orthostatics in AM  Hypothyroidism -TSH normal yesterday -resume home meds  Mild renal insuff -IVF gentle  Dementia -appears to be at baseline -has been placed on seroquel and family reports patient is more sleepy during the day--- will adjust medication to QHS only.  HTN -resume home meds  DM- type II -SSI -hold home meds -glucose appeared to be low yesterday when BMP was drawn  Discussed with nurse Melissa at facility to get more information    DVT prophylaxis: scd Code Status: DNR- discussed with wife---- family would benefit from a palliative care referral as outpatient for goals of care-- daughter in [place] having hard time with dementia diagnosis Family Communication: wife at bedside Disposition Plan:  Consults called:  Admission status: tele obs   JESSICA Juanetta GoslingU VANN DO [facility] Pager [phone]  If 7PM-7AM, please contact night-coverage [email or web address] Password [email or web address]  03/20/2016, 3:32 PM

## 2016-03-20 NOTE — ED Triage Notes (Signed)
Was in the ER yesterday for left leg numbness and edema. Has a history of dementia. Had an unresponsivness episode this morning after eating his breakfast. When EMS arrived they woke him up from sleeping and he responded to everyone. Lives in a nursing home.

## 2016-03-20 NOTE — Progress Notes (Signed)
Pt with pcp Dr Drue NovelPaz on facility forms EPIC updated

## 2016-03-20 NOTE — ED Provider Notes (Signed)
[facility]-EMERGENCY DEPT Provider Note   CSN: [id number] Arrival date & time: 03/20/16  9562     History   Chief Complaint Chief Complaint  Patient presents with  . Loss of Consciousness    HPI MARKEE MATERA is a 82 y.o. male.  The history is provided by the nursing home.  Loss of Consciousness   This is a new problem. The current episode started 1 to 2 hours ago. The problem occurs rarely. The problem has been resolved. He lost consciousness for a period of less than one minute. Associated with: eating breakfast at facility. Associated symptoms include confusion (chronic dementia) and weakness (global). Pertinent negatives include bladder incontinence, bowel incontinence, fever and slurred speech. He has tried nothing for the symptoms. His past medical history is significant for CVA.    Past Medical History:  Diagnosis Date  . Allergic rhinitis   . Alzheimer's dementia   . Arthritis   . Blood dyscrasia   . BPH (benign prostatic hypertrophy)    nocturia  . Diabetes mellitus   . Diverticulitis   . ED (erectile dysfunction)   . Glaucoma    sees eye doctor routinely  . Hyperlipidemia   . Hypertension   . Subclinical hypothyroidism     Patient Active Problem List   Diagnosis Date Noted  . PCP NOTES >>>> 05/21/2015  . Mixed Alzheimer's and vascular dementia 10/04/2013  . Stroke (HCC) 07/23/2013  . Acute CVA (cerebrovascular accident) (HCC) 07/23/2013  . Loss of weight 12/11/2012  . Memory difficulty 05/26/2012  . Unspecified hypothyroidism 01/20/2012  . Medicare annual wellness visit, subsequent 01/06/2012  . Edema 01/06/2012  . BPH (benign prostatic hyperplasia) 09/05/2011  . DJD (degenerative joint disease) 06/24/2011  . ALLERGIC RHINITIS 09/10/2010  . BACK PAIN 08/21/2009  . DMII (diabetes mellitus, type 2) (HCC) 05/25/2009  . THROMBOCYTOPENIA 08/30/2008  . GLAUCOMA 08/30/2008  . ERECTILE DYSFUNCTION 01/25/2007  . Dyslipidemia 12/15/2006  . Essential  hypertension 12/15/2006  . DIVERTICULITIS, HX OF 12/15/2006    Past Surgical History:  Procedure Laterality Date  . COLECTOMY     w/ reversal due to divertiuclitis per patient in 1990s aprox  . INGUINAL HERNIA REPAIR    . sbo repair    . TOTAL KNEE ARTHROPLASTY Right 10/27/2012   Procedure: TOTAL KNEE ARTHROPLASTY;  Surgeon: Loreta Ave, MD;  Location: [facility] OR;  Service: Orthopedics;  Laterality: Right;       Home Medications    Prior to Admission medications   Medication Sig Start Date End Date Taking? Authorizing Provider  acetaminophen (MAPAP) 500 MG tablet Take 1,000 mg by mouth every 6 (six) hours as needed (For pain.).   Yes Historical Provider, MD  atorvastatin (LIPITOR) 40 MG tablet Take 1 tablet (40 mg total) by mouth daily at 6 PM. 06/13/15  Yes Wanda Plump, MD  bimatoprost (LUMIGAN) 0.01 % SOLN Place 1 drop into both eyes at bedtime.   Yes Historical Provider, MD  clopidogrel (PLAVIX) 75 MG tablet Take 1 tablet (75 mg total) by mouth daily with breakfast. Patient taking differently: Take 75 mg by mouth daily.  10/03/14  Yes Wanda Plump, MD  EPINEPHrine (EPIPEN 2-PAK) 0.3 mg/0.3 mL IJ SOAJ injection Inject 0.3 mLs (0.3 mg total) into the muscle once. Patient taking differently: Inject 0.3 mg into the muscle as needed (For anaphylaxis.).  01/15/15  Yes Wanda Plump, MD  fluticasone [facility]) 50 MCG/ACT nasal spray Place 2 sprays into both nostrils daily as needed for allergies  or rhinitis. Patient taking differently: Place 2 sprays into both nostrils daily.  01/15/15  Yes Wanda Plump, MD  glipiZIDE (GLUCOTROL) 10 MG tablet Take 10 mg by mouth daily. 03/17/16  Yes Historical Provider, MD  hydrochlorothiazide (HYDRODIURIL) 25 MG tablet Take 25 mg by mouth daily.   Yes Historical Provider, MD  levothyroxine (SYNTHROID, LEVOTHROID) 25 MCG tablet Take 1 tablet (25 mcg total) by mouth daily before breakfast. 06/12/15  Yes Wanda Plump, MD  Loperamide HCl (IMODIUM A-D) 1 MG/7.5ML LIQD Take 30  mLs by mouth 3 (three) times daily as needed (For loose stool.).    Yes Historical Provider, MD  Multiple Vitamin (MULTIVITAMIN WITH MINERALS) TABS tablet Take 1 tablet by mouth daily. 07/27/13  Yes Jveon Art, DO  NAMENDA 10 MG tablet TAKE 1 TABLET TWICE A DAY 11/24/14  Yes Drema Dallas, DO  Nutritional Supplements (ENSURE PO) Take 237 mLs by mouth daily.   Yes Historical Provider, MD  QUEtiapine (SEROQUEL) 25 MG tablet Take 25 mg by mouth 2 (two) times daily.    Yes Historical Provider, MD  saxagliptin HCl (ONGLYZA) 5 MG TABS tablet Take 1 tablet (5 mg total) by mouth daily. 04/18/15  Yes Wanda Plump, MD  senna-docusate (SENEXON-S) 8.6-50 MG tablet Take 1 tablet by mouth at bedtime as needed (For constipation.).   Yes Historical Provider, MD  tamsulosin (FLOMAX) 0.4 MG CAPS capsule Take 1 capsule (0.4 mg total) by mouth daily. 11/30/14  Yes Wanda Plump, MD  carvedilol (COREG) 25 MG tablet Take 0.5 tablets (12.5 mg total) by mouth 2 (two) times daily with a meal. Please review this medication with your primary doctor on 03/24/16 03/20/16   Lyndal Pulley, MD  furosemide (LASIX) 20 MG tablet Take 1 tablet (20 mg total) by mouth daily. 03/19/16 03/21/16  Shaune Pollack, MD    Family History Family History  Problem Relation Age of Onset  . Lupus Sister   . Stroke Brother 52    CVA, no FH premature CAD  . Diabetes Other     granddaughter  . Prostate cancer Other     cousin, age 69  . CAD Mother     "enlarged heart"  . Colon cancer Neg Hx     Social History Social History  Substance Use Topics  . Smoking status: Former Smoker    Types: Cigarettes  . Smokeless tobacco: Never Used     Comment: used to smoke rarely  . Alcohol use 0.0 oz/week     Comment: rarely     Allergies   Beef-derived products; Pork-derived products; Vasotec; and Other   Review of Systems Review of Systems  Unable to perform ROS: Dementia  Constitutional: Negative for fever.  Cardiovascular: Positive for  syncope.  Gastrointestinal: Negative for bowel incontinence.  Genitourinary: Negative for bladder incontinence.  Neurological: Positive for weakness (global).  Psychiatric/Behavioral: Positive for confusion (chronic dementia).     Physical Exam Updated Vital Signs BP 130/63   Pulse (!) 50   Resp 13   Ht 5\' 8"  (1.727 m)   Wt 162 lb (73.5 kg)   SpO2 99%   BMI 24.63 kg/m   Physical Exam  Constitutional: He appears well-developed and well-nourished. No distress.  HENT:  Head: Normocephalic and atraumatic.  Eyes: Conjunctivae are normal.  Neck: Neck supple. No tracheal deviation present.  Cardiovascular: Regular rhythm and normal heart sounds.  Bradycardia present.   Pulmonary/Chest: Effort normal and breath sounds normal. No respiratory distress.  Abdominal:  Soft. He exhibits no distension.  Neurological: He is alert. He has normal strength. He is disoriented. No cranial nerve deficit. Coordination normal.  Skin: Skin is warm and dry.  Psychiatric: He has a normal mood and affect.  Vitals reviewed.    ED Treatments / Results  Labs (all labs ordered are listed, but only abnormal results are displayed) Labs Reviewed  CBC WITH DIFFERENTIAL/PLATELET - Abnormal; Notable for the following:       Result Value   RBC 4.16 (*)    Hemoglobin 12.3 (*)    HCT 36.2 (*)    Platelets 121 (*)    All other components within normal limits  BASIC METABOLIC PANEL - Abnormal; Notable for the following:    Glucose, Bld 220 (*)    BUN 25 (*)    Creatinine, Ser 1.37 (*)    Calcium 8.6 (*)    GFR calc non Af Amer 46 (*)    GFR calc Af Amer 53 (*)    All other components within normal limits  I-STAT TROPOININ, ED    EKG  EKG Interpretation  Date/Time:  Thursday March 20 2016 10:29:40 EDT Ventricular Rate:  48 PR Interval:    QRS Duration: 88 QT Interval:  448 QTC Calculation: 401 R Axis:   51 Text Interpretation:  Sinus bradycardia Probable left atrial enlargement Minimal ST  elevation, anterior leads No significant change since last tracing Confirmed by Murvin Gift MD, Mattheus Rauls [phone]([id number]) on 03/20/2016 12:00:12 PM       Radiology Dg Chest 2 View  Result Date: 03/19/2016 CLINICAL DATA:  Altered mental status. EXAM: CHEST  2 VIEW COMPARISON:  07/23/2013 FINDINGS: The heart size and mediastinal contours are within normal limits. Both lungs are clear. The visualized skeletal structures are unremarkable. IMPRESSION: No active cardiopulmonary disease. Electronically Signed   By: Elige KoHetal  Patel   On: 03/19/2016 16:34   Ct Head Wo Contrast  Result Date: 03/19/2016 CLINICAL DATA:  82 year old male with altered mental status. Initial encounter. EXAM: CT HEAD WITHOUT CONTRAST TECHNIQUE: Contiguous axial images were obtained from the base of the skull through the vertex without intravenous contrast. COMPARISON:  Head CT without contrast 02/22/2016 and earlier. FINDINGS: Brain: Stable gray-white matter differentiation throughout the brain. Patchy cerebral white matter hypodensity and evidence of a chronic lacunar infarct at the left cauda thalamic groove again noted. Stable cerebral volume. No midline shift, mass effect, or evidence of intracranial mass lesion. No ventriculomegaly. No acute intracranial hemorrhage identified. No cortically based acute infarct identified. Vascular: Calcified atherosclerosis at the skull base. No suspicious intracranial vascular hyperdensity. Skull: No acute osseous abnormality identified. Sinuses/Orbits: Stable, chronic left frontal sinus opacification. Other: No acute orbit or scalp soft tissue finding. IMPRESSION: Stable non contrast CT appearance of the brain. No acute intracranial abnormality. Electronically Signed   By: Odessa FlemingH  Hall M.D.   On: 03/19/2016 15:11   Dg Hand Complete Left  Result Date: 03/19/2016 CLINICAL DATA:  Left 10 swelling of unknown etiology. No known injury. EXAM: LEFT HAND - COMPLETE 3+ VIEW COMPARISON:  None. FINDINGS: No acute bony or joint  abnormality is seen. Moderate first CMC osteoarthritis is noted. Soft tissues are unremarkable. IMPRESSION: No acute abnormality. Moderate first CMC osteoarthritis. Electronically Signed   By: Drusilla Kannerhomas  Dalessio M.D.   On: 03/19/2016 16:33   Dg Hip Unilat W Or Wo Pelvis 2-3 Views Left  Result Date: 03/19/2016 CLINICAL DATA:  Left leg swelling. EXAM: DG HIP (WITH OR WITHOUT PELVIS) 2-3V LEFT COMPARISON:  None. FINDINGS:  Frontal pelvis shows no fracture. SI joints and symphysis pubis are unremarkable. AP and frog-leg lateral views of the left hip are without evidence for femoral neck fracture. IMPRESSION: Negative. Electronically Signed   By: Kennith Center M.D.   On: 03/19/2016 16:30    Procedures Procedures (including critical care time)  Medications Ordered in ED Medications  sodium chloride 0.9 % bolus 1,000 mL (0 mLs Intravenous Stopped 03/20/16 1301)     Initial Impression / Assessment and Plan / ED Course  I have reviewed the triage vital signs and the nursing notes.  Pertinent labs & imaging results that were available during my care of the patient were reviewed by me and considered in my medical decision making (see chart for details).  Clinical Course    82 y.o. male presents with syncopal episode this morning at breakfast after s[pending much of yesterday afternoon and night in the ED for unrelated reasons. Was administered his home meds and slumped over at breakfast. EMS wound Pt to be hypotensive with systolic in 80s, brought here and continues to have bradycardia and mild orthostasis. Suspect volume depletion from poor intake with prolonged outside of facility event yesterday with sensitization to heart rate controlled by beta blocker. Given IVF with recovery of pressure and stable heart rate. Plan will be for discharge and PCP f/u after cutting coreg dose in half through the weekend to re-evaluate vitals as Pt appears to have recovered.   Pt's wife refused to allow him to be  discharged and states she is concerned for his safety and possible stroke. I explained why stroke was unlikely given his symptoms and wife was reassured but insisted on admission for observation. Hospitalist was consulted for admission given family concerns and will see the patient in the emergency department.   Final Clinical Impressions(s) / ED Diagnoses   Final diagnoses:  Dehydration  Adverse reaction to beta-blocker, initial encounter  Syncope, unspecified syncope type    New Prescriptions Current Discharge Medication List       Lyndal Pulley, MD 03/20/16 2126

## 2016-03-21 ENCOUNTER — Observation Stay (HOSPITAL_COMMUNITY): Payer: Medicare Other

## 2016-03-21 ENCOUNTER — Observation Stay (HOSPITAL_BASED_OUTPATIENT_CLINIC_OR_DEPARTMENT_OTHER): Payer: Medicare Other

## 2016-03-21 DIAGNOSIS — I10 Essential (primary) hypertension: Secondary | ICD-10-CM | POA: Diagnosis not present

## 2016-03-21 DIAGNOSIS — I6789 Other cerebrovascular disease: Secondary | ICD-10-CM | POA: Diagnosis not present

## 2016-03-21 DIAGNOSIS — M7989 Other specified soft tissue disorders: Secondary | ICD-10-CM

## 2016-03-21 DIAGNOSIS — R208 Other disturbances of skin sensation: Secondary | ICD-10-CM

## 2016-03-21 DIAGNOSIS — I639 Cerebral infarction, unspecified: Secondary | ICD-10-CM | POA: Diagnosis not present

## 2016-03-21 DIAGNOSIS — R001 Bradycardia, unspecified: Secondary | ICD-10-CM | POA: Diagnosis not present

## 2016-03-21 LAB — BASIC METABOLIC PANEL
ANION GAP: 6 (ref 5–15)
BUN: 24 mg/dL — ABNORMAL HIGH (ref 6–20)
CO2: 29 mmol/L (ref 22–32)
Calcium: 8.9 mg/dL (ref 8.9–10.3)
Chloride: 101 mmol/L (ref 101–111)
Creatinine, Ser: 1.29 mg/dL — ABNORMAL HIGH (ref 0.61–1.24)
GFR, EST AFRICAN AMERICAN: 57 mL/min — AB (ref >60)
GFR, EST NON AFRICAN AMERICAN: 50 mL/min — AB (ref >60)
Glucose, Bld: 193 mg/dL — ABNORMAL HIGH (ref 65–99)
POTASSIUM: 3.8 mmol/L (ref 3.5–5.1)
SODIUM: 136 mmol/L (ref 135–145)

## 2016-03-21 LAB — ECHOCARDIOGRAM COMPLETE
Height: 68 in
WEIGHTICAEL: 2592 [oz_av]

## 2016-03-21 LAB — CBC
HEMATOCRIT: 39.2 % (ref 39.0–52.0)
HEMOGLOBIN: 13.1 g/dL (ref 13.0–17.0)
MCH: 28.9 pg (ref 26.0–34.0)
MCHC: 33.4 g/dL (ref 30.0–36.0)
MCV: 86.5 fL (ref 78.0–100.0)
Platelets: 120 10*3/uL — ABNORMAL LOW (ref 150–400)
RBC: 4.53 MIL/uL (ref 4.22–5.81)
RDW: 14.1 % (ref 11.5–15.5)
WBC: 6.3 10*3/uL (ref 4.0–10.5)

## 2016-03-21 LAB — MRSA PCR SCREENING: MRSA BY PCR: NEGATIVE

## 2016-03-21 LAB — GLUCOSE, CAPILLARY
GLUCOSE-CAPILLARY: 191 mg/dL — AB (ref 65–99)
GLUCOSE-CAPILLARY: 172 mg/dL — AB (ref 65–99)
Glucose-Capillary: 160 mg/dL — ABNORMAL HIGH (ref 65–99)
Glucose-Capillary: 165 mg/dL — ABNORMAL HIGH (ref 65–99)

## 2016-03-21 LAB — TROPONIN I

## 2016-03-21 MED ORDER — STROKE: EARLY STAGES OF RECOVERY BOOK: Freq: Once | Status: DC

## 2016-03-21 MED ORDER — ASPIRIN 325 MG PO TABS
325.0000 mg | ORAL_TABLET | Freq: Every day | ORAL | Status: DC
  Administered 2016-03-22 – 2016-03-25 (×4): 325 mg via ORAL
  Filled 2016-03-21 (×5): qty 1

## 2016-03-21 MED ORDER — AMLODIPINE BESYLATE 5 MG PO TABS
5.0000 mg | ORAL_TABLET | Freq: Every day | ORAL | Status: DC
  Administered 2016-03-21 – 2016-03-25 (×5): 5 mg via ORAL
  Filled 2016-03-21 (×5): qty 1

## 2016-03-21 MED ORDER — CARVEDILOL 12.5 MG PO TABS: 12.5000 mg | ORAL_TABLET | Freq: Two times a day (BID) | ORAL | Status: DC

## 2016-03-21 MED ORDER — ASPIRIN 300 MG RE SUPP
300.0000 mg | Freq: Every day | RECTAL | Status: DC
  Filled 2016-03-21 (×5): qty 1

## 2016-03-21 MED ORDER — CITALOPRAM HYDROBROMIDE 20 MG PO TABS
10.0000 mg | ORAL_TABLET | Freq: Every day | ORAL | Status: DC
  Administered 2016-03-21 – 2016-03-25 (×5): 10 mg via ORAL
  Filled 2016-03-21 (×5): qty 1

## 2016-03-21 NOTE — Care Management Obs Status (Signed)
MEDICARE OBSERVATION STATUS NOTIFICATION   Patient Details  Name: Christian Erickson MRN: [id number] Date of Birth: August 24, 1930   Medicare Observation Status Notification Given:  Yes    McGibboneyFelicity Coyer, Ritha Sampedro, RN 03/21/2016, 11:06 AM

## 2016-03-21 NOTE — NC FL2 (Signed)
[place] MEDICAID FL2 LEVEL OF CARE SCREENING TOOL     IDENTIFICATION  Patient Name: Christian Erickson Birthdate: 04-25-1931 Sex: male Admission Date (Current Location): 03/20/2016  [facility] [place] [place]   Facility and Address:  [facility],  [address], [place] [id number]      Provider Number: [id number]  Attending Physician Name and Address:  Alison MurrayAlma M Devine, MD  Relative Name and Phone Number:       Current Level of Care: Hospital Recommended Level of Care: Assisted Living Facility Prior Approval Number:    Date Approved/Denied:   PASRR Number:    Discharge Plan:  (ALF, Memory Care )    Current Diagnoses: Patient Active Problem List   Diagnosis Date Noted  . Syncope 03/20/2016  . Bradycardia 03/20/2016  . PCP NOTES >>>> 05/21/2015  . Mixed Alzheimer's and vascular dementia 10/04/2013  . Stroke (HCC) 07/23/2013  . Acute CVA (cerebrovascular accident) (HCC) 07/23/2013  . Loss of weight 12/11/2012  . Memory difficulty 05/26/2012  . Hypothyroidism 01/20/2012  . Medicare annual wellness visit, subsequent 01/06/2012  . Edema 01/06/2012  . BPH (benign prostatic hyperplasia) 09/05/2011  . DJD (degenerative joint disease) 06/24/2011  . ALLERGIC RHINITIS 09/10/2010  . BACK PAIN 08/21/2009  . DMII (diabetes mellitus, type 2) (HCC) 05/25/2009  . THROMBOCYTOPENIA 08/30/2008  . GLAUCOMA 08/30/2008  . ERECTILE DYSFUNCTION 01/25/2007  . Dyslipidemia 12/15/2006  . Essential hypertension 12/15/2006  . DIVERTICULITIS, HX OF 12/15/2006    Orientation RESPIRATION BLADDER Height & Weight     Self  Normal External catheter, Incontinent Weight: 162 lb (73.5 kg) Height:  5\' 8"  (172.7 cm)  BEHAVIORAL SYMPTOMS/MOOD NEUROLOGICAL BOWEL NUTRITION STATUS   (none)  (none) Continent Diet (Heart Healthy )  AMBULATORY STATUS COMMUNICATION OF NEEDS Skin   Limited Assist Verbally Normal                       Personal Care Assistance Level of  Assistance  Bathing, Feeding, Dressing Bathing Assistance: Limited assistance Feeding assistance: Independent Dressing Assistance: Limited assistance     Functional Limitations Info  Speech, Hearing, Sight Sight Info: Adequate Hearing Info: Adequate Speech Info: Adequate    SPECIAL CARE FACTORS FREQUENCY  PT (By licensed PT)     PT Frequency: 3              Contractures      Additional Factors Info  Code Status, Allergies Code Status Info: DNR CODE  Allergies Info: Beef-derived Products, Pork-derived Products, Vasotec, Other           Current Medications (03/21/2016):  This is the current hospital active medication list Current Facility-Administered Medications  Medication Dose Route Frequency Provider Last Rate Last Dose  . acetaminophen (TYLENOL) tablet 1,000 mg  1,000 mg Oral Q6H PRN Ogle ArtJessica U Vann, DO   1,000 mg at 03/21/16 1104  . amLODipine (NORVASC) tablet 5 mg  5 mg Oral Daily Alison MurrayAlma M Devine, MD   5 mg at 03/21/16 1103  . atorvastatin (LIPITOR) tablet 40 mg  40 mg Oral q1800 Sarah ArtJessica U Vann, DO   40 mg at 03/20/16 1727  . citalopram (CELEXA) tablet 10 mg  10 mg Oral Daily Alison MurrayAlma M Devine, MD      . clopidogrel (PLAVIX) tablet 75 mg  75 mg Oral Daily Elih ArtJessica U Vann, DO   75 mg at 03/21/16 1103  . fluticasone (FLONASE) 50 MCG/ACT nasal spray 2 spray  2 spray Each Nare  Daily PRN Alton Art, DO      . insulin aspart (novoLOG) injection 0-5 Units  0-5 Units Subcutaneous QHS Jessica U Vann, DO      . insulin aspart (novoLOG) injection 0-9 Units  0-9 Units Subcutaneous TID WC Zair Art, DO   2 Units at 03/21/16 0825  . latanoprost (XALATAN) 0.005 % ophthalmic solution 1 drop  1 drop Both Eyes QHS Kentley Art, DO   1 drop at 03/20/16 2255  . levothyroxine (SYNTHROID, LEVOTHROID) tablet 25 mcg  25 mcg Oral QAC breakfast Lazer Art, DO   25 mcg at 03/21/16 0825  . multivitamin with minerals tablet 1 tablet  1 tablet Oral Daily Thunder Art, DO   1 tablet at  03/21/16 1104  . senna-docusate (Senokot-S) tablet 1 tablet  1 tablet Oral QHS PRN Diontae Art, DO      . sodium chloride flush (NS) 0.9 % injection 3 mL  3 mL Intravenous Q12H Bergen Art, DO   3 mL at 03/21/16 1104     Discharge Medications: Please see discharge summary for a list of discharge medications.  Relevant Imaging Results:  Relevant Lab Results:   Additional Information SSN [id number]  Derenda Fennel A

## 2016-03-21 NOTE — Evaluation (Signed)
Physical Therapy Evaluation Patient Details Name: Christian Erickson MRN: [id number] DOB: September 02, 1930 Today's Date: 03/21/2016   History of Present Illness  82 y.o. male with medical history significant of Alzheimer's dementia with occasional behavioral issues, DM, HTN and hypothyroidism and admitted for syncopal episodes.  Clinical Impression  Pt admitted with above diagnosis. Pt currently with functional limitations due to the deficits listed below (see PT Problem List).  Pt will benefit from skilled PT to increase their independence and safety with mobility to allow discharge to the venue listed below.  Pt assisted with mobility and orthostatics obtained and documented.   Pt's spouse reports pt typically ambulatory all around ALF and pleased to see pt able to ambulate today.     Follow Up Recommendations SNF;Supervision/Assistance - 24 hour (SNF if ALF cannot provide current assist)    Equipment Recommendations  Rolling walker with 5" wheels    Recommendations for Other Services       Precautions / Restrictions Precautions Precautions: Fall      Mobility  Bed Mobility Overal bed mobility: Needs Assistance Bed Mobility: Supine to Sit     Supine to sit: Min assist;HOB elevated     General bed mobility comments: assist for trunk upright  Transfers Overall transfer level: Needs assistance Equipment used: 1 person hand held assist Transfers: Sit to/from Stand Sit to Stand: Min assist;+2 safety/equipment         General transfer comment: pt required initial assist to rise and steady, obtained orthostatics (documentated in flowsheets), pt with BM so returned to sitting to prepare for pericare, pt able to stand again min/guard and maintaining standing for 3-4 minutes  Ambulation/Gait Ambulation/Gait assistance: Min guard;+2 safety/equipment Ambulation Distance (Feet): 120 Feet Assistive device: 2 person hand held assist Gait Pattern/deviations: Step-through pattern;Decreased  stride length     General Gait Details: provided 2 HHA for a little support and guiding pt for ambulation, pt not used to using any assistive device per spouse  Careers information officer    Modified Rankin (Stroke Patients Only)       Balance Overall balance assessment: History of Falls (spouse reports recent fall (hit his head))                                           Pertinent Vitals/Pain Pain Assessment: No/denies pain    Home Living Family/patient expects to be discharged to:: Assisted living                      Prior Function Level of Independence: Needs assistance   Gait / Transfers Assistance Needed: ambulates without assistive device. usually all over facility per spouse  ADL's / Homemaking Assistance Needed: some assist for bathing and dressing depending on cognition        Hand Dominance        Extremity/Trunk Assessment   Upper Extremity Assessment: LUE deficits/detail       LUE Deficits / Details: L arm numbness (per chart review) and swelling, pt able to perform AROM for functional tasks and grip using L UE   Lower Extremity Assessment: Generalized weakness         Communication   Communication: No difficulties  Cognition Arousal/Alertness: Awake/alert Behavior During Therapy: WFL for tasks assessed/performed Overall Cognitive Status: History of cognitive impairments - at baseline  General Comments      Exercises     Assessment/Plan    PT Assessment Patient needs continued PT services  PT Problem List Decreased strength;Decreased balance;Decreased cognition;Decreased knowledge of use of DME;Decreased safety awareness;Decreased mobility       PT Diagnosis   Other abnormalities of gait and mobility  Adverse reaction to beta-blocker, initial encounter  Dehydration  Syncope, unspecified syncope type  Left arm numbness - Plan: MR Brain Wo Contrast, MR  Brain Wo Contrast    PT Treatment Interventions DME instruction;Gait training;Functional mobility training;Balance training;Therapeutic exercise;Therapeutic activities;Patient/family education    PT Goals (Current goals can be found in the Care Plan section)  Acute Rehab PT Goals PT Goal Formulation: With patient/family Time For Goal Achievement: 03/28/16 Potential to Achieve Goals: Good    Frequency Min 3X/week   Barriers to discharge        Co-evaluation               End of Session Equipment Utilized During Treatment: Gait belt Activity Tolerance: Patient tolerated treatment well Patient left: in chair;with call bell/phone within reach;with family/visitor present;with chair alarm set      Functional Assessment Tool Used: clinical judgement Functional Limitation: Mobility: Walking and moving around Mobility: Walking and Moving Around Current Status [phone](G8978): At least 20 percent but less than 40 percent impaired, limited or restricted Mobility: Walking and Moving Around Goal Status [phone](G8979): At least 1 percent but less than 20 percent impaired, limited or restricted    Time: 1020-1050 PT Time Calculation (min) (ACUTE ONLY): 30 min   Charges:   PT Evaluation $PT Eval Moderate Complexity: 1 Procedure     PT G Codes:   PT G-Codes **NOT FOR INPATIENT CLASS** Functional Assessment Tool Used: clinical judgement Functional Limitation: Mobility: Walking and moving around Mobility: Walking and Moving Around Current Status ([id number](G8978): At least 20 percent but less than 40 percent impaired, limited or restricted Mobility: Walking and Moving Around Goal Status [phone](G8979): At least 1 percent but less than 20 percent impaired, limited or restricted    [name],KATHrine E 03/21/2016, 12:53 PM [name], PT, DPT 03/21/2016 Pager: [phone]

## 2016-03-21 NOTE — Clinical Social Work Note (Signed)
[facility] where he is a [facility] resident.   MSW completed FL-2 and [facility] for [facility]. Melissa reviewed clinicals and determined that patient IS APPROPRIATE to return to ALF.   MSW attempted to contact pt's wife on cell phone (unable to leave message) and home phone (left detailed message).   Patient is under OBS and if family desires SNF, will be required to pay privately under Medicare regulations.   PLAN: patient to return to ALF at dc.   WEEKEND CSW: Pls contact facility representative, Melissa at [phone] if patient discharging over the weekend. Facility will be prepared to admit patient.   No further concerns reported by facility at this time.   MSW remains available as needed.   Derenda FennelBashira Alailah Safley, MSW [phone] 03/21/2016 4:05 PM

## 2016-03-21 NOTE — Progress Notes (Signed)
  Echocardiogram 2D Echocardiogram has been performed.  Janalyn HarderWest, Thai Hemrick R 03/21/2016, 1:32 PM

## 2016-03-21 NOTE — Consult Note (Signed)
Neurology Consultation Reason for Consult: Stroke Referring Physician: Manson Passey  CC: Left-sided weakness  History is obtained from: Wife  HPI: Christian Erickson is a 82 y.o. male with a history of relatively advanced dementia who presents with syncopal episode. Apparently, he was found to have bradycardia and his blood pressure became unreadable. During this timeframe, he became unresponsive. Subsequently, he regained responsiveness after EMS arrival. He apparently has been complaining of left-sided weakness for a few days.     LKW: unclear tpa given?: No, unclear time of onset NIH SS: 3 (orientation and left arm drift)   ROS: A 14 point ROS was performed and is negative except as noted in the HPI.   Past Medical History:  Diagnosis Date  . Allergic rhinitis   . Alzheimer's dementia   . Arthritis   . Blood dyscrasia   . BPH (benign prostatic hypertrophy)    nocturia  . Diabetes mellitus   . Diverticulitis   . ED (erectile dysfunction)   . Glaucoma    sees eye doctor routinely  . Hyperlipidemia   . Hypertension   . Subclinical hypothyroidism      Family History  Problem Relation Age of Onset  . Lupus Sister   . Stroke Brother 97    CVA, no FH premature CAD  . Diabetes Other     granddaughter  . Prostate cancer Other     cousin, age 60  . CAD Mother     "enlarged heart"  . Colon cancer Neg Hx      Social History:  reports that he has quit smoking. His smoking use included Cigarettes. He has never used smokeless tobacco. He reports that he drinks alcohol. He reports that he does not use drugs.   Exam: Current vital signs: BP (!) 175/65 (BP Location: Left Arm)   Pulse 75   Temp 98.3 F (36.8 C) (Oral)   Resp 18   Ht 5\' 8"  (1.727 m)   Wt 73.5 kg (162 lb)   SpO2 100%   BMI 24.63 kg/m  Vital signs in last 24 hours: Temp:  [98.1 F (36.7 C)-98.3 F (36.8 C)] 98.3 F (36.8 C) (09/15 0557) Pulse Rate:  [75-76] 75 (09/15 0557) Resp:  [18] 18 (09/15  0557) BP: (159-175)/(65-72) 175/65 (09/15 0557) SpO2:  [100 %] 100 % (09/15 0557)   Physical Exam  Constitutional: Appears well-developed and well-nourished.  Psych: Affect appropriate to situation Eyes: No scleral injection HENT: No OP obstrucion Head: Normocephalic.  Cardiovascular: Normal rate and regular rhythm.  Respiratory: Effort normal and breath sounds normal to anterior ascultation GI: Soft.  No distension. There is no tenderness.  Skin: WDI  Neuro: Mental Status: Patient is awake, alert, oriented to person only  Patient is able to give a clear and coherent history. No signs of aphasia or neglect Cranial Nerves: II: Visual Fields are full. Pupils are equal, round, and reactive to light.   III,IV, VI: EOMI without ptosis or diploplia.  V: Facial sensation is symmetric to temperature VII: Facial movement is symmetric.  VIII: hearing is intact to voice X: Uvula elevates symmetrically XI: Shoulder shrug is symmetric. XII: tongue is midline without atrophy or fasciculations.  Motor: Tone is normal. Bulk is normal. 5/5 strength was present on the right side, he has a very mild left arm weakness, good strength in the left leg Sensory: Sensation is symmetric to light touch and temperature in the arms and legs. 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 Discussed limiting his workup with his wife, but she would favor continuing to do full workups at this time. She also has concerns about his progression of his memory and in the setting of may be reasonable to check a B12, but I think that this just represents progression of his underlying Alzheimer's  disease.  Recommendations: 1) lipid panel, increase statin for LDL less than 70 2) carotid Dopplers 3) continue Plavix 4) B12 5) speech cognitive/swallow  evaluation 6) follow-up as an outpatient with their neurologist.   Christian SlotMcNeill Asser Lucena, MD [facility] [phone]  If 7pm- 7am, please page neurology on call as listed in AMION.

## 2016-03-21 NOTE — Progress Notes (Signed)
RN attempted 2 x PIV, was unsuccessful. IV team consult was ordered.

## 2016-03-21 NOTE — Progress Notes (Signed)
**  Preliminary report by tech**  Left upper extremity venous duplex completed. There is no evidence of deep or superficial vein thrombosis involving the left upper extremity. All visualized vessels appear patent and compressible.   03/21/16 4:21 PM Olen CordialGreg Isidore Margraf RVT

## 2016-03-21 NOTE — Progress Notes (Addendum)
Patient ID: Christian Erickson Beachem, male   DOB: Dec 23, 1930, 82 y.o.   MRN: [id number]  PROGRESS NOTE    Christian Erickson Woloszyn  [id number] DOB: Dec 23, 1930 DOA: 03/20/2016  PCP: Pcp Not In System   Brief Narrative:  82 y.o. male with past medical history significant for dementia with occasional behavioral issues, DM, HTN and hypothyroidism.   patient presented to ER one day prior to this admission with left leg numbness, unresponsive episode at lunch time and drowsiness. His workup was nonrevealing so patient was subsequently discharged. He now comes back because of another episode of unresponsiveness at breakfast. No seizure-like activity.  In ER, patient was hemodynamically stable. He had slight bradycardia. He was admitted for observation.  Assessment & Plan:   Active Problems: Bradycardia / unresponsiveness - Likely secondary to Coreg which we stopped - Continue to monitor heart rate while off of beta blockers - Obtain 2-D echo - We'll discontinue Seroquel today and will start low-dose Celexa  Left arm numbness - Patient has had stroke about 2 years ago - His family reports that left arm numbness is a new problem which initially brought him to hospital ED one day prior to this admission - I think it's reasonable to obtain MRI of the brain to evaluate for possible stroke  Left arm swelling - Unclear etiology and seems to be better now however this is also one of those problems that brought patient to ED one day prior to this admission - Obtain upper extremity Doppler  Essential hypertension - Start Norvasc  DVT prophylaxis: SCDs bilaterally Code Status: DNR/DNI Family Communication: Wife at the bedside Disposition Plan: Needs physical therapy evaluation for safe discharge planning   Consultants:   PT  Procedures:   Lower extremity Doppler - no evidence of DVT  Antimicrobials:   None    Subjective: No overnight events.   Objective: Vitals:   03/20/16 1330 03/20/16 1544  03/20/16 1935 03/21/16 0557  BP: 130/63 (!) 177/78 (!) 159/72 (!) 175/65  Pulse: (!) 50 (!) 51 76 75  Resp: 13 16 18 18   Temp:  98.8 F (37.1 C) 98.1 F (36.7 C) 98.3 F (36.8 C)  TempSrc:  Oral Oral Oral  SpO2: 99% 100% 100% 100%  Weight:      Height:        Intake/Output Summary (Last 24 hours) at 03/21/16 1143 Last data filed at 03/21/16 0500  Gross per 24 hour  Intake                0 ml  Output             1001 ml  Net            -1001 ml   Filed Weights   03/20/16 0956  Weight: 73.5 kg (162 lb)    Examination:  General exam: Appears calm and comfortable  Respiratory system: Clear to auscultation. Respiratory effort normal. Cardiovascular system: S1 & S2 heard, RRR. No pedal edema. Gastrointestinal system: Abdomen is nondistended, soft and nontender. No organomegaly or masses felt. Normal bowel sounds heard. Central nervous system: Alert and oriented. No focal neurological deficits. Extremities: Symmetric 5 x 5 power. Skin: No rashes, lesions or ulcers Psychiatry: Judgement and insight appear normal. Mood & affect appropriate.   Data Reviewed: I have personally reviewed following labs and imaging studies  CBC:  Recent Labs Lab 03/19/16 1445 03/20/16 1024 03/21/16 0320  WBC 5.8 5.9 6.3  NEUTROABS 3.6 4.3  --   HGB 13.3  12.3* 13.1  HCT 39.2 36.2* 39.2  MCV 87.5 87.0 86.5  PLT 129* 121* 120*   Basic Metabolic Panel:  Recent Labs Lab 03/19/16 1445 03/20/16 1024 03/21/16 0320  NA 137 137 136  K 4.0 3.8 3.8  CL 101 101 101  CO2 29 30 29   GLUCOSE 62* 220* 193*  BUN 27* 25* 24*  CREATININE 1.35* 1.37* 1.29*  CALCIUM 9.0 8.6* 8.9   GFR: Estimated Creatinine Clearance: 42 mL/min (by C-G formula based on SCr of 1.29 mg/dL (H)). Liver Function Tests:  Recent Labs Lab 03/19/16 1445  AST 30  ALT 41  ALKPHOS 84  BILITOT 0.5  PROT 7.3  ALBUMIN 3.1*   No results for input(s): LIPASE, AMYLASE in the last 168 hours. No results for input(s):  AMMONIA in the last 168 hours. Coagulation Profile: No results for input(s): INR, PROTIME in the last 168 hours. Cardiac Enzymes:  Recent Labs Lab 03/20/16 1647 03/20/16 2243 03/21/16 0320  TROPONINI <0.03 <0.03 <0.03   BNP (last 3 results) No results for input(s): PROBNP in the last 8760 hours. HbA1C: No results for input(s): HGBA1C in the last 72 hours. CBG:  Recent Labs Lab 03/20/16 1650 03/20/16 2138 03/21/16 0803  GLUCAP 128* 125* 160*   Lipid Profile: No results for input(s): CHOL, HDL, LDLCALC, TRIG, CHOLHDL, LDLDIRECT in the last 72 hours. Thyroid Function Tests:  Recent Labs  03/19/16 1550  TSH 2.310   Anemia Panel: No results for input(s): VITAMINB12, FOLATE, FERRITIN, TIBC, IRON, RETICCTPCT in the last 72 hours. Urine analysis:    Component Value Date/Time   COLORURINE YELLOW 03/19/2016 1433   APPEARANCEUR CLEAR 03/19/2016 1433   LABSPEC 1.007 03/19/2016 1433   PHURINE 6.5 03/19/2016 1433   GLUCOSEU NEGATIVE 03/19/2016 1433   GLUCOSEU NEGATIVE 01/21/2013 1040   HGBUR NEGATIVE 03/19/2016 1433   HGBUR small 05/11/2008 1059   BILIRUBINUR NEGATIVE 03/19/2016 1433   BILIRUBINUR neg 09/05/2011 1300   KETONESUR NEGATIVE 03/19/2016 1433   PROTEINUR NEGATIVE 03/19/2016 1433   UROBILINOGEN 0.2 03/14/2014 0838   NITRITE NEGATIVE 03/19/2016 1433   LEUKOCYTESUR NEGATIVE 03/19/2016 1433   Sepsis Labs: @LABRCNTIP (procalcitonin:4,lacticidven:4)   )No results found for this or any previous visit (from the past 240 hour(s)).    Radiology Studies: Dg Chest 2 View Result Date: 03/19/2016 No active cardiopulmonary disease. Electronically Signed   By: Elige Ko   On: 03/19/2016 16:34   Ct Head Wo Contrast Result Date: 03/19/2016 Stable non contrast CT appearance of the brain. No acute intracranial abnormality. Electronically Signed   By: Odessa Fleming M.D.   On: 03/19/2016 15:11   Dg Hand Complete Left Result Date: 03/19/2016 No acute abnormality. Moderate  first CMC osteoarthritis. Electronically Signed   By: Drusilla Kanner M.D.   On: 03/19/2016 16:33   Dg Hip Unilat W Or Wo Pelvis 2-3 Views Left Result Date: 03/19/2016 Negative. Electronically Signed   By: Kennith Center M.D.   On: 03/19/2016 16:30      Scheduled Meds: . amLODipine  5 mg Oral Daily  . atorvastatin  40 mg Oral q1800  . clopidogrel  75 mg Oral Daily  . insulin aspart  0-5 Units Subcutaneous QHS  . insulin aspart  0-9 Units Subcutaneous TID WC  . latanoprost  1 drop Both Eyes QHS  . levothyroxine  25 mcg Oral QAC breakfast  . multivitamin with minerals  1 tablet Oral Daily  . QUEtiapine  25 mg Oral QHS  . sodium chloride flush  3  mL Intravenous Q12H   Continuous Infusions:    LOS: 0 days    Time spent: 25 minutes  Greater than 50% of the time spent on counseling and coordinating the care.   Manson Passey, MD [facility] Pager [phone]  If 7PM-7AM, please contact night-coverage [email or web address] Password [facility] 03/21/2016, 11:43 AM

## 2016-03-22 ENCOUNTER — Inpatient Hospital Stay (HOSPITAL_COMMUNITY): Payer: Medicare Other

## 2016-03-22 DIAGNOSIS — Z87891 Personal history of nicotine dependence: Secondary | ICD-10-CM | POA: Diagnosis not present

## 2016-03-22 DIAGNOSIS — E039 Hypothyroidism, unspecified: Secondary | ICD-10-CM | POA: Diagnosis not present

## 2016-03-22 DIAGNOSIS — Z823 Family history of stroke: Secondary | ICD-10-CM | POA: Diagnosis not present

## 2016-03-22 DIAGNOSIS — G309 Alzheimer's disease, unspecified: Secondary | ICD-10-CM | POA: Diagnosis not present

## 2016-03-22 DIAGNOSIS — I10 Essential (primary) hypertension: Secondary | ICD-10-CM | POA: Diagnosis not present

## 2016-03-22 DIAGNOSIS — I639 Cerebral infarction, unspecified: Secondary | ICD-10-CM | POA: Diagnosis present

## 2016-03-22 DIAGNOSIS — N400 Enlarged prostate without lower urinary tract symptoms: Secondary | ICD-10-CM | POA: Diagnosis present

## 2016-03-22 DIAGNOSIS — Z23 Encounter for immunization: Secondary | ICD-10-CM | POA: Diagnosis not present

## 2016-03-22 DIAGNOSIS — F0390 Unspecified dementia without behavioral disturbance: Secondary | ICD-10-CM | POA: Diagnosis not present

## 2016-03-22 DIAGNOSIS — T447X5A Adverse effect of beta-adrenoreceptor antagonists, initial encounter: Secondary | ICD-10-CM | POA: Diagnosis present

## 2016-03-22 DIAGNOSIS — Z8673 Personal history of transient ischemic attack (TIA), and cerebral infarction without residual deficits: Secondary | ICD-10-CM | POA: Diagnosis not present

## 2016-03-22 DIAGNOSIS — M7989 Other specified soft tissue disorders: Secondary | ICD-10-CM | POA: Diagnosis present

## 2016-03-22 DIAGNOSIS — Z751 Person awaiting admission to adequate facility elsewhere: Secondary | ICD-10-CM | POA: Diagnosis present

## 2016-03-22 DIAGNOSIS — E860 Dehydration: Secondary | ICD-10-CM | POA: Diagnosis present

## 2016-03-22 DIAGNOSIS — R600 Localized edema: Secondary | ICD-10-CM | POA: Diagnosis present

## 2016-03-22 DIAGNOSIS — F0280 Dementia in other diseases classified elsewhere without behavioral disturbance: Secondary | ICD-10-CM | POA: Diagnosis present

## 2016-03-22 DIAGNOSIS — E119 Type 2 diabetes mellitus without complications: Secondary | ICD-10-CM | POA: Diagnosis not present

## 2016-03-22 DIAGNOSIS — Z7982 Long term (current) use of aspirin: Secondary | ICD-10-CM | POA: Diagnosis not present

## 2016-03-22 DIAGNOSIS — N289 Disorder of kidney and ureter, unspecified: Secondary | ICD-10-CM | POA: Diagnosis present

## 2016-03-22 DIAGNOSIS — Z79899 Other long term (current) drug therapy: Secondary | ICD-10-CM | POA: Diagnosis not present

## 2016-03-22 DIAGNOSIS — Z96651 Presence of right artificial knee joint: Secondary | ICD-10-CM | POA: Diagnosis present

## 2016-03-22 DIAGNOSIS — R001 Bradycardia, unspecified: Secondary | ICD-10-CM | POA: Diagnosis not present

## 2016-03-22 DIAGNOSIS — R55 Syncope and collapse: Secondary | ICD-10-CM | POA: Diagnosis present

## 2016-03-22 DIAGNOSIS — Z833 Family history of diabetes mellitus: Secondary | ICD-10-CM | POA: Diagnosis not present

## 2016-03-22 DIAGNOSIS — F0150 Vascular dementia without behavioral disturbance: Secondary | ICD-10-CM | POA: Diagnosis present

## 2016-03-22 DIAGNOSIS — Z8042 Family history of malignant neoplasm of prostate: Secondary | ICD-10-CM | POA: Diagnosis not present

## 2016-03-22 DIAGNOSIS — E038 Other specified hypothyroidism: Secondary | ICD-10-CM | POA: Diagnosis not present

## 2016-03-22 DIAGNOSIS — R208 Other disturbances of skin sensation: Secondary | ICD-10-CM | POA: Diagnosis not present

## 2016-03-22 DIAGNOSIS — Z66 Do not resuscitate: Secondary | ICD-10-CM | POA: Diagnosis present

## 2016-03-22 DIAGNOSIS — Z7984 Long term (current) use of oral hypoglycemic drugs: Secondary | ICD-10-CM | POA: Diagnosis not present

## 2016-03-22 DIAGNOSIS — Z8249 Family history of ischemic heart disease and other diseases of the circulatory system: Secondary | ICD-10-CM | POA: Diagnosis not present

## 2016-03-22 LAB — VITAMIN B12: VITAMIN B 12: 429 pg/mL (ref 180–914)

## 2016-03-22 LAB — LIPID PANEL
Cholesterol: 110 mg/dL (ref 0–200)
HDL: 27 mg/dL — AB (ref >40)
LDL Cholesterol: 70 mg/dL (ref 0–99)
TRIGLYCERIDES: 65 mg/dL (ref <150)
Total CHOL/HDL Ratio: 4.1 RATIO
VLDL: 13 mg/dL (ref 0–40)

## 2016-03-22 LAB — GLUCOSE, CAPILLARY
GLUCOSE-CAPILLARY: 234 mg/dL — AB (ref 65–99)
GLUCOSE-CAPILLARY: 113 mg/dL — AB (ref 65–99)
Glucose-Capillary: 196 mg/dL — ABNORMAL HIGH (ref 65–99)
Glucose-Capillary: 248 mg/dL — ABNORMAL HIGH (ref 65–99)

## 2016-03-22 NOTE — Progress Notes (Signed)
VASCULAR LAB PRELIMINARY  PRELIMINARY  PRELIMINARY  PRELIMINARY  Carotid duplex completed.    Preliminary report:  1-39% right ICA stenosis.  Unable to adequately visualize left ICA, tortuosity versus occlusion.    Khalaya Mcgurn, RVT 03/22/2016, 1:49 PM

## 2016-03-22 NOTE — Evaluation (Signed)
Clinical/Bedside Swallow Evaluation Patient Details  Name: Christian Erickson MRN: [id number] Date of Birth: 08/02/1930  Today's Date: 03/22/2016 Time: SLP Start Time (ACUTE ONLY): 1531 SLP Stop Time (ACUTE ONLY): 1600 SLP Time Calculation (min) (ACUTE ONLY): 29 min  Past Medical History:  Past Medical History:  Diagnosis Date  . Allergic rhinitis   . Alzheimer's dementia   . Arthritis   . Blood dyscrasia   . BPH (benign prostatic hypertrophy)    nocturia  . Diabetes mellitus   . Diverticulitis   . ED (erectile dysfunction)   . Glaucoma    sees eye doctor routinely  . Hyperlipidemia   . Hypertension   . Subclinical hypothyroidism    Past Surgical History:  Past Surgical History:  Procedure Laterality Date  . COLECTOMY     w/ reversal due to divertiuclitis per patient in 1990s aprox  . INGUINAL HERNIA REPAIR    . sbo repair    . TOTAL KNEE ARTHROPLASTY Right 10/27/2012   Procedure: TOTAL KNEE ARTHROPLASTY;  Surgeon: Loreta Ave, MD;  Location: [facility] OR;  Service: Orthopedics;  Laterality: Right;   HPI:  82 y.o. male with medical history significant of dementia with occasional behavioral issues, DM, HTN and hypothyroidism.  Patient was in the ER yesterday with left leg numbness, unresponsive episode at lunch, and drowsiness. Work up was unrevealing and patient was d./c back to facility.  Today he was in his normal state of health when he had an episode of unresponsiveness at breakfast.  No seizure like activity and EMS was able to wake him up.  Per facility they believe that seroquel is a new medications and has been adjusted in last few months; family noted no difficulty with swallowing at current time.   Assessment / Plan / Recommendation Clinical Impression  Pt appears to exhibit a normal oropharyngeal swallow despite baseline cognitive deficits; no s/s of aspriation noted during BSE    Aspiration Risk  No limitations    Diet Recommendation   Regular/thin  Medication  Administration: Whole meds with liquid    Other  Recommendations Oral Care Recommendations: Oral care BID   Follow up Recommendations None;Other (comment) (for swallowing)      Frequency and Duration           Prognosis Prognosis for Safe Diet Advancement: Good Barriers to Reach Goals: Cognitive deficits      Swallow Study   General Date of Onset: 03/21/16 HPI: 82 y.o. male with medical history significant of dementia with occasional behavioral issues, DM, HTN and hypothyroidism.  Patient was in the ER yesterday with left leg numbness, unresponsive episode at lunch, and drowsiness. Work up was unrevealing and patient was d./c back to facility.  Today he was in his normal state of health when he had an episode of unresponsiveness at breakfast.  No seizure like activity and EMS was able to wake him up.  Per facility they believe that seroquel is a new medications and has been adjusted in last few months.   Type of Study: Bedside Swallow Evaluation Previous Swallow Assessment: n/a Diet Prior to this Study: Regular;Thin liquids Temperature Spikes Noted: No Respiratory Status: Room air History of Recent Intubation: No Behavior/Cognition: Alert;Cooperative;Confused Oral Cavity Assessment: Within Functional Limits Oral Care Completed by SLP: No Oral Cavity - Dentition: Adequate natural dentition Vision: Functional for self-feeding Self-Feeding Abilities: Able to feed self;Needs assist Patient Positioning: Upright in chair Baseline Vocal Quality: Normal Volitional Cough: Strong Volitional Swallow: Unable to elicit  Oral/Motor/Sensory Function Overall Oral Motor/Sensory Function: Within functional limits   Ice Chips Ice chips: Not tested   Thin Liquid Thin Liquid: Within functional limits Presentation: Cup    Nectar Thick Nectar Thick Liquid: Not tested   Honey Thick Honey Thick Liquid: Not tested   Puree Puree: Within functional limits Presentation: Spoon   Solid       Solid: Within functional limits Presentation: Self Fed    Functional Assessment Tool Used: NOMS Functional Limitations: Swallowing Swallow Current Status (Z6109(G8996): 0 percent impaired, limited or restricted Swallow Goal Status (U0454(G8997): 0 percent impaired, limited or restricted Spoken Language Expression Current Status (U9811(G9162): At least 60 percent but less than 80 percent impaired, limited or restricted Spoken Language Expression Goal Status [phone](G9163): At least 40 percent but less than 60 percent impaired, limited or restricted   Ilsa Bonello,PAT, M.S., CCC-SLP 03/22/2016,5:15 PM

## 2016-03-22 NOTE — Evaluation (Signed)
Speech Language Pathology Evaluation Patient Details Name: Christian Erickson MRN: [id number] DOB: 1931/02/04 Today's Date: 03/22/2016 Time: 1531-1600 SLP Time Calculation (min) (ACUTE ONLY): 29 min  Problem List:  Patient Active Problem List   Diagnosis Date Noted  . CVA (cerebral infarction) 03/22/2016  . Syncope 03/20/2016  . Bradycardia 03/20/2016  . PCP NOTES >>>> 05/21/2015  . Mixed Alzheimer's and vascular dementia 10/04/2013  . Stroke (HCC) 07/23/2013  . Acute CVA (cerebrovascular accident) (HCC) 07/23/2013  . Loss of weight 12/11/2012  . Memory difficulty 05/26/2012  . Hypothyroidism 01/20/2012  . Medicare annual wellness visit, subsequent 01/06/2012  . Edema 01/06/2012  . BPH (benign prostatic hyperplasia) 09/05/2011  . DJD (degenerative joint disease) 06/24/2011  . ALLERGIC RHINITIS 09/10/2010  . BACK PAIN 08/21/2009  . DMII (diabetes mellitus, type 2) (HCC) 05/25/2009  . THROMBOCYTOPENIA 08/30/2008  . GLAUCOMA 08/30/2008  . ERECTILE DYSFUNCTION 01/25/2007  . Dyslipidemia 12/15/2006  . Essential hypertension 12/15/2006  . DIVERTICULITIS, HX OF 12/15/2006   Past Medical History:  Past Medical History:  Diagnosis Date  . Allergic rhinitis   . Alzheimer's dementia   . Arthritis   . Blood dyscrasia   . BPH (benign prostatic hypertrophy)    nocturia  . Diabetes mellitus   . Diverticulitis   . ED (erectile dysfunction)   . Glaucoma    sees eye doctor routinely  . Hyperlipidemia   . Hypertension   . Subclinical hypothyroidism    Past Surgical History:  Past Surgical History:  Procedure Laterality Date  . COLECTOMY     w/ reversal due to divertiuclitis per patient in 1990s aprox  . INGUINAL HERNIA REPAIR    . sbo repair    . TOTAL KNEE ARTHROPLASTY Right 10/27/2012   Procedure: TOTAL KNEE ARTHROPLASTY;  Surgeon: Loreta Aveaniel F Murphy, MD;  [facility];  Service: Orthopedics;  Laterality: Right;   HPI:  82 y.o. male with medical history significant of dementia  with occasional behavioral issues, DM, HTN and hypothyroidism.  Patient was in the ER yesterday with left leg numbness, unresponsive episode at lunch, and drowsiness. Work up was unrevealing and patient was d./c back to facility.  Today he was in his normal state of health when he had an episode of unresponsiveness at breakfast.  No seizure like activity and EMS was able to wake him up.  Per facility they believe that seroquel is a new medications and has been adjusted in last few months.     Assessment / Plan / Recommendation Clinical Impression  Pt has baseline deficits with moderate dementia, but family stated he is "much different" than he was a month ago within the area of verbal expression; he uses more neologisms, confabulations and perseverations during conversational tasks,.  He has several graduate degrees and an extensive vocabulary, but even with that, his deficits are evident within a simple conversation.  Pt is oriented to self only and place with cues given, but disoriented to situation and time.  Pt was previously in a memory unit at an assisted living facility prior to admission with a new CVA which has likely exacerbated his previous cognitive deficits.  ST to follow in house to assist with sustaining current abilities and utilizing compensatory strategies with pt/family through education/aphasia tx.    SLP Assessment  Patient needs continued Speech Language Pathology Services    Follow Up Recommendations  Home health SLP;Skilled Nursing facility;Other (comment) (per family request)    Frequency and Duration min 2x/week  1 week  SLP Evaluation Cognition  Overall Cognitive Status: History of cognitive impairments - at baseline Arousal/Alertness: Awake/alert Orientation Level: Oriented to person;Disoriented to time;Disoriented to place;Disoriented to situation Memory: Impaired Memory Impairment: Decreased short term memory;Decreased recall of new information;Retrieval  deficit Decreased Short Term Memory: Verbal basic;Functional basic Problem Solving: Impaired Problem Solving Impairment: Verbal basic;Functional basic Behaviors: Perseveration;Confabulation Safety/Judgment: Impaired       Comprehension  Auditory Comprehension Overall Auditory Comprehension: Impaired at baseline Conversation: Other (comment) Other Conversation Comments: Pt exhibits word salad; extensive vocabulary, Visual Recognition/Discrimination Discrimination: Not tested Reading Comprehension Reading Status: Not tested    Expression Expression Primary Mode of Expression: Verbal Verbal Expression Overall Verbal Expression: Impaired at baseline (Family stated he is "much different" than a month ago) Level of Generative/Spontaneous Verbalization: Conversation Repetition: Impaired Level of Impairment: Phrase level Naming: Impairment Responsive: 26-50% accurate Confrontation: Within functional limits Convergent: 0-24% accurate Divergent: 0-24% accurate Other Naming Comments: Confabulation;neologisms; perseverations Verbal Errors: Semantic paraphasias;Neologisms;Confabulation;Perseveration;Language of confusion Pragmatics: Unable to assess Interfering Components: Premorbid deficit Non-Verbal Means of Communication: Not applicable Written Expression Written Expression: Not tested   Oral / Motor  Oral Motor/Sensory Function Overall Oral Motor/Sensory Function: Within functional limits Motor Speech Overall Motor Speech: Appears within functional limits for tasks assessed Respiration: Within functional limits Phonation: Normal Resonance: Within functional limits Articulation: Within functional limitis Intelligibility: Intelligible Motor Planning: Witnin functional limits Motor Speech Errors: Not applicable Interfering Components: Premorbid status             Functional Limitations: Spoken language expressive Spoken Language Expression Current Status [phone]): At least 60  percent but less than 80 percent impaired, limited or restricted Spoken Language Expression Goal Status [phone]): At least 40 percent but less than 60 percent impaired, limited or restricted         ADAMS,PAT, M.S., CCC-SLP 03/22/2016, 5:09 PM

## 2016-03-22 NOTE — Progress Notes (Signed)
Patient ID: Christian Erickson, male   DOB: 29-Jan-1931, 82 y.o.   MRN: [id number]  PROGRESS NOTE    Christian Erickson  WUJ:[id number] DOB: 10-30-1930 DOA: 03/20/2016  PCP: Pcp Not In System   Brief Narrative:  82 y.o. male with past medical history significant for dementia with occasional behavioral issues, DM, HTN and hypothyroidism.   patient presented to ER one day prior to this admission with left leg numbness, unresponsive episode at lunch time and drowsiness. His workup was nonrevealing so patient was subsequently discharged. He now comes back because of another episode of unresponsiveness at breakfast. No seizure-like activity.  In ER, patient was hemodynamically stable. He had slight bradycardia. He was admitted for observation.  Assessment & Plan:   Active Problems: Bradycardia / unresponsiveness - Likely secondary to Coreg which we stopped - HR 59 this am - 2 D ECHO with normal EF  Left arm numbness / Acute CVA in right centrum semiovale - Stroke work up initiated:  - Aspirin daily - MRI brain / MRA brain - acute CVA in right centrum semiovale - 2D ECHO - EF 55-60%  - Carotid doppler - pending  - HgbA1c pending - Lipid panel - 70 LDL goal < 100. - Diet:regular  - Therapy: PT/OT - SNF  Other Stroke Risk Factors : Advanced age, history of CVA, hypertension  - Appreciate neurology recommendations   Left arm swelling - Unclear etiology and seems to be better now however this is also one of those problems that brought patient to ED one day prior to this admission - LE doppler negative for DVT  Essential hypertension - Started Norvasc - BP 158/76  Depression - Started low dose Celexa and stopped seroquel   DVT prophylaxis: SCD's bilaterally Code Status: DNR/DNI Family Communication: daughter at the bedside Disposition Plan: to SNF once bed available    Consultants:   PT  Procedures:   Lower extremity Doppler - no evidence of DVT  LUE doppler - No DVT  2 D ECHO  - EF 55-60%, garde 1 DD  Antimicrobials:   None    Subjective: No overnight events.   Objective: Vitals:   03/20/16 1935 03/21/16 0557 03/21/16 2133 03/22/16 0618  BP: (!) 159/72 (!) 175/65 (!) 147/81 (!) 158/76  Pulse: 76 75 61 (!) 59  Resp: 18 18 18 16   Temp: 98.1 F (36.7 C) 98.3 F (36.8 C) 98.9 F (37.2 C) 99.3 F (37.4 C)  TempSrc: Oral Oral Oral Oral  SpO2: 100% 100% 99% 100%  Weight:      Height:       No intake or output data in the 24 hours ending 03/22/16 0940 Filed Weights   03/20/16 0956  Weight: 73.5 kg (162 lb)    Examination:  General exam: Appears calm and comfortable, no distress  Respiratory system: No wheezing, no rhonchi  Cardiovascular system: S1 & S2 heard, Rate controlled  Gastrointestinal system: (+) SB, non tender  Central nervous system: No focal neurological deficits. Extremities: No edema, palpable pulses  Skin: No rashes, lesions or ulcers Psychiatry: Normal mood    Data Reviewed: I have personally reviewed following labs and imaging studies  CBC:  Recent Labs Lab 03/19/16 1445 03/20/16 1024 03/21/16 0320  WBC 5.8 5.9 6.3  NEUTROABS 3.6 4.3  --   HGB 13.3 12.3* 13.1  HCT 39.2 36.2* 39.2  MCV 87.5 87.0 86.5  PLT 129* 121* 120*   Basic Metabolic Panel:  Recent Labs Lab 03/19/16 1445 03/20/16 1024  03/21/16 0320  NA 137 137 136  K 4.0 3.8 3.8  CL 101 101 101  CO2 29 30 29   GLUCOSE 62* 220* 193*  BUN 27* 25* 24*  CREATININE 1.35* 1.37* 1.29*  CALCIUM 9.0 8.6* 8.9   GFR: Estimated Creatinine Clearance: 42 mL/min (by C-G formula based on SCr of 1.29 mg/dL (H)). Liver Function Tests:  Recent Labs Lab 03/19/16 1445  AST 30  ALT 41  ALKPHOS 84  BILITOT 0.5  PROT 7.3  ALBUMIN 3.1*   No results for input(s): LIPASE, AMYLASE in the last 168 hours. No results for input(s): AMMONIA in the last 168 hours. Coagulation Profile: No results for input(s): INR, PROTIME in the last 168 hours. Cardiac  Enzymes:  Recent Labs Lab 03/20/16 1647 03/20/16 2243 03/21/16 0320  TROPONINI <0.03 <0.03 <0.03   BNP (last 3 results) No results for input(s): PROBNP in the last 8760 hours. HbA1C: No results for input(s): HGBA1C in the last 72 hours. CBG:  Recent Labs Lab 03/21/16 0803 03/21/16 1224 03/21/16 1706 03/21/16 2136 03/22/16 0737  GLUCAP 160* 191* 165* 172* 196*   Lipid Profile:  Recent Labs  03/22/16 0538  CHOL 110  HDL 27*  LDLCALC 70  TRIG 65  CHOLHDL 4.1   Thyroid Function Tests:  Recent Labs  03/19/16 1550  TSH 2.310   Anemia Panel: No results for input(s): VITAMINB12, FOLATE, FERRITIN, TIBC, IRON, RETICCTPCT in the last 72 hours. Urine analysis:    Component Value Date/Time   COLORURINE YELLOW 03/19/2016 1433   APPEARANCEUR CLEAR 03/19/2016 1433   LABSPEC 1.007 03/19/2016 1433   PHURINE 6.5 03/19/2016 1433   GLUCOSEU NEGATIVE 03/19/2016 1433   GLUCOSEU NEGATIVE 01/21/2013 1040   HGBUR NEGATIVE 03/19/2016 1433   HGBUR small 05/11/2008 1059   BILIRUBINUR NEGATIVE 03/19/2016 1433   BILIRUBINUR neg 09/05/2011 1300   KETONESUR NEGATIVE 03/19/2016 1433   PROTEINUR NEGATIVE 03/19/2016 1433   UROBILINOGEN 0.2 03/14/2014 0838   NITRITE NEGATIVE 03/19/2016 1433   LEUKOCYTESUR NEGATIVE 03/19/2016 1433   Sepsis Labs: @LABRCNTIP (procalcitonin:4,lacticidven:4)   Recent Results (from the past 240 hour(s))  MRSA PCR Screening     Status: None   Collection Time: 03/21/16  8:25 AM  Result Value Ref Range Status   MRSA by PCR NEGATIVE NEGATIVE Final    Comment:        The GeneXpert MRSA Assay (FDA approved for NASAL specimens only), is one component of a comprehensive MRSA colonization surveillance program. It is not intended to diagnose MRSA infection nor to guide or monitor treatment for MRSA infections.       Radiology Studies: Dg Chest 2 View Result Date: 03/19/2016 No active cardiopulmonary disease. Electronically Signed   By: Elige KoHetal  Patel    On: 03/19/2016 16:34   Ct Head Wo Contrast Result Date: 03/19/2016 Stable non contrast CT appearance of the brain. No acute intracranial abnormality. Electronically Signed   By: Odessa FlemingH  Hall M.D.   On: 03/19/2016 15:11   Dg Hand Complete Left Result Date: 03/19/2016 No acute abnormality. Moderate first CMC osteoarthritis. Electronically Signed   By: Drusilla Kannerhomas  Dalessio M.D.   On: 03/19/2016 16:33   Dg Hip Unilat W Or Wo Pelvis 2-3 Views Left Result Date: 03/19/2016 Negative. Electronically Signed   By: Kennith CenterEric  Mansell M.D.   On: 03/19/2016 16:30   Mr Brain Wo Contrast Result Date: 03/21/2016  1 cm sized area of acute infarction RIGHT centrum semiovale. Atrophy with chronic microvascular ischemic change, advanced. Electronically Signed   By:  Elsie Stain M.D.   On: 03/21/2016 14:33      Scheduled Meds: . amLODipine  5 mg Oral Daily  . aspirin  300 mg Rectal Daily   Or  . aspirin  325 mg Oral Daily  . atorvastatin  40 mg Oral q1800  . citalopram  10 mg Oral Daily  . clopidogrel  75 mg Oral Daily  . insulin aspart  0-5 Units Subcutaneous QHS  . insulin aspart  0-9 Units Subcutaneous TID WC  . latanoprost  1 drop Both Eyes QHS  . levothyroxine  25 mcg Oral QAC breakfast  . multivitamin with minerals  1 tablet Oral Daily  . sodium chloride flush  3 mL Intravenous Q12H   Continuous Infusions:    LOS: 0 days    Time spent: 25 minutes  Greater than 50% of the time spent on counseling and coordinating the care.   Manson Passey, MD [facility] Pager [phone]  If 7PM-7AM, please contact night-coverage [email or web address] Password [email or web address] 03/22/2016, 9:40 AM

## 2016-03-22 NOTE — Progress Notes (Signed)
No stenosis on the right which is where he has his infarct. If the left is occluded, I do not think that it would range management given his relatively advanced dementia, given that this be an asymptomatic finding.  One could perform a CT angiogram of the head and neck to confirm or deny this, but I would not favor further evaluation, as I suspect would be unlikely to change management in his case. His LDL is at goal, it is possible that his stroke was due to the drops in blood pressure associated with bradycardia and agree with carvedilol being stopped. I would continue Plavix.  No further recommendations at this time, please call neurology with further questions or concerns.  Ritta SlotMcNeill Megan Presti, MD [facility] [phone]  If 7pm- 7am, please page neurology on call as listed in AMION.

## 2016-03-23 DIAGNOSIS — E038 Other specified hypothyroidism: Secondary | ICD-10-CM

## 2016-03-23 LAB — GLUCOSE, CAPILLARY
GLUCOSE-CAPILLARY: 110 mg/dL — AB (ref 65–99)
Glucose-Capillary: 178 mg/dL — ABNORMAL HIGH (ref 65–99)
Glucose-Capillary: 289 mg/dL — ABNORMAL HIGH (ref 65–99)
Glucose-Capillary: 173 mg/dL — ABNORMAL HIGH (ref 65–99)

## 2016-03-23 LAB — HEMOGLOBIN A1C
HEMOGLOBIN A1C: 7.4 % — AB (ref 4.8–5.6)
MEAN PLASMA GLUCOSE: 166 mg/dL

## 2016-03-23 NOTE — Progress Notes (Signed)
Patient ID: Christian Erickson, male   DOB: July 10, 1930, 82 y.o.   MRN: [id number]  PROGRESS NOTE    LANDIN TALLON  WUJ:[id number] DOB: 1931/02/05 DOA: 03/20/2016  PCP: Pcp Not In System   Brief Narrative:  82 y.o. male with past medical history significant for dementia with occasional behavioral issues, DM, HTN and hypothyroidism.   patient presented to ER one day prior to this admission with left leg numbness, unresponsive episode at lunch time and drowsiness. His workup was nonrevealing so patient was subsequently discharged. He now comes back because of another episode of unresponsiveness at breakfast. No seizure-like activity.  In ER, patient was hemodynamically stable. He had slight bradycardia. He was admitted for observation.  Assessment & Plan:   Active Problems: Bradycardia / unresponsiveness - Likely secondary to Coreg which we stopped - HR 59 this am - 2 D ECHO with normal EF  Left arm numbness / Acute CVA in right centrum semiovale - Stroke work up initiated:  - Aspirin daily - MRI brain / MRA brain - acute CVA in right centrum semiovale - 2D ECHO - EF 55-60%  - Carotid doppler - no significant stenosis   - HgbA1c 7.4 - Lipid panel - 70 LDL goal < 100. - Diet:regular  - Therapy: PT/OT - SNF  Other Stroke Risk Factors : Advanced age, history of CVA, hypertension  - Appreciate neurology following   Left arm swelling - Unclear etiology and seems to be better now however this is also one of those problems that brought patient to ED one day prior to this admission - LE doppler negative for DVT  Essential hypertension - Continue Norvasc  Depression - Continue Celexa   DVT prophylaxis: SCD's bilaterally Code Status: DNR/DNI Family Communication: daughter at the bedside Disposition Plan: to SNF once bed available    Consultants:   PT  Procedures:   Lower extremity Doppler - no evidence of DVT  LUE doppler - No DVT  2 D ECHO - EF 55-60%, garde 1  DD  Antimicrobials:   None    Subjective: No overnight events.   Objective: Vitals:   03/22/16 1500 03/22/16 2108 03/23/16 0500 03/23/16 1207  BP: (!) 151/61 (!) 168/67 (!) 160/70 (!) 178/80  Pulse: (!) 57 (!) 54 (!) 53 (!) 54  Resp: 19 18 18    Temp: 98.2 F (36.8 C) 99 F (37.2 C) 98.7 F (37.1 C)   TempSrc: Oral Oral Oral   SpO2: 99% 98% 97%   Weight:      Height:        Intake/Output Summary (Last 24 hours) at 03/23/16 1420 Last data filed at 03/23/16 0500  Gross per 24 hour  Intake                0 ml  Output             1050 ml  Net            -1050 ml   Filed Weights   03/20/16 0956  Weight: 73.5 kg (162 lb)    Examination:  General exam: No distress  Respiratory system: No wheezing, no rhonchi  Cardiovascular system: S1 & S2 heard, RRR Gastrointestinal system: (+) SB, non tender, non distended  Central nervous system: No focal neurological deficits. Extremities: No edema, palpable pulses  Skin: warm, dry  Psychiatry: Normal mood and behavior   Data Reviewed: I have personally reviewed following labs and imaging studies  CBC:  Recent Labs Lab 03/19/16  1445 03/20/16 1024 03/21/16 0320  WBC 5.8 5.9 6.3  NEUTROABS 3.6 4.3  --   HGB 13.3 12.3* 13.1  HCT 39.2 36.2* 39.2  MCV 87.5 87.0 86.5  PLT 129* 121* 120*   Basic Metabolic Panel:  Recent Labs Lab 03/19/16 1445 03/20/16 1024 03/21/16 0320  NA 137 137 136  K 4.0 3.8 3.8  CL 101 101 101  CO2 29 30 29   GLUCOSE 62* 220* 193*  BUN 27* 25* 24*  CREATININE 1.35* 1.37* 1.29*  CALCIUM 9.0 8.6* 8.9   GFR: Estimated Creatinine Clearance: 42 mL/min (by C-G formula based on SCr of 1.29 mg/dL (H)). Liver Function Tests:  Recent Labs Lab 03/19/16 1445  AST 30  ALT 41  ALKPHOS 84  BILITOT 0.5  PROT 7.3  ALBUMIN 3.1*   No results for input(s): LIPASE, AMYLASE in the last 168 hours. No results for input(s): AMMONIA in the last 168 hours. Coagulation Profile: No results for input(s):  INR, PROTIME in the last 168 hours. Cardiac Enzymes:  Recent Labs Lab 03/20/16 1647 03/20/16 2243 03/21/16 0320  TROPONINI <0.03 <0.03 <0.03   BNP (last 3 results) No results for input(s): PROBNP in the last 8760 hours. HbA1C:  Recent Labs  03/22/16 0538  HGBA1C 7.4*   CBG:  Recent Labs Lab 03/22/16 1120 03/22/16 1632 03/22/16 2112 03/23/16 0734 03/23/16 1128  GLUCAP 234* 113* 248* 178* 289*   Lipid Profile:  Recent Labs  03/22/16 0538  CHOL 110  HDL 27*  LDLCALC 70  TRIG 65  CHOLHDL 4.1   Thyroid Function Tests: No results for input(s): TSH, T4TOTAL, FREET4, T3FREE, THYROIDAB in the last 72 hours. Anemia Panel:  Recent Labs  03/22/16 0538  VITAMINB12 429   Urine analysis:    Component Value Date/Time   COLORURINE YELLOW 03/19/2016 1433   APPEARANCEUR CLEAR 03/19/2016 1433   LABSPEC 1.007 03/19/2016 1433   PHURINE 6.5 03/19/2016 1433   GLUCOSEU NEGATIVE 03/19/2016 1433   GLUCOSEU NEGATIVE 01/21/2013 1040   HGBUR NEGATIVE 03/19/2016 1433   HGBUR small 05/11/2008 1059   BILIRUBINUR NEGATIVE 03/19/2016 1433   BILIRUBINUR neg 09/05/2011 1300   KETONESUR NEGATIVE 03/19/2016 1433   PROTEINUR NEGATIVE 03/19/2016 1433   UROBILINOGEN 0.2 03/14/2014 0838   NITRITE NEGATIVE 03/19/2016 1433   LEUKOCYTESUR NEGATIVE 03/19/2016 1433   Sepsis Labs: @LABRCNTIP (procalcitonin:4,lacticidven:4)   Recent Results (from the past 240 hour(s))  MRSA PCR Screening     Status: None   Collection Time: 03/21/16  8:25 AM  Result Value Ref Range Status   MRSA by PCR NEGATIVE NEGATIVE Final    Comment:        The GeneXpert MRSA Assay (FDA approved for NASAL specimens only), is one component of a comprehensive MRSA colonization surveillance program. It is not intended to diagnose MRSA infection nor to guide or monitor treatment for MRSA infections.       Radiology Studies: Dg Chest 2 View Result Date: 03/19/2016 No active cardiopulmonary disease.  Electronically Signed   By: Elige KoHetal  Patel   On: 03/19/2016 16:34   Ct Head Wo Contrast Result Date: 03/19/2016 Stable non contrast CT appearance of the brain. No acute intracranial abnormality. Electronically Signed   By: Odessa FlemingH  Hall M.D.   On: 03/19/2016 15:11   Dg Hand Complete Left Result Date: 03/19/2016 No acute abnormality. Moderate first CMC osteoarthritis. Electronically Signed   By: Drusilla Kannerhomas  Dalessio M.D.   On: 03/19/2016 16:33   Dg Hip Unilat W Or Wo Pelvis 2-3 Views Left  Result Date: 03/19/2016 Negative. Electronically Signed   By: Kennith Center M.D.   On: 03/19/2016 16:30   Mr Brain Wo Contrast Result Date: 03/21/2016  1 cm sized area of acute infarction RIGHT centrum semiovale. Atrophy with chronic microvascular ischemic change, advanced. Electronically Signed   By: Elsie Stain M.D.   On: 03/21/2016 14:33      Scheduled Meds: . amLODipine  5 mg Oral Daily  . aspirin  300 mg Rectal Daily   Or  . aspirin  325 mg Oral Daily  . atorvastatin  40 mg Oral q1800  . citalopram  10 mg Oral Daily  . clopidogrel  75 mg Oral Daily  . insulin aspart  0-5 Units Subcutaneous QHS  . insulin aspart  0-9 Units Subcutaneous TID WC  . latanoprost  1 drop Both Eyes QHS  . levothyroxine  25 mcg Oral QAC breakfast  . multivitamin with minerals  1 tablet Oral Daily  . sodium chloride flush  3 mL Intravenous Q12H   Continuous Infusions:    LOS: 1 day    Time spent: 15 minutes  Greater than 50% of the time spent on counseling and coordinating the care.   Manson Passey, MD [facility] Pager [phone]  If 7PM-7AM, please contact night-coverage [email or web address] Password [email or web address] 03/23/2016, 2:20 PM

## 2016-03-24 DIAGNOSIS — F0390 Unspecified dementia without behavioral disturbance: Secondary | ICD-10-CM

## 2016-03-24 LAB — GLUCOSE, CAPILLARY
GLUCOSE-CAPILLARY: 137 mg/dL — AB (ref 65–99)
GLUCOSE-CAPILLARY: 168 mg/dL — AB (ref 65–99)
Glucose-Capillary: 173 mg/dL — ABNORMAL HIGH (ref 65–99)
Glucose-Capillary: 339 mg/dL — ABNORMAL HIGH (ref 65–99)

## 2016-03-24 LAB — VAS US CAROTID
LCCADSYS: -60 cm/s
LCCAPDIAS: -9 cm/s
LEFT ECA DIAS: -11 cm/s
LEFT VERTEBRAL DIAS: -9 cm/s
LICAPDIAS: -19 cm/s
LICAPSYS: -140 cm/s
Left CCA dist dias: -4 cm/s
Left CCA prox sys: -85 cm/s
RIGHT ECA DIAS: -6 cm/s
Right CCA prox dias: 9 cm/s
Right CCA prox sys: 68 cm/s

## 2016-03-24 MED ORDER — ASPIRIN EC 81 MG PO TBEC: 81.0000 mg | DELAYED_RELEASE_TABLET | Freq: Every day | ORAL | 0 refills | Status: AC

## 2016-03-24 MED ORDER — AMLODIPINE BESYLATE 5 MG PO TABS: 5.0000 mg | ORAL_TABLET | Freq: Every day | ORAL | 0 refills | Status: AC

## 2016-03-24 MED ORDER — CITALOPRAM HYDROBROMIDE 10 MG PO TABS: 10.0000 mg | ORAL_TABLET | Freq: Every day | ORAL | 0 refills | Status: AC

## 2016-03-24 NOTE — Progress Notes (Signed)
Physical Therapy Treatment Patient Details Name: Christian RankJoseph N Erickson MRN: [id number] DOB: 08/27/1930 Today's Date: 03/24/2016    History of Present Illness 82 y.o. male with medical history significant of Alzheimer's dementia with occasional behavioral issues, DM, HTN and hypothyroidism and admitted for syncopal episodes.  MRI brain / MRA brain + acute CVA in right centrum semiovale    PT Comments    New CVA diagnosis since last tx session. Pt required Min-Mod assist for mobility. He participated well with therapy. Continue to recommend ST rehab at SNF to improve strength, gait and balance.   Follow Up Recommendations  SNF     Equipment Recommendations   (TBD at next venue)    Recommendations for Other Services       Precautions / Restrictions Precautions Precautions: Fall Restrictions Weight Bearing Restrictions: No    Mobility  Bed Mobility Overal bed mobility: Needs Assistance Bed Mobility: Supine to Sit     Supine to sit: Mod assist;HOB elevated     General bed mobility comments: Repeated multimodal cues for technique. Assist for trunk and bil LEs.   Transfers Overall transfer level: Needs assistance Equipment used: Rolling walker (2 wheeled) Transfers: Sit to/from Stand Sit to Stand: Mod assist         General transfer comment: Assist to rise, stabilize, control descent. Posterior bias with initial standing-improved with time. Pt stood EOB for several minutes for hygiene. Pt denied dizziness.   Ambulation/Gait Ambulation/Gait assistance: Min assist Ambulation Distance (Feet): 125 Feet Assistive device: Rolling walker (2 wheeled) Gait Pattern/deviations: Step-through pattern;Decreased stride length     General Gait Details: Used RW for safety. Multimodal cueing for safe use of walker. Assist to stabilize pt and maneuver safely with walker. Pt tolerated distance well.   Stairs            Wheelchair Mobility    Modified Rankin (Stroke Patients  Only) Modified Rankin (Stroke Patients Only) Pre-Morbid Rankin Score: Moderate disability Modified Rankin: Moderately severe disability     Balance Overall balance assessment: Needs assistance Sitting-balance support: Feet supported Sitting balance-Leahy Scale: Good       Standing balance-Leahy Scale: Poor                      Cognition Arousal/Alertness: Awake/alert Behavior During Therapy: WFL for tasks assessed/performed Overall Cognitive Status: History of cognitive impairments - at baseline                      Exercises General Exercises - Lower Extremity Ankle Circles/Pumps: AROM;Both;Supine;10 reps Quad Sets: AROM;Both;Supine;10 reps Heel Slides: AROM;Both;Supine;10 reps Straight Leg Raises: AROM;Supine;Both;10 reps    General Comments        Pertinent Vitals/Pain Pain Assessment: No/denies pain    Home Living                      Prior Function            PT Goals (current goals can now be found in the care plan section) Progress towards PT goals: Progressing toward goals    Frequency    Min 3X/week      PT Plan Current plan remains appropriate    Co-evaluation             End of Session Equipment Utilized During Treatment: Gait belt Activity Tolerance: Patient tolerated treatment well Patient left: in chair;with call bell/phone within reach;with family/visitor present;with chair alarm set     Time: [phone]  PT Time Calculation (min) (ACUTE ONLY): 27 min  Charges:  $Gait Training: 8-22 mins $Therapeutic Exercise: 8-22 mins                    G Codes:      Rebeca Alert, MPT Pager: [phone]

## 2016-03-24 NOTE — Clinical Social Work Note (Signed)
Clinical Social Work Assessment  Patient Details  Name: Christian Erickson MRN: [id number] Date of Birth: 06/15/1931  Date of referral:  03/24/16               Reason for consult:  Facility Placement, Discharge Planning                Permission sought to share information with:  Family Supports, Magazine features editoracility Contact Representative, Case Estate manager/land agentManager Permission granted to share information::  Yes, Verbal Permission Granted  Name::      ([name] Radio producerClark)  Agency::   (SNF's )  Relationship::   (Spouse )  Contact Information:   [phone])  Housing/Transportation Living arrangements for the past 2 months:  Assisted Living Facility Source of Information:  Spouse Patient Interpreter Needed:  None Criminal Activity/Legal Involvement Pertinent to Current Situation/Hospitalization:  No - Comment as needed Significant Relationships:  Spouse, Adult Children Lives with:  Facility Resident Do you feel safe going back to the place where you live?  No Need for family participation in patient care:  Yes (Comment)  Care giving concerns:  [facility] and now requiring ST rehab at skilled level.    Social Worker assessment / plan: MSW spoke with wife at length in regards to post-acute placement for SNF. MSW introduced MSW role and SNF placement. Patient's [facility] is [facility] however [facility] due to patient wandering. MS[facility] further [facility] due to wandering behavior. Wife would like [facility]. MSW reviewed locked facilities in the area: [place] and [facility].   MSW sent information to [facility] on Friday and facility confirmed they are able to manage patient and prepared for return. Wife also would like patient to return to if he cannot [facility].   No further concerns reported at this time. MSW remains  available as needed.    Employment status:  Retired Health and safety inspectornsurance information:  Medicare PT Recommendations:  Skilled Nursing Facility Information / Referral to community resources:  Skilled Nursing Facility  Patient/Family's Response to care: Pt alert and disoriented x4. Pt's wife agreeable to SNF however only [facility]. Wife also agreeable to pt returning to ALF. Wife tearful as MSW explains that [facility] not a locked [facility]. Wife involved in care and appreciated social work intervention.   Patient/Family's Understanding of and Emotional Response to Diagnosis, Current Treatment, and Prognosis:  Patient's wife knowledgeable of +stroke.   Emotional Assessment Appearance:  Appears stated age Attitude/Demeanor/Rapport:  Unable to Assess Affect (typically observed):  Unable to Assess Orientation:   (Disoriented x4. ) Alcohol / Substance use:  Not Applicable Psych involvement (Current and /or in the community):  No (Comment)  Discharge Needs  Concerns to be addressed:  Care Coordination Readmission within the last 30 days:  No Current discharge risk:  Cognitively Impaired, Dependent with Mobility, Chronically ill Barriers to Discharge:  Continued Medical Work up   The Sherwin-WilliamsBashira Davionte Lusby, MSW [phone] 03/24/2016 11:35 AM

## 2016-03-24 NOTE — [place] FL2 (Signed)
[place] MEDICAID FL2 LEVEL OF CARE SCREENING TOOL     IDENTIFICATION  Patient Name: Christian Erickson Birthdate: 03-Jun-1931 Sex: male Admission Date (Current Location): 03/20/2016  [facility] and [place] Number:  Producer, television/film/video and Address:  [facility],  [address], [place] [address]      Provider Number: [id number]  Attending Physician Name and Address:  Alison Murray, MD  Relative Name and Phone Number:       Current Level of Care: Hospital Recommended Level of Care: Skilled Nursing Facility Prior Approval Number:    Date Approved/Denied:   PASRR Number:    Discharge Plan: SNF    Current Diagnoses: Patient Active Problem List   Diagnosis Date Noted  . CVA (cerebral infarction) 03/22/2016  . Syncope 03/20/2016  . Bradycardia 03/20/2016  . PCP NOTES >>>> 05/21/2015  . Mixed Alzheimer's and vascular dementia 10/04/2013  . Stroke (HCC) 07/23/2013  . Acute CVA (cerebrovascular accident) (HCC) 07/23/2013  . Loss of weight 12/11/2012  . Memory difficulty 05/26/2012  . Hypothyroidism 01/20/2012  . Medicare annual wellness visit, subsequent 01/06/2012  . Edema 01/06/2012  . BPH (benign prostatic hyperplasia) 09/05/2011  . DJD (degenerative joint disease) 06/24/2011  . ALLERGIC RHINITIS 09/10/2010  . BACK PAIN 08/21/2009  . DMII (diabetes mellitus, type 2) (HCC) 05/25/2009  . THROMBOCYTOPENIA 08/30/2008  . GLAUCOMA 08/30/2008  . ERECTILE DYSFUNCTION 01/25/2007  . Dyslipidemia 12/15/2006  . Essential hypertension 12/15/2006  . DIVERTICULITIS, HX OF 12/15/2006    Orientation RESPIRATION BLADDER Height & Weight     Self  Normal Incontinent, External catheter Weight: 162 lb (73.5 kg) Height:  5\' 8"  (172.7 cm)  BEHAVIORAL SYMPTOMS/MOOD NEUROLOGICAL BOWEL NUTRITION STATUS  [place]  (none ) Continent Diet (Heart Healthy )  AMBULATORY STATUS COMMUNICATION OF NEEDS Skin   Limited Assist Verbally Normal                       Personal Care  Assistance Level of Assistance  Bathing, Feeding, Dressing Bathing Assistance: Limited assistance Feeding assistance: Independent Dressing Assistance: Limited assistance     Functional Limitations Info  Speech, Hearing, Sight Sight Info: Adequate Hearing Info: Adequate Speech Info: Adequate    SPECIAL CARE FACTORS FREQUENCY  PT (By licensed PT)     PT Frequency: 3              Contractures      Additional Factors Info  Code Status, Allergies Code Status Info:  (DNR CODE ) Allergies Info:  (Beef-derived products, Pork-derived products, Vasotec )           Current Medications (03/24/2016):  This is the current hospital active medication list Current Facility-Administered Medications  Medication Dose Route Frequency Provider Last Rate Last Dose  . acetaminophen (TYLENOL) tablet 1,000 mg  1,000 mg Oral Q6H PRN Harlo Art, DO   1,000 mg at 03/21/16 1104  . amLODipine (NORVASC) tablet 5 mg  5 mg Oral Daily Alison Murray, MD   5 mg at 03/24/16 1031  . aspirin suppository 300 mg  300 mg Rectal Daily Alison Murray, MD       Or  . aspirin tablet 325 mg  325 mg Oral Daily Alison Murray, MD   325 mg at 03/24/16 1031  . atorvastatin (LIPITOR) tablet 40 mg  40 mg Oral q1800 Augustin Art, DO   40 mg at 03/23/16 2005  . citalopram (CELEXA) tablet 10 mg  10 mg  Oral Daily Alison MurrayAlma M Devine, MD   10 mg at 03/24/16 1032  . clopidogrel (PLAVIX) tablet 75 mg  75 mg Oral Daily Culley ArtJessica U Vann, DO   75 mg at 03/24/16 1031  . fluticasone (FLONASE) 50 MCG/ACT nasal spray 2 spray  2 spray Each Nare Daily PRN Amias ArtJessica U Vann, DO      . insulin aspart (novoLOG) injection 0-5 Units  0-5 Units Subcutaneous QHS Price ArtJessica U Vann, DO   2 Units at 03/22/16 2343  . insulin aspart (novoLOG) injection 0-9 Units  0-9 Units Subcutaneous TID WC Kaleb ArtJessica U Vann, DO   2 Units at 03/24/16 0757  . latanoprost (XALATAN) 0.005 % ophthalmic solution 1 drop  1 drop Both Eyes QHS Eytan ArtJessica U Vann, DO   1 drop at 03/23/16  2139  . levothyroxine (SYNTHROID, LEVOTHROID) tablet 25 mcg  25 mcg Oral QAC breakfast Delwin ArtJessica U Vann, DO   25 mcg at 03/24/16 0757  . multivitamin with minerals tablet 1 tablet  1 tablet Oral Daily Dangelo ArtJessica U Vann, DO   1 tablet at 03/24/16 1031  . senna-docusate (Senokot-S) tablet 1 tablet  1 tablet Oral QHS PRN Sota ArtJessica U Vann, DO      . sodium chloride flush (NS) 0.9 % injection 3 mL  3 mL Intravenous Q12H Kolbee ArtJessica U Vann, DO   3 mL at 03/23/16 2140     Discharge Medications: Please see discharge summary for a list of discharge medications.  Relevant Imaging Results:  Relevant Lab Results:   Additional Information  (SSN [id number])  Derenda FennelNixon, Adewale Pucillo A

## 2016-03-24 NOTE — Clinical Social Work Placement (Signed)
   CLINICAL SOCIAL WORK PLACEMENT  NOTE  Date:  03/24/2016  Patient Details  Name: Christian Erickson MRN: [id number] Date of Birth: May 11, 1931  Clinical Social Work is seeking post-discharge placement for this patient at the Skilled  Nursing Facility level of care (*CSW will initial, date and re-position this form in  chart as items are completed):  Yes   Patient/family provided with [facility] Clinical Social Work Department's list of facilities offering this level of care within the geographic area requested by the patient (or if unable, by the patient's family).  Yes   Patient/family informed of their freedom to choose among providers that offer the needed level of care, that participate in Medicare, Medicaid or managed care program needed by the patient, have an available bed and are willing to accept the patient.  Yes   Patient/family informed of [facility]'s ownership interest in [facility] and [facility], as well as of the fact that they are under no obligation to receive care at these facilities.  PASRR submitted to EDS on 03/24/16     PASRR number received on       Existing PASRR number confirmed on       FL2 transmitted to all facilities in geographic area requested by pt/family on 03/24/16     FL2 transmitted to all facilities within larger geographic area on       Patient informed that his/her managed care company has contracts with or will negotiate with certain facilities, including the following:            Patient/family informed of bed offers received.  Patient chooses bed at       Physician recommends and patient chooses bed at      Patient to be transferred to   on  .  Patient to be transferred to facility by       Patient family notified on   of transfer.  Name of family member notified:        PHYSICIAN Please sign FL2     Additional Comment:    _______________________________________________ Derenda FennelNixon, Loreena Valeri A 03/24/2016, 11:36 AM

## 2016-03-24 NOTE — Clinical Social Work Note (Addendum)
Clinical updates faxed to [facility] for review. Facility representative plans to come evaluate patient for return.   W[facility]'s requested facilites, [facility], [facility] did NOT make bed offers.   [facility]. Wife aware of offer.   Derenda FennelBashira Christasia Angeletti, MSW [phone] 03/24/2016 2:09 PM

## 2016-03-24 NOTE — Discharge Summary (Addendum)
Physician Discharge Summary  Christian RankJoseph N Erickson [id number] DOB: [birth year] DOA: 03/20/2016  PCP: Pcp Not In System  Admit date: 03/20/2016 Discharge date: 03/24/2016  Recommendations for Outpatient Follow-up:  May resume Hctz and new medication for BP Norvasc Continue to hold Coreg as it has likely cause patient's bradycardia. His heart rate is 54-65.  Medication is also Celexa instead of Seroquel.  Discharge Diagnoses:  Active Problems:   DMII (diabetes mellitus, type 2) (HCC)   Essential hypertension   Hypothyroidism   Mixed Alzheimer's and vascular dementia   Syncope   Bradycardia   CVA (cerebral infarction)    Discharge Condition: stable   Diet recommendation: as tolerated   History of present illness:  82 y.o.malewith past medical history significant for dementia with occasional behavioral issues, DM, HTN and hypothyroidism.  patient presented to ER one day prior to this admission with left leg numbness, unresponsive episode at lunch time and drowsiness. His workup was nonrevealing so patient was subsequently discharged. He now comes back because of another episode of unresponsiveness at breakfast. No seizure-like activity.  In ER, patient was hemodynamically stable. He had slight bradycardia. He was admitted for observation.  Hospital Course:    Assessment & Plan:   Active Problems: Bradycardia / unresponsiveness - Likely secondary to Coreg which we stopped; possibility for bradycardia also includes stroke  - 2 D ECHO with normal EF  Left arm numbness / Acute CVA in right centrum semiovale - Stroke work up initiated:  - Aspirin daily and plavix daily - Patient has dementia and has tendency to wander around for which reason he is in a memory care unit. He is at high risk of falls for which reason would not recommend Coumadin for stroke prevention - MRI brain / MRA brain - acute CVA in right centrum semiovale - 2D ECHO - EF 55-60%  - Carotid doppler - no  significant stenosis   - HgbA1c 7.4 - Lipid panel - 70 LDL goal < 100. - Diet:regular  - Therapy: PT/OT - SNF  Other Stroke Risk Factors : Advanced age, history of CVA, hypertension   Left arm swelling - Unclear etiology and seems to be better now however this is also one of those problems that brought patient to ED one day prior to this admission - LE doppler negative for DVT  Essential hypertension - Continue Norvasc, Hctz  - Holding coreg due to bradycardia   Depression - Continue Celexa   Diabetes mellitus without complications without long term insulin use - Continue amaryl and onglyza   DVT prophylaxis: SCD's bilaterally Code Status: DNR/DNI Family Communication: daughter and wife at the bedside    Consultants:   PT  Procedures:   Lower extremity Doppler - no evidence of DVT  LUE doppler - No DVT  2 D ECHO - EF 55-60%, garde 1 DD  Antimicrobials:   None     Signed:  Manson PasseyEVINE, Caedyn Tassinari, MD  [facility] 03/24/2016, 11:47 AM  Pager #: [phone]  Time spent in minutes: more than 30 minutes  Discharge Exam: Vitals:   03/24/16 0142 03/24/16 0449  BP: (!) 144/80 (!) 148/68  Pulse: 60 65  Resp: 20 20  Temp: 99.3 F (37.4 C) 98.5 F (36.9 C)   Vitals:   03/23/16 1511 03/23/16 2150 03/24/16 0142 03/24/16 0449  BP: (!) 143/56 (!) 142/68 (!) 144/80 (!) 148/68  Pulse: 62 (!) 57 60 65  Resp: 19 20 20 20   Temp: 98.4 F (36.9 C) 99.3 F (37.4 C)  99.3 F (37.4 C) 98.5 F (36.9 C)  TempSrc: Oral Oral Oral Oral  SpO2: 98% 97% 97% 100%  Weight:      Height:        General: Pt is alert, not in acute distress Cardiovascular: Regular rate and rhythm, S1/S2 + Respiratory: Clear to auscultation bilaterally, no wheezing, no crackles, no rhonchi Abdominal: Soft, non tender, non distended, bowel sounds +, no guarding Extremities: no edema, no cyanosis, pulses palpable bilaterally DP and PT Neuro: Grossly nonfocal  Discharge  Instructions  Discharge Instructions    Call MD for:  persistant nausea and vomiting    Complete by:  As directed    Call MD for:  redness, tenderness, or signs of infection (pain, swelling, redness, odor or green/yellow discharge around incision site)    Complete by:  As directed    Call MD for:  severe uncontrolled pain    Complete by:  As directed    Diet - low sodium heart healthy    Complete by:  As directed    Discharge instructions    Complete by:  As directed    May resume Hctz and new medication for BP Norvasc Continue to hold Coreg as it has likely cause patient's bradycardia. His heart rate is 54-65.  Medication is also Celexa instead of Seroquel.   Increase activity slowly    Complete by:  As directed        Medication List    STOP taking these medications   carvedilol 25 MG tablet Commonly known as:  COREG   furosemide 20 MG tablet Commonly known as:  LASIX   QUEtiapine 25 MG tablet Commonly known as:  SEROQUEL     TAKE these medications   amLODipine 5 MG tablet Commonly known as:  NORVASC Take 1 tablet (5 mg total) by mouth daily. Start taking on:  03/25/2016   aspirin EC 81 MG tablet Take 1 tablet (81 mg total) by mouth daily.   atorvastatin 40 MG tablet Commonly known as:  LIPITOR Take 1 tablet (40 mg total) by mouth daily at 6 PM.   bimatoprost 0.01 % Soln Commonly known as:  LUMIGAN Place 1 drop into both eyes at bedtime.   citalopram 10 MG tablet Commonly known as:  CELEXA Take 1 tablet (10 mg total) by mouth daily. Start taking on:  03/25/2016   clopidogrel 75 MG tablet Commonly known as:  PLAVIX Take 1 tablet (75 mg total) by mouth daily with breakfast. What changed:  when to take this   ENSURE PO Take 237 mLs by mouth daily.   EPINEPHrine 0.3 mg/0.3 mL Soaj injection Commonly known as:  EPIPEN 2-PAK Inject 0.3 mLs (0.3 mg total) into the muscle once. What changed:  when to take this  reasons to take this   fluticasone 50  MCG/ACT nasal spray Commonly known as:  FLONASE Place 2 sprays into both nostrils daily as needed for allergies or rhinitis. What changed:  when to take this   glipiZIDE 10 MG tablet Commonly known as:  GLUCOTROL Take 10 mg by mouth daily.   hydrochlorothiazide 25 MG tablet Commonly known as:  HYDRODIURIL Take 25 mg by mouth daily.   IMODIUM A-D 1 MG/7.5ML Liqd Generic drug:  Loperamide HCl Take 30 mLs by mouth 3 (three) times daily as needed (For loose stool.).   levothyroxine 25 MCG tablet Commonly known as:  SYNTHROID, LEVOTHROID Take 1 tablet (25 mcg total) by mouth daily before breakfast.   MAPAP 500 MG  tablet Generic drug:  acetaminophen Take 1,000 mg by mouth every 6 (six) hours as needed (For pain.).   multivitamin with minerals Tabs tablet Take 1 tablet by mouth daily.   NAMENDA 10 MG tablet Generic drug:  memantine TAKE 1 TABLET TWICE A DAY   saxagliptin HCl 5 MG Tabs tablet Commonly known as:  ONGLYZA Take 1 tablet (5 mg total) by mouth daily.   SENEXON-S 8.6-50 MG tablet Generic drug:  senna-docusate Take 1 tablet by mouth at bedtime as needed (For constipation.).   tamsulosin 0.4 MG Caps capsule Commonly known as:  FLOMAX Take 1 capsule (0.4 mg total) by mouth daily.         The results of significant diagnostics from this hospitalization (including imaging, microbiology, ancillary and laboratory) are listed below for reference.    Significant Diagnostic Studies: Dg Chest 2 View  Result Date: 03/19/2016 CLINICAL DATA:  Altered mental status. EXAM: CHEST  2 VIEW COMPARISON:  07/23/2013 FINDINGS: The heart size and mediastinal contours are within normal limits. Both lungs are clear. The visualized skeletal structures are unremarkable. IMPRESSION: No active cardiopulmonary disease. Electronically Signed   By: Elige Ko   On: 03/19/2016 16:34   Ct Head Wo Contrast  Result Date: 03/19/2016 CLINICAL DATA:  82 year old male with altered mental  status. Initial encounter. EXAM: CT HEAD WITHOUT CONTRAST TECHNIQUE: Contiguous axial images were obtained from the base of the skull through the vertex without intravenous contrast. COMPARISON:  Head CT without contrast 02/22/2016 and earlier. FINDINGS: Brain: Stable gray-white matter differentiation throughout the brain. Patchy cerebral white matter hypodensity and evidence of a chronic lacunar infarct at the left cauda thalamic groove again noted. Stable cerebral volume. No midline shift, mass effect, or evidence of intracranial mass lesion. No ventriculomegaly. No acute intracranial hemorrhage identified. No cortically based acute infarct identified. Vascular: Calcified atherosclerosis at the skull base. No suspicious intracranial vascular hyperdensity. Skull: No acute osseous abnormality identified. Sinuses/Orbits: Stable, chronic left frontal sinus opacification. Other: No acute orbit or scalp soft tissue finding. IMPRESSION: Stable non contrast CT appearance of the brain. No acute intracranial abnormality. Electronically Signed   By: Odessa Fleming M.D.   On: 03/19/2016 15:11   Mr Brain Wo Contrast  Result Date: 03/21/2016 CLINICAL DATA:  82 year old male with dementia, presenting with LEFT leg numbness, and altered mental status. : MRI HEAD WITHOUT CONTRAST TECHNIQUE: Multiplanar, multiecho pulse sequences of the brain and surrounding structures were obtained without intravenous contrast. COMPARISON:  CT head 03/19/2016. FINDINGS: 1 cm sized area restricted diffusion is seen in the RIGHT centrum semiovale consistent with acute deep white matter infarct. No other similar lesions. Global atrophy. Moderately advanced Chronic microvascular ischemic change. No hemorrhage, mass lesion, or extra-axial fluid. Flow voids are maintained. Multiple areas of chronic lacunar infarction, most notable in the LEFT thalamus/ genu internal capsule region. Extracranial soft tissues demonstrate chronic fluid accumulation in the  LEFT frontal sinus. Compared with CT scan 2 days ago, the infarct is not visible. IMPRESSION: 1 cm sized area of acute infarction RIGHT centrum semiovale. Atrophy with chronic microvascular ischemic change, advanced. Electronically Signed   By: Elsie Stain M.D.   On: 03/21/2016 14:33   Dg Hand Complete Left  Result Date: 03/19/2016 CLINICAL DATA:  Left 10 swelling of unknown etiology. No known injury. EXAM: LEFT HAND - COMPLETE 3+ VIEW COMPARISON:  None. FINDINGS: No acute bony or joint abnormality is seen. Moderate first CMC osteoarthritis is noted. Soft tissues are unremarkable. IMPRESSION: No acute abnormality.  Moderate first CMC osteoarthritis. Electronically Signed   By: Drusilla Kanner M.D.   On: 03/19/2016 16:33   Dg Hip Unilat W Or Wo Pelvis 2-3 Views Left  Result Date: 03/19/2016 CLINICAL DATA:  Left leg swelling. EXAM: DG HIP (WITH OR WITHOUT PELVIS) 2-3V LEFT COMPARISON:  None. FINDINGS: Frontal pelvis shows no fracture. SI joints and symphysis pubis are unremarkable. AP and frog-leg lateral views of the left hip are without evidence for femoral neck fracture. IMPRESSION: Negative. Electronically Signed   By: Kennith Center M.D.   On: 03/19/2016 16:30    Microbiology: Recent Results (from the past 240 hour(s))  MRSA PCR Screening     Status: None   Collection Time: 03/21/16  8:25 AM  Result Value Ref Range Status   MRSA by PCR NEGATIVE NEGATIVE Final    Comment:        The GeneXpert MRSA Assay (FDA approved for NASAL specimens only), is one component of a comprehensive MRSA colonization surveillance program. It is not intended to diagnose MRSA infection nor to guide or monitor treatment for MRSA infections.      Labs: Basic Metabolic Panel:  Recent Labs Lab 03/19/16 1445 03/20/16 1024 03/21/16 0320  NA 137 137 136  K 4.0 3.8 3.8  CL 101 101 101  CO2 29 30 29   GLUCOSE 62* 220* 193*  BUN 27* 25* 24*  CREATININE 1.35* 1.37* 1.29*  CALCIUM 9.0 8.6* 8.9   Liver  Function Tests:  Recent Labs Lab 03/19/16 1445  AST 30  ALT 41  ALKPHOS 84  BILITOT 0.5  PROT 7.3  ALBUMIN 3.1*   No results for input(s): LIPASE, AMYLASE in the last 168 hours. No results for input(s): AMMONIA in the last 168 hours. CBC:  Recent Labs Lab 03/19/16 1445 03/20/16 1024 03/21/16 0320  WBC 5.8 5.9 6.3  NEUTROABS 3.6 4.3  --   HGB 13.3 12.3* 13.1  HCT 39.2 36.2* 39.2  MCV 87.5 87.0 86.5  PLT 129* 121* 120*   Cardiac Enzymes:  Recent Labs Lab 03/20/16 1647 03/20/16 2243 03/21/16 0320  TROPONINI <0.03 <0.03 <0.03   BNP: BNP (last 3 results)  Recent Labs  03/19/16 1445  BNP 150.3*    ProBNP (last 3 results) No results for input(s): PROBNP in the last 8760 hours.  CBG:  Recent Labs Lab 03/23/16 0734 03/23/16 1128 03/23/16 1628 03/23/16 2147 03/24/16 0745  GLUCAP 178* 289* 110* 173* 173*

## 2016-03-24 NOTE — Discharge Instructions (Signed)
Stroke Prevention Some medical conditions and behaviors are associated with an increased chance of having a stroke. You may prevent a stroke by making healthy choices and managing medical conditions. HOW CAN I REDUCE MY RISK OF HAVING A STROKE?   Stay physically active. Get at least 30 minutes of activity on most or all days.  Do not smoke. It may also be helpful to avoid exposure to secondhand smoke.  Limit alcohol use. Moderate alcohol use is considered to be:  No more than 2 drinks per day for men.  No more than 1 drink per day for nonpregnant women.  Eat healthy foods. This involves:  Eating 5 or more servings of fruits and vegetables a day.  Making dietary changes that address high blood pressure (hypertension), high cholesterol, diabetes, or obesity.  Manage your cholesterol levels.  Making food choices that are high in fiber and low in saturated fat, trans fat, and cholesterol may control cholesterol levels.  Take any prescribed medicines to control cholesterol as directed by your health care provider.  Manage your diabetes.  Controlling your carbohydrate and sugar intake is recommended to manage diabetes.  Take any prescribed medicines to control diabetes as directed by your health care provider.  Control your hypertension.  Making food choices that are low in salt (sodium), saturated fat, trans fat, and cholesterol is recommended to manage hypertension.  Ask your health care provider if you need treatment to lower your blood pressure. Take any prescribed medicines to control hypertension as directed by your health care provider.  If you are 18-39 years of age, have your blood pressure checked every 3-5 years. If you are 40 years of age or older, have your blood pressure checked every year.  Maintain a healthy weight.  Reducing calorie intake and making food choices that are low in sodium, saturated fat, trans fat, and cholesterol are recommended to manage  weight.  Stop drug abuse.  Avoid taking birth control pills.  Talk to your health care provider about the risks of taking birth control pills if you are over 35 years old, smoke, get migraines, or have ever had a blood clot.  Get evaluated for sleep disorders (sleep apnea).  Talk to your health care provider about getting a sleep evaluation if you snore a lot or have excessive sleepiness.  Take medicines only as directed by your health care provider.  For some people, aspirin or blood thinners (anticoagulants) are helpful in reducing the risk of forming abnormal blood clots that can lead to stroke. If you have the irregular heart rhythm of atrial fibrillation, you should be on a blood thinner unless there is a good reason you cannot take them.  Understand all your medicine instructions.  Make sure that other conditions (such as anemia or atherosclerosis) are addressed. SEEK IMMEDIATE MEDICAL CARE IF:   You have sudden weakness or numbness of the face, arm, or leg, especially on one side of the body.  Your face or eyelid droops to one side.  You have sudden confusion.  You have trouble speaking (aphasia) or understanding.  You have sudden trouble seeing in one or both eyes.  You have sudden trouble walking.  You have dizziness.  You have a loss of balance or coordination.  You have a sudden, severe headache with no known cause.  You have new chest pain or an irregular heartbeat. Any of these symptoms may represent a serious problem that is an emergency. Do not wait to see if the symptoms will   go away. Get medical help at once. Call your local emergency services (911 in [place] Do not drive yourself to the hospital.   This information is not intended to replace advice given to you by your health care provider. Make sure you discuss any questions you have with your health care provider.   Document Released: 07/31/2004 Document Revised: 07/14/2014 Document Reviewed:  12/24/2012 Elsevier Interactive Patient Education 2016 [employer]  

## 2016-03-25 ENCOUNTER — Emergency Department (HOSPITAL_COMMUNITY)
Admission: EM | Admit: 2016-03-25 | Discharge: 2016-03-25 | Disposition: A | Discharge status: Home / Self Care | Payer: Medicare Other | Attending: Emergency Medicine | Admitting: Emergency Medicine

## 2016-03-25 ENCOUNTER — Encounter (HOSPITAL_COMMUNITY): Payer: Self-pay | Admitting: Emergency Medicine

## 2016-03-25 DIAGNOSIS — E039 Hypothyroidism, unspecified: Secondary | ICD-10-CM | POA: Insufficient documentation

## 2016-03-25 DIAGNOSIS — Z79899 Other long term (current) drug therapy: Secondary | ICD-10-CM | POA: Insufficient documentation

## 2016-03-25 DIAGNOSIS — Z87891 Personal history of nicotine dependence: Secondary | ICD-10-CM | POA: Insufficient documentation

## 2016-03-25 DIAGNOSIS — G309 Alzheimer's disease, unspecified: Secondary | ICD-10-CM | POA: Insufficient documentation

## 2016-03-25 DIAGNOSIS — Z7982 Long term (current) use of aspirin: Secondary | ICD-10-CM | POA: Insufficient documentation

## 2016-03-25 DIAGNOSIS — E119 Type 2 diabetes mellitus without complications: Secondary | ICD-10-CM | POA: Insufficient documentation

## 2016-03-25 DIAGNOSIS — I10 Essential (primary) hypertension: Secondary | ICD-10-CM | POA: Insufficient documentation

## 2016-03-25 DIAGNOSIS — Z751 Person awaiting admission to adequate facility elsewhere: Secondary | ICD-10-CM | POA: Insufficient documentation

## 2016-03-25 DIAGNOSIS — Z7984 Long term (current) use of oral hypoglycemic drugs: Secondary | ICD-10-CM | POA: Insufficient documentation

## 2016-03-25 LAB — GLUCOSE, CAPILLARY
GLUCOSE-CAPILLARY: 177 mg/dL — AB (ref 65–99)
GLUCOSE-CAPILLARY: 194 mg/dL — AB (ref 65–99)
Glucose-Capillary: 195 mg/dL — ABNORMAL HIGH (ref 65–99)

## 2016-03-25 MED ORDER — MEMANTINE HCL 10 MG PO TABS
10.0000 mg | ORAL_TABLET | Freq: Two times a day (BID) | ORAL | Status: DC
  Administered 2016-03-25: 10 mg via ORAL
  Filled 2016-03-25: qty 1

## 2016-03-25 MED ORDER — ATORVASTATIN CALCIUM 40 MG PO TABS
40.0000 mg | ORAL_TABLET | Freq: Every day | ORAL | Status: DC
Start: 2016-03-25 — End: 2016-03-26
  Administered 2016-03-25: 40 mg via ORAL
  Filled 2016-03-25: qty 1

## 2016-03-25 NOTE — ED Triage Notes (Signed)
[facility] following discharge from the 4th floor. Pt's wife did not consent to this placement. Pt was sent there, then immediately sent back here to seek new placement.

## 2016-03-25 NOTE — Progress Notes (Signed)
PTAR called for transportation. Patient and patients wife notified of transportation to [facility]. RN to call report.   Stacy GardnerErin Daejah Klebba, LCSWA Clinical Social Worker [phone]

## 2016-03-25 NOTE — Progress Notes (Signed)
Patient seen and examined at the bedside. Patient is medically stable for discharge today to skilled nursing facility. Patient's wife is looking at different skilled nursing facility choices and hopes that one of them works out for her husband. Please refer to discharge summary completed 03/24/2016. No changes in medications since 03/24/2016.  Christian Passeylma Devine [facility] [phone]

## 2016-03-25 NOTE — Progress Notes (Signed)
PTAR arrived to pick up patient to transport to SNF. SW had not spoken with the RN regarding discharge. RN was not aware that PTAR had been called or that RN needed to call report to SNF. RN looked on shadow chart and found the folder containing information that is normal for a discharge to a SNF. PTAR left with patient. About 10 minutes later the wife arrives to the unit upset that he was transported. She had not agreed for the [facility] to [facility]. SW apparently called for transport before confirming with the wife that she was in agreement with the facility. [facility] and Sharin Mons[facility] was to transport patient back to hospital.

## 2016-03-25 NOTE — ED Notes (Signed)
Report called to facility. Ptar in route

## 2016-03-25 NOTE — Progress Notes (Signed)
CSW faxed FL2 and discharge summary to facility.   Stacy GardnerErin Korbin Notaro, LCSWA Clinical Social Worker [phone]

## 2016-03-25 NOTE — ED Notes (Signed)
[facility] Expected date:  Expected time:  Means of arrival:  Comments: PTAR

## 2016-03-25 NOTE — ED Provider Notes (Signed)
[facility]-EMERGENCY DEPT Provider Note   CSN: [id number] Arrival date & time: 03/25/16  1932  By signing my name below, I, Majel HomerPeyton Lee, attest that this documentation has been prepared under the direction and in the presence of non-physician practitioner, Antony MaduraKelly Elesha Thedford, PA-C. Electronically Signed: Majel HomerPeyton Lee, Scribe. 03/25/2016. 8:22 PM.  History   Chief Complaint Chief Complaint  Patient presents with  . needs placement   The history is provided by the patient and the spouse. No language interpreter was used.   HPI Comments: Christian Erickson is a 82 y.o. male with PMHx of alzheimer's dementia, HLD, HTN and DM, who presents to the Emergency Department des[facility]. Per wife, pt visited the ED on 03/20/16 and [facility] 4[facility] for abnormalities of his gait and mobility. She states she was consulted by a Child psychotherapistsocial worker, Derenda FennelBashira Nixon, about admitting [facility] to [facility] after discharge. She notes she visited the facility, thought it was "inappropriate" for pt and did not give consent to transfer him. Pt's wife reports she came back to the hospital from visiting the facility and [facility] had already [facility]. She reports she is visiting the ED again tonight to [facility]. Pt denies any complaints at this time.   Past Medical History:  Diagnosis Date  . Allergic rhinitis   . Alzheimer's dementia   . Arthritis   . Blood dyscrasia   . BPH (benign prostatic hypertrophy)    nocturia  . Diabetes mellitus   . Diverticulitis   . ED (erectile dysfunction)   . Glaucoma    sees eye doctor routinely  . Hyperlipidemia   . Hypertension   . Subclinical hypothyroidism     Patient Active Problem List   Diagnosis Date Noted  . CVA (cerebral infarction) 03/22/2016  . Syncope 03/20/2016  . Bradycardia 03/20/2016  . PCP NOTES >>>> 05/21/2015  . Mixed Alzheimer's and vascular dementia 10/04/2013  .  Stroke (HCC) 07/23/2013  . Acute CVA (cerebrovascular accident) (HCC) 07/23/2013  . Loss of weight 12/11/2012  . Memory difficulty 05/26/2012  . Hypothyroidism 01/20/2012  . Medicare annual wellness visit, subsequent 01/06/2012  . Edema 01/06/2012  . BPH (benign prostatic hyperplasia) 09/05/2011  . DJD (degenerative joint disease) 06/24/2011  . ALLERGIC RHINITIS 09/10/2010  . BACK PAIN 08/21/2009  . DMII (diabetes mellitus, type 2) (HCC) 05/25/2009  . THROMBOCYTOPENIA 08/30/2008  . GLAUCOMA 08/30/2008  . ERECTILE DYSFUNCTION 01/25/2007  . Dyslipidemia 12/15/2006  . Essential hypertension 12/15/2006  . DIVERTICULITIS, HX OF 12/15/2006    Past Surgical History:  Procedure Laterality Date  . COLECTOMY     w/ reversal due to divertiuclitis per patient in 1990s aprox  . INGUINAL HERNIA REPAIR    . sbo repair    . TOTAL KNEE ARTHROPLASTY Right 10/27/2012   Procedure: TOTAL KNEE ARTHROPLASTY;  Surgeon: Loreta Aveaniel F Murphy, MD;  [facility];  Service: Orthopedics;  Laterality: Right;    Home Medications    Prior to Admission medications   Medication Sig Start Date End Date Taking? Authorizing Provider  acetaminophen (MAPAP) 500 MG tablet Take 1,000 mg by mouth every 6 (six) hours as needed (For pain.).    Historical Provider, MD  amLODipine (NORVASC) 5 MG tablet Take 1 tablet (5 mg total) by mouth daily. 03/25/16   Alison MurrayAlma M Devine, MD  aspirin EC 81 MG tablet Take 1 tablet (81 mg total) by mouth daily.  03/24/16   Alison Murray, MD  atorvastatin (LIPITOR) 40 MG tablet Take 1 tablet (40 mg total) by mouth daily at 6 PM. 06/13/15   Wanda Plump, MD  bimatoprost (LUMIGAN) 0.01 % SOLN Place 1 drop into both eyes at bedtime.    Historical Provider, MD  citalopram (CELEXA) 10 MG tablet Take 1 tablet (10 mg total) by mouth daily. 03/25/16   Alison Murray, MD  clopidogrel (PLAVIX) 75 MG tablet Take 1 tablet (75 mg total) by mouth daily with breakfast. Patient taking differently: Take 75 mg by mouth  daily.  10/03/14   Wanda Plump, MD  EPINEPHrine (EPIPEN 2-PAK) 0.3 mg/0.3 mL IJ SOAJ injection Inject 0.3 mLs (0.3 mg total) into the muscle once. Patient taking differently: Inject 0.3 mg into the muscle as needed (For anaphylaxis.).  01/15/15   Wanda Plump, MD  fluticasone [facility]) 50 MCG/ACT nasal spray Place 2 sprays into both nostrils daily as needed for allergies or rhinitis. Patient taking differently: Place 2 sprays into both nostrils daily.  01/15/15   Wanda Plump, MD  glipiZIDE (GLUCOTROL) 10 MG tablet Take 10 mg by mouth daily. 03/17/16   Historical Provider, MD  hydrochlorothiazide (HYDRODIURIL) 25 MG tablet Take 25 mg by mouth daily.    Historical Provider, MD  levothyroxine (SYNTHROID, LEVOTHROID) 25 MCG tablet Take 1 tablet (25 mcg total) by mouth daily before breakfast. 06/12/15   Wanda Plump, MD  Loperamide HCl (IMODIUM A-D) 1 MG/7.5ML LIQD Take 30 mLs by mouth 3 (three) times daily as needed (For loose stool.).     Historical Provider, MD  Multiple Vitamin (MULTIVITAMIN WITH MINERALS) TABS tablet Take 1 tablet by mouth daily. 07/27/13   Rudi Art, DO  NAMENDA 10 MG tablet TAKE 1 TABLET TWICE A DAY 11/24/14   Drema Dallas, DO  Nutritional Supplements (ENSURE PO) Take 237 mLs by mouth daily.    Historical Provider, MD  saxagliptin HCl (ONGLYZA) 5 MG TABS tablet Take 1 tablet (5 mg total) by mouth daily. 04/18/15   Wanda Plump, MD  senna-docusate (SENEXON-S) 8.6-50 MG tablet Take 1 tablet by mouth at bedtime as needed (For constipation.).    Historical Provider, MD  tamsulosin (FLOMAX) 0.4 MG CAPS capsule Take 1 capsule (0.4 mg total) by mouth daily. 11/30/14   Wanda Plump, MD    Family History Family History  Problem Relation Age of Onset  . Lupus Sister   . Stroke Brother 76    CVA, no FH premature CAD  . Diabetes Other     granddaughter  . Prostate cancer Other     cousin, age 30  . CAD Mother     "enlarged heart"  . Colon cancer Neg Hx     Social History Social History    Substance Use Topics  . Smoking status: Former Smoker    Types: Cigarettes  . Smokeless tobacco: Never Used     Comment: used to smoke rarely  . Alcohol use 0.0 oz/week     Comment: rarely     Allergies   Beef-derived products; Pork-derived products; Vasotec; and Other   Review of Systems Review of Systems 10 systems reviewed and all are negative for acute change except as noted in the HPI.   Physical Exam Updated Vital Signs BP 146/65 (BP Location: Right Arm)   Pulse (!) 56   Temp 99.4 F (37.4 C) (Oral)   Resp 20   SpO2 97%   Physical Exam  Constitutional: He is oriented to person, place, and time. He appears well-developed and well-nourished. No distress.  Pleasant, in NAD  HENT:  Head: Normocephalic and atraumatic.  Eyes: Conjunctivae and EOM are normal. No scleral icterus.  Neck: Normal range of motion.  Pulmonary/Chest: Effort normal. No respiratory distress.  Respirations even and unlabored  Musculoskeletal: Normal range of motion.  Neurological: He is alert and oriented to person, place, and time.  Skin: Skin is warm and dry. No rash noted. He is not diaphoretic. No erythema. No pallor.  Psychiatric: He has a normal mood and affect. His behavior is normal.  Nursing note and vitals reviewed.    ED Treatments / Results  Labs (all labs ordered are listed, but only abnormal results are displayed) Labs Reviewed - No data to display  EKG  EKG Interpretation None       Radiology No results found.  Procedures Procedures (including critical care time)  Medications Ordered in ED Medications  atorvastatin (LIPITOR) tablet 40 mg (not administered)  memantine (NAMENDA) tablet 10 mg (not administered)     Initial Impression / Assessment and Plan / ED Course  I have reviewed the triage vital signs and the nursing notes.  Pertinent labs & imaging results that were available during my care of the patient were reviewed by me and considered in my  medical decision making (see chart for details).  Clinical Course  DIAGNOSTIC STUDIES:  Oxygen Saturation is 97% on RA, normal by my interpretation.    COORDINATION OF CARE:  8:17 PM  Discussed treatment plan with pt at bedside and pt agreed to plan.  10:41 PM Care coordinated with social work. Patient has been accepted to his assisted living facility. Will transfer by PTAR. Nightly meds given.  I personally performed the services described in this documentation, which was scribed in my presence. The recorded information has been reviewed and is accurate.   Final Clinical Impressions(s) / ED Diagnoses   Final diagnoses:  Encounter for person awaiting admission to adequate facility elsewhere    Vitals:   03/25/16 1938 03/25/16 2001  BP:  146/65  Pulse:  (!) 56  Resp:  20  Temp:  99.4 F (37.4 C)  TempSrc:  Oral  SpO2: 96% 97%    New Prescriptions New Prescriptions   No medications on file     Antony Madura, PA-C 03/25/16 2242    Donnetta Hutching, MD 03/26/16 [phone]

## 2016-03-25 NOTE — Clinical Social Work Note (Addendum)
MSW has met with patient and wife at length throughout today in regards to disposition.   Bed offer remains extended from [facility] however wife and family has declined and indicated that facility has low ratings. MSW encouraged pt's wife and family to tour facility again. Wife reported she would try to visit facility today and was not able to tour yesterday due to her daughter working late.   [facility] came to evaluate patient yesterday evening and reported facility would readmit patient if wife paid for sitters. Wife was present during evaluation and aware of need for sitters. Discharge summary and updates faxed to ALF for review in the event patient returns today, 9/19.   Wife stated she does not want to pay extra for sitters however understands this is the only way patient can return to ALF.   Wife has list of SNF's in the area with one bed offer and SNF's that have declined patient. Wife now understands barrier to placement which is dementia and pt wandering.   No further concerns reported at this time. MSW will continue to follow patient and family for continued support and to facilitate dc for today, 9/19. Wife aware of dc for today.    Glendon Axe, MSW [phone] 03/25/2016 12:51 PM

## 2016-03-25 NOTE — Progress Notes (Signed)
[facility] placement issue.

## 2016-03-25 NOTE — ED Notes (Signed)
Social worker at bedside.

## 2016-03-25 NOTE — Progress Notes (Signed)
Physical Therapy Treatment Patient Details Name: Christian RankJoseph N Dimock MRN: [id number] DOB: [birth year] Today's Date: 03/25/2016    History of Present Illness 82 y.o. male with medical history significant of Alzheimer's dementia with occasional behavioral issues, DM, HTN and hypothyroidism and admitted for syncopal episodes.  MRI brain / MRA brain + acute CVA in right centrum semiovale    PT Comments    Pt requiring min to mod assist for mobility at this time.  Pt pleasant and cooperative and able to progress ambulation distance today however does require assist due to occasional LOB.    Follow Up Recommendations  SNF     Equipment Recommendations  Other (comment) (TBD next venue)    Recommendations for Other Services       Precautions / Restrictions Precautions Precautions: Fall Restrictions Weight Bearing Restrictions: No    Mobility  Bed Mobility Overal bed mobility: Needs Assistance Bed Mobility: Supine to Sit     Supine to sit: Min assist;HOB elevated     General bed mobility comments: multimodal cues for technique, assist for trunk upright  Transfers Overall transfer level: Needs assistance Equipment used: 2 person hand held assist Transfers: Sit to/from Stand Sit to Stand: Min assist;+2 safety/equipment         General transfer comment: Assist to rise, stabilize, control descent. initial posterior bias with initial standing. Pt denied dizziness.   Ambulation/Gait Ambulation/Gait assistance: Min assist;+2 physical assistance;Mod assist Ambulation Distance (Feet): 160 Feet Assistive device: 2 person hand held assist       General Gait Details: provided 2 HHA for guiding/support, pt with good grip only used HHA for support with challenges to balance (looking around environment),  mod assist for 3 instances of LOB, pt not used to using any assistive device per spouse   Stairs            Wheelchair Mobility    Modified Rankin (Stroke Patients Only)        Balance           Standing balance support: Bilateral upper extremity supported Standing balance-Leahy Scale: Poor                      Cognition Arousal/Alertness: Awake/alert Behavior During Therapy: WFL for tasks assessed/performed Overall Cognitive Status: History of cognitive impairments - at baseline                      Exercises      General Comments        Pertinent Vitals/Pain Pain Assessment: Faces Faces Pain Scale: No hurt    Home Living                      Prior Function            PT Goals (current goals can now be found in the care plan section) Progress towards PT goals: Progressing toward goals    Frequency    Min 3X/week      PT Plan Current plan remains appropriate    Co-evaluation             End of Session Equipment Utilized During Treatment: Gait belt Activity Tolerance: Patient tolerated treatment well Patient left: with call bell/phone within reach;with family/visitor present;in bed;with bed alarm set     Time: [id number] PT Time Calculation (min) (ACUTE ONLY): 19 min  Charges:  $Gait Training: 8-22 mins  G Codes:      Tersea Aulds,KATHrine E April 05, 2016, 12:44 PM Zenovia Jarred, PT, DPT 2016-04-05 Pager: [phone]

## 2016-03-25 NOTE — Clinical Social Work Note (Signed)
Patient's wife and friend going to [facility].   MSW remains available as needed.   Derenda FennelBashira Everley Evora, MSW [phone] 03/25/2016 2:43 PM

## 2016-03-25 NOTE — Clinical Social Work Note (Addendum)
UPDATE: Wife was scheduled to visit/tour facility around 2pm however has not arrived at this time. MSW placed call to wife and she is agreeable to [facility] and with PTAR transportation being arranged at 4:30pm. Wife understands that [facility] CANNOT [facility]. Wife reported if she is dissatisfied with facility she will have patient transitioned to [facility] on tomorrow, 9/20. Wife understands she WILL have to pay privately for sitters if patient is to [facility] at this time. No further concerns reported at this time.    Medical Social Worker facilitated patient discharge including contacting patient family and facility to confirm patient discharge plans.  Clinical information faxed to facility and family agreeable with plan.  MSW arranged ambulance transport via PT[facility] to [facility].  RN to call report prior to discharge [phone].  Medical Social Worker will sign off for now as social work intervention is no longer needed. Please consult us again if new need arises.  Derenda FennelBashira Kullen Erickson, MSW [phone] 03/25/2016 4:06 PM

## 2016-03-25 NOTE — Progress Notes (Signed)
10:10PM UPDATE: Patient will be returning to [facility] this evening once patient receives his night time meds. [facility] is unable to provide night time medication due to time. CSW informed EDP and RN. Will continue to update.   CSW spoke with patients wife, [name], regarding discharge plans. Patients wife was very tearful and stated "there was no communication today". Patient was recently discharged earlier this evening and returned to the ED not long after. Patients wife is upset because she was misinformed regarding transportation/discharge.   CS[name] with [facility] and was informed that patients wife did not consent to transportation or SNF bed offer.   CSW discussed patient with ChiropodistAssistant Director of Social Work and EDP and patient will go to [facility] in the morning.   Stacy GardnerErin Kani Chauvin, LCSWA Clinical Social Worker [phone]

## 2016-03-25 NOTE — Clinical Social Work Placement (Signed)
   CLINICAL SOCIAL WORK PLACEMENT  NOTE  Date:  03/25/2016  Patient Details  Name: Christian Erickson MRN: [id number] Date of Birth: [birth year]  Clinical Social Work is seeking post-discharge placement for this patient at the Skilled  Nursing Facility level of care (*CSW will initial, date and re-position this form in  chart as items are completed):  Yes   Patient/family provided with [place] Clinical Social Work Department's list of facilities offering this level of care within the geographic area requested by the patient (or if unable, by the patient's family).  Yes   Patient/family informed of their freedom to choose among providers that offer the needed level of care, that participate in Medicare, Medicaid or managed care program needed by the patient, have an available bed and are willing to accept the patient.  Yes   Patient/family informed of [facility] ownership interest in [facility] and [facility], as well as of the fact that they are under no obligation to receive care at these facilities.  PASRR submitted to EDS on 03/24/16     PASRR number received on       Existing PASRR number confirmed on       FL2 transmitted to all facilities in geographic area requested by pt/family on 03/24/16     FL2 transmitted to all facilities within larger geographic area on       Patient informed that his/her managed care company has contracts with or will negotiate with certain facilities, including the following:        Yes   Patient/family informed of bed offers received.  [facility]  [facility]     Physician recommends and patient chooses bed at      Patient to be transferred to  [facility] on 03/25/16.  Patient to be transferred to facility by  Sharin Mons(PTAR )     Patient family notified on 03/25/16 of transfer.  Name of family member notified:   (Pt's wife, Chales Salmonnthanette Hafford )     PHYSICIAN Please sign FL2, Please  prepare priority discharge summary, including medications     Additional Comment:    _______________________________________________ Derenda FennelNixon, Adelle Zachar A 03/25/2016, 4:05 PM

## 2016-03-26 LAB — VAS US UPPER EXTREMITY VENOUS DUPLEX
Ao-asc: 33 cm
CHL CUP MV DEC (S): 507
E decel time: 507 msec
E/e' ratio: 16.04
FS: 38 % (ref 28–44)
IV/PV OW: 0.99
LA ID, A-P, ES: 41 mm
LA diam index: 2.17 cm/m2
LA vol A4C: 41.6 ml
LA vol index: 28.4 mL/m2
LAVOL: 53.5 mL
LDCA: 2.84 cm2
LEFT ATRIUM END SYS DIAM: 41 mm
LV E/e'average: 16.04
LV TDI E'MEDIAL: 6.2
LVEEMED: 16.04
LVELAT: 6.42 cm/s
LVOT SV: 59 mL
LVOT VTI: 20.8 cm
LVOTD: 19 mm
LVOTPV: 93.1 cm/s
Lateral S' vel: 8.05 cm/s
MV pk E vel: 103 m/s
MVAP: 1.38 cm2
MVPG: 4 mmHg
MVPKAVEL: 128 m/s
P 1/2 time: 157 ms
PW: 16 mm — AB (ref 0.6–1.1)
TAPSE: 18.2 mm
TDI e' lateral: 6.42

## 2016-03-26 NOTE — Clinical Social Work Note (Signed)
LATE ENTRY:  MS[facility] on today, 9/20 indicating that facility never received discharge paperwork (FL-2). MSW has faxed FL-2 as requested.  MSW has also reviewed chart and noted that EDCSW faxed FL-2 and discharge summary to facility around 11PM on 9/19.  MSW was also contacted at home from 8PM-11PM on the night of 9/19 regarding patient being admitted to ED by EDCSW. MSW informed EDCSW on events of case from earlier in the day and provided support. MSW also shared information from last note from this MSW filed at 4:37PM, where [facility] consented [facility] and for PTAR transportation being arranged. This conversation was witnessed by another CSW as well as [facility]. Facility was aware of PTAR being arranged as well. Please refer to note filed at 4:37PM for further details.  EDCSW was not notified by bedside RN before patient was transported back to [facility]. MSW notified by EDCSW that [facility] where wife is to provide 24 hr sitters.   Case discussed with CSW Interior and spatial designerDirector, CSW ChiropodistAssistant Director and [facility].   Christian FennelBashira Leyla Erickson, MSW [phone] 03/26/2016 12:20 PM

## 2016-06-10 ENCOUNTER — Emergency Department (HOSPITAL_COMMUNITY): Payer: Medicare Other

## 2016-06-10 ENCOUNTER — Emergency Department (HOSPITAL_COMMUNITY)
Admission: EM | Admit: 2016-06-10 | Discharge: 2016-06-10 | Disposition: A | Discharge status: Home / Self Care | Payer: Medicare Other | Attending: Emergency Medicine | Admitting: Emergency Medicine

## 2016-06-10 ENCOUNTER — Encounter (HOSPITAL_COMMUNITY): Payer: Self-pay

## 2016-06-10 DIAGNOSIS — Y999 Unspecified external cause status: Secondary | ICD-10-CM | POA: Insufficient documentation

## 2016-06-10 DIAGNOSIS — E119 Type 2 diabetes mellitus without complications: Secondary | ICD-10-CM | POA: Insufficient documentation

## 2016-06-10 DIAGNOSIS — Y939 Activity, unspecified: Secondary | ICD-10-CM | POA: Diagnosis not present

## 2016-06-10 DIAGNOSIS — W06XXXA Fall from bed, initial encounter: Secondary | ICD-10-CM | POA: Insufficient documentation

## 2016-06-10 DIAGNOSIS — Z7984 Long term (current) use of oral hypoglycemic drugs: Secondary | ICD-10-CM | POA: Diagnosis not present

## 2016-06-10 DIAGNOSIS — Y92009 Unspecified place in unspecified non-institutional (private) residence as the place of occurrence of the external cause: Secondary | ICD-10-CM | POA: Insufficient documentation

## 2016-06-10 DIAGNOSIS — Z8673 Personal history of transient ischemic attack (TIA), and cerebral infarction without residual deficits: Secondary | ICD-10-CM | POA: Insufficient documentation

## 2016-06-10 DIAGNOSIS — E039 Hypothyroidism, unspecified: Secondary | ICD-10-CM | POA: Insufficient documentation

## 2016-06-10 DIAGNOSIS — Z96651 Presence of right artificial knee joint: Secondary | ICD-10-CM | POA: Insufficient documentation

## 2016-06-10 DIAGNOSIS — Z7982 Long term (current) use of aspirin: Secondary | ICD-10-CM | POA: Diagnosis not present

## 2016-06-10 DIAGNOSIS — S0990XA Unspecified injury of head, initial encounter: Secondary | ICD-10-CM | POA: Diagnosis not present

## 2016-06-10 DIAGNOSIS — F0281 Dementia in other diseases classified elsewhere with behavioral disturbance: Secondary | ICD-10-CM | POA: Diagnosis not present

## 2016-06-10 DIAGNOSIS — Z79899 Other long term (current) drug therapy: Secondary | ICD-10-CM | POA: Diagnosis not present

## 2016-06-10 DIAGNOSIS — I10 Essential (primary) hypertension: Secondary | ICD-10-CM | POA: Insufficient documentation

## 2016-06-10 DIAGNOSIS — Z87891 Personal history of nicotine dependence: Secondary | ICD-10-CM | POA: Insufficient documentation

## 2016-06-10 DIAGNOSIS — G309 Alzheimer's disease, unspecified: Secondary | ICD-10-CM | POA: Insufficient documentation

## 2016-06-10 DIAGNOSIS — W19XXXA Unspecified fall, initial encounter: Secondary | ICD-10-CM

## 2016-06-10 LAB — I-STAT CHEM 8, ED
BUN: 35 mg/dL — ABNORMAL HIGH (ref 6–20)
Calcium, Ion: 1.19 mmol/L (ref 1.15–1.40)
Chloride: 100 mmol/L — ABNORMAL LOW (ref 101–111)
Creatinine, Ser: 1.3 mg/dL — ABNORMAL HIGH (ref 0.61–1.24)
GLUCOSE: 79 mg/dL (ref 65–99)
HCT: 42 % (ref 39.0–52.0)
HEMOGLOBIN: 14.3 g/dL (ref 13.0–17.0)
POTASSIUM: 4.1 mmol/L (ref 3.5–5.1)
Sodium: 139 mmol/L (ref 135–145)
TCO2: 30 mmol/L (ref 0–100)

## 2016-06-10 LAB — URINALYSIS, ROUTINE W REFLEX MICROSCOPIC
BACTERIA UA: NONE SEEN
BILIRUBIN URINE: NEGATIVE
GLUCOSE, UA: NEGATIVE mg/dL
Hgb urine dipstick: NEGATIVE
KETONES UR: NEGATIVE mg/dL
NITRITE: NEGATIVE
PROTEIN: NEGATIVE mg/dL
SQUAMOUS EPITHELIAL / LPF: NONE SEEN
Specific Gravity, Urine: 1.015 (ref 1.005–1.030)
pH: 5 (ref 5.0–8.0)

## 2016-06-10 NOTE — ED Notes (Signed)
Departure condition and last vitals charted by this nurse in error.

## 2016-06-10 NOTE — ED Notes (Signed)
PTAR arrived.  

## 2016-06-10 NOTE — ED Notes (Signed)
Pt stable, [facility] notified, [facility] called, family at bedside.

## 2016-06-10 NOTE — ED Triage Notes (Signed)
Pt presents for evaluation of unwitnessed fall today. Pt. [facility]. Pt. AxO to person and situation. Pt. On plavix, denies LOC. States hit head, no obvious injury or complaint per EMS.

## 2016-06-10 NOTE — ED Provider Notes (Signed)
[facility]Y [facility] Provider Note   CSN: [id number] Arrival date & time: 06/10/16  1204     History   Chief Complaint Chief Complaint  Patient presents with  . Fall    HPI Christian Erickson is a 82 y.o. male.  82 yo M with a cc of a fall out of bed.  Hit his right side. Denies pain.  Denies symptom, sent by his nursing home.    The history is provided by the patient.  Fall  This is a new problem. The current episode started less than 1 hour ago. The problem occurs constantly. The problem has not changed since onset.Pertinent negatives include no chest pain, no abdominal pain, no headaches and no shortness of breath. Nothing aggravates the symptoms. Nothing relieves the symptoms. He has tried nothing for the symptoms. The treatment provided no relief.    Past Medical History:  Diagnosis Date  . Allergic rhinitis   . Alzheimer's dementia   . Arthritis   . Blood dyscrasia   . BPH (benign prostatic hypertrophy)    nocturia  . Diabetes mellitus   . Diverticulitis   . ED (erectile dysfunction)   . Glaucoma    sees eye doctor routinely  . Hyperlipidemia   . Hypertension   . Subclinical hypothyroidism     Patient Active Problem List   Diagnosis Date Noted  . CVA (cerebral infarction) 03/22/2016  . Syncope 03/20/2016  . Bradycardia 03/20/2016  . PCP NOTES >>>> 05/21/2015  . Mixed Alzheimer's and vascular dementia 10/04/2013  . Stroke (HCC) 07/23/2013  . Acute CVA (cerebrovascular accident) (HCC) 07/23/2013  . Loss of weight 12/11/2012  . Memory difficulty 05/26/2012  . Hypothyroidism 01/20/2012  . Medicare annual wellness visit, subsequent 01/06/2012  . Edema 01/06/2012  . BPH (benign prostatic hyperplasia) 09/05/2011  . DJD (degenerative joint disease) 06/24/2011  . ALLERGIC RHINITIS 09/10/2010  . BACK PAIN 08/21/2009  . DMII (diabetes mellitus, type 2) (HCC) 05/25/2009  . THROMBOCYTOPENIA 08/30/2008  . GLAUCOMA 08/30/2008  . ERECTILE DYSFUNCTION 01/25/2007    . Dyslipidemia 12/15/2006  . Essential hypertension 12/15/2006  . DIVERTICULITIS, HX OF 12/15/2006    Past Surgical History:  Procedure Laterality Date  . COLECTOMY     w/ reversal due to divertiuclitis per patient in 1990s aprox  . INGUINAL HERNIA REPAIR    . sbo repair    . TOTAL KNEE ARTHROPLASTY Right 10/27/2012   Procedure: TOTAL KNEE ARTHROPLASTY;  Surgeon: Loreta Aveaniel F Murphy, MD;  [facility];  Service: Orthopedics;  Laterality: Right;       Home Medications    Prior to Admission medications   Medication Sig Start Date End Date Taking? Authorizing Provider  amLODipine (NORVASC) 5 MG tablet Take 1 tablet (5 mg total) by mouth daily. 03/25/16  Yes Alison MurrayAlma M Devine, MD  aspirin EC 81 MG tablet Take 1 tablet (81 mg total) by mouth daily. 03/24/16  Yes Alison MurrayAlma M Devine, MD  atorvastatin (LIPITOR) 40 MG tablet Take 1 tablet (40 mg total) by mouth daily at 6 PM. 06/13/15  Yes Wanda PlumpJose E Paz, MD  bimatoprost (LUMIGAN) 0.01 % SOLN Place 1 drop into both eyes at bedtime.   Yes Historical Provider, MD  citalopram (CELEXA) 10 MG tablet Take 1 tablet (10 mg total) by mouth daily. 03/25/16  Yes Alison MurrayAlma M Devine, MD  clopidogrel (PLAVIX) 75 MG tablet Take 1 tablet (75 mg total) by mouth daily with breakfast. Patient taking differently: Take 75 mg by mouth daily.  10/03/14  Yes Wanda Plump, MD  fluticasone [facility]) 50 MCG/ACT nasal spray Place 2 sprays into both nostrils daily as needed for allergies or rhinitis. 01/15/15  Yes Wanda Plump, MD  glipiZIDE (GLUCOTROL) 10 MG tablet Take 10 mg by mouth daily. 03/17/16  Yes Historical Provider, MD  hydrochlorothiazide (HYDRODIURIL) 25 MG tablet Take 25 mg by mouth daily.   Yes Historical Provider, MD  levothyroxine (SYNTHROID, LEVOTHROID) 25 MCG tablet Take 1 tablet (25 mcg total) by mouth daily before breakfast. 06/12/15  Yes Wanda Plump, MD  Multiple Vitamin (MULTIVITAMIN WITH MINERALS) TABS tablet Take 1 tablet by mouth daily. 07/27/13  Yes Joshiah Art, DO   NAMENDA 10 MG tablet TAKE 1 TABLET TWICE A DAY 11/24/14  Yes Drema Dallas, DO  Nutritional Supplements (ENSURE PO) Take 237 mLs by mouth daily.   Yes Historical Provider, MD  saxagliptin HCl (ONGLYZA) 5 MG TABS tablet Take 1 tablet (5 mg total) by mouth daily. 04/18/15  Yes Wanda Plump, MD  tamsulosin (FLOMAX) 0.4 MG CAPS capsule Take 1 capsule (0.4 mg total) by mouth daily. 11/30/14  Yes Wanda Plump, MD  acetaminophen (MAPAP) 500 MG tablet Take 1,000 mg by mouth every 6 (six) hours as needed (For pain.).    Historical Provider, MD  EPINEPHrine (EPIPEN 2-PAK) 0.3 mg/0.3 mL IJ SOAJ injection Inject 0.3 mLs (0.3 mg total) into the muscle once. Patient taking differently: Inject 0.3 mg into the muscle as needed (For anaphylaxis.).  01/15/15   Wanda Plump, MD  Loperamide HCl (IMODIUM A-D) 1 MG/7.5ML LIQD Take 30 mLs by mouth 3 (three) times daily as needed (For loose stool.).     Historical Provider, MD  senna-docusate (SENEXON-S) 8.6-50 MG tablet Take 1 tablet by mouth at bedtime as needed (For constipation.).    Historical Provider, MD    Family History Family History  Problem Relation Age of Onset  . Lupus Sister   . Stroke Brother 82    CVA, no FH premature CAD  . Diabetes Other     granddaughter  . Prostate cancer Other     cousin, age 2  . CAD Mother     "enlarged heart"  . Colon cancer Neg Hx     Social History Social History  Substance Use Topics  . Smoking status: Former Smoker    Types: Cigarettes  . Smokeless tobacco: Never Used     Comment: used to smoke rarely  . Alcohol use 0.0 oz/week     Comment: rarely     Allergies   Beef-derived products; Pork-derived products; Vasotec; and Other   Review of Systems Review of Systems  Constitutional: Negative for chills and fever.  HENT: Negative for congestion and facial swelling.   Eyes: Negative for discharge and visual disturbance.  Respiratory: Negative for shortness of breath.   Cardiovascular: Negative for chest pain  and palpitations.  Gastrointestinal: Negative for abdominal pain, diarrhea and vomiting.  Musculoskeletal: Negative for arthralgias and myalgias.  Skin: Negative for color change and rash.  Neurological: Negative for tremors, syncope and headaches.  Psychiatric/Behavioral: Negative for confusion and dysphoric mood.     Physical Exam Updated Vital Signs BP 153/75   Pulse 70   Temp 98.2 F (36.8 C) (Oral)   Resp 13   Wt 162 lb (73.5 kg)   SpO2 100%   BMI 24.63 kg/m   Physical Exam  Constitutional: He is oriented to person, place, and time. He appears well-developed and well-nourished.  HENT:  Head: Normocephalic and atraumatic.  Eyes: EOM are normal. Pupils are equal, round, and reactive to light.  Neck: Normal range of motion. Neck supple. No JVD present.  Cardiovascular: Normal rate and regular rhythm.  Exam reveals no gallop and no friction rub.   No murmur heard. Pulmonary/Chest: No respiratory distress. He has no wheezes.  Abdominal: He exhibits no distension. There is no rebound and no guarding.  Musculoskeletal: Normal range of motion.  Neurological: He is alert and oriented to person, place, and time.  Skin: No rash noted. No pallor.  Psychiatric: He has a normal mood and affect. His behavior is normal.  Nursing note and vitals reviewed.    ED Treatments / Results  Labs (all labs ordered are listed, but only abnormal results are displayed) Labs Reviewed  URINALYSIS, ROUTINE W REFLEX MICROSCOPIC - Abnormal; Notable for the following:       Result Value   Leukocytes, UA TRACE (*)    All other components within normal limits  I-STAT CHEM 8, ED - Abnormal; Notable for the following:    Chloride 100 (*)    BUN 35 (*)    Creatinine, Ser 1.30 (*)    All other components within normal limits    EKG  EKG Interpretation None       Radiology Ct Head Wo Contrast  Result Date: 06/10/2016 CLINICAL DATA:  Unwitnessed fall today. EXAM: CT HEAD WITHOUT CONTRAST  TECHNIQUE: Contiguous axial images were obtained from the base of the skull through the vertex without intravenous contrast. COMPARISON:  03/19/2016 FINDINGS: Brain: Generalized atrophy. Chronic small-vessel ischemic changes throughout the cerebral hemispheric white matter. Old lacunar infarction left thalamus. No sign of acute infarction, mass lesion, hemorrhage, hydrocephalus or extra-axial collection. Vascular: There is atherosclerotic calcification of the major vessels at the base of the brain. Skull: No skull fracture. Sinuses/Orbits: Chronic opacification of the left frontal sinus. Orbits negative. Other: None significant IMPRESSION: No acute or traumatic finding. Atrophy and chronic small-vessel ischemic changes. Electronically Signed   By: Paulina Fusi M.D.   On: 06/10/2016 14:28    Procedures Procedures (including critical care time)  Medications Ordered in ED Medications - No data to display   Initial Impression / Assessment and Plan / ED Course  I have reviewed the triage vital signs and the nursing notes.  Pertinent labs & imaging results that were available during my care of the patient were reviewed by me and considered in my medical decision making (see chart for details).  Clinical Course     82 yo M with a fall out of bed.  On plavix, thinks he hit his head.  No noted area of tenderness on exam.  CT head.   Family arrived at bedside three hours later, they feel that the patient needs an MRI.  Also state that he was sent here by their doctor for a urine and lab eval for multiple falls and weakness.  Discussed reasons for emergent MRI, will obtain labs.   Labs and ua negative.  D/c home.     I have discussed the diagnosis/risks/treatment options with the patient and family and believe the pt to be eligible for discharge home to follow-up with PCP. We also discussed returning to the ED immediately if new or worsening sx occur. We discussed the sx which are most concerning (e.g.,  sudden worsening pain, fever, inability to tolerate by mouth) that necessitate immediate return. Medications administered to the patient during their visit and any new prescriptions provided to  the patient are listed below.  Medications given during this visit Medications - No data to display   The patient appears reasonably screen and/or stabilized for discharge and I doubt any other medical condition or other [facility] requiring further screening, evaluation, or treatment in the ED at this time prior to discharge.      Final Clinical Impressions(s) / ED Diagnoses   Final diagnoses:  Fall in home, initial encounter    New Prescriptions Discharge Medication List as of 06/10/2016  2:34 PM       Melene Planan Karington Zarazua, DO 06/12/16 [phone]

## 2016-06-10 NOTE — ED Notes (Signed)
Called [facility] to advise pt is being transported back with [facility]

## 2016-07-02 ENCOUNTER — Ambulatory Visit: Payer: Medicare Other | Admitting: Neurology

## 2016-10-07 ENCOUNTER — Ambulatory Visit: Payer: Medicare Other | Admitting: Neurology

## 2016-10-07 DIAGNOSIS — Z029 Encounter for administrative examinations, unspecified: Secondary | ICD-10-CM

## 2016-10-08 ENCOUNTER — Encounter: Payer: Self-pay | Admitting: Neurology

## 2016-11-25 ENCOUNTER — Observation Stay (HOSPITAL_COMMUNITY): Payer: Medicare Other

## 2016-11-25 ENCOUNTER — Emergency Department (HOSPITAL_COMMUNITY): Payer: Medicare Other

## 2016-11-25 ENCOUNTER — Observation Stay (HOSPITAL_COMMUNITY)
Admission: EM | Admit: 2016-11-25 | Discharge: 2016-11-26 | Disposition: A | Discharge status: Home / Self Care | Payer: Medicare Other | Attending: Internal Medicine | Admitting: Internal Medicine

## 2016-11-25 ENCOUNTER — Encounter (HOSPITAL_COMMUNITY): Payer: Self-pay | Admitting: Emergency Medicine

## 2016-11-25 DIAGNOSIS — Z87891 Personal history of nicotine dependence: Secondary | ICD-10-CM | POA: Diagnosis not present

## 2016-11-25 DIAGNOSIS — Z7902 Long term (current) use of antithrombotics/antiplatelets: Secondary | ICD-10-CM | POA: Insufficient documentation

## 2016-11-25 DIAGNOSIS — R55 Syncope and collapse: Principal | ICD-10-CM | POA: Diagnosis present

## 2016-11-25 DIAGNOSIS — E785 Hyperlipidemia, unspecified: Secondary | ICD-10-CM | POA: Diagnosis present

## 2016-11-25 DIAGNOSIS — I2510 Atherosclerotic heart disease of native coronary artery without angina pectoris: Secondary | ICD-10-CM | POA: Insufficient documentation

## 2016-11-25 DIAGNOSIS — G309 Alzheimer's disease, unspecified: Secondary | ICD-10-CM | POA: Insufficient documentation

## 2016-11-25 DIAGNOSIS — Z8673 Personal history of transient ischemic attack (TIA), and cerebral infarction without residual deficits: Secondary | ICD-10-CM | POA: Diagnosis not present

## 2016-11-25 DIAGNOSIS — I639 Cerebral infarction, unspecified: Secondary | ICD-10-CM

## 2016-11-25 DIAGNOSIS — F0280 Dementia in other diseases classified elsewhere without behavioral disturbance: Secondary | ICD-10-CM | POA: Diagnosis present

## 2016-11-25 DIAGNOSIS — Z96651 Presence of right artificial knee joint: Secondary | ICD-10-CM | POA: Insufficient documentation

## 2016-11-25 DIAGNOSIS — E039 Hypothyroidism, unspecified: Secondary | ICD-10-CM | POA: Insufficient documentation

## 2016-11-25 DIAGNOSIS — I10 Essential (primary) hypertension: Secondary | ICD-10-CM | POA: Diagnosis present

## 2016-11-25 DIAGNOSIS — Z7984 Long term (current) use of oral hypoglycemic drugs: Secondary | ICD-10-CM | POA: Diagnosis not present

## 2016-11-25 DIAGNOSIS — Z7982 Long term (current) use of aspirin: Secondary | ICD-10-CM | POA: Diagnosis not present

## 2016-11-25 DIAGNOSIS — E119 Type 2 diabetes mellitus without complications: Secondary | ICD-10-CM

## 2016-11-25 DIAGNOSIS — F0150 Vascular dementia without behavioral disturbance: Secondary | ICD-10-CM | POA: Diagnosis present

## 2016-11-25 HISTORY — DX: Cerebral infarction, unspecified: I63.9

## 2016-11-25 LAB — CBC WITH DIFFERENTIAL/PLATELET
Basophils Absolute: 0 10*3/uL (ref 0.0–0.1)
Basophils Relative: 0 %
EOS ABS: 0.2 10*3/uL (ref 0.0–0.7)
EOS PCT: 2 %
HCT: 39.6 % (ref 39.0–52.0)
Hemoglobin: 13.5 g/dL (ref 13.0–17.0)
LYMPHS ABS: 1.1 10*3/uL (ref 0.7–4.0)
LYMPHS PCT: 15 %
MCH: 29.6 pg (ref 26.0–34.0)
MCHC: 34.1 g/dL (ref 30.0–36.0)
MCV: 86.8 fL (ref 78.0–100.0)
MONO ABS: 0.5 10*3/uL (ref 0.1–1.0)
Monocytes Relative: 7 %
Neutro Abs: 5.5 10*3/uL (ref 1.7–7.7)
Neutrophils Relative %: 76 %
PLATELETS: 110 10*3/uL — AB (ref 150–400)
RBC: 4.56 MIL/uL (ref 4.22–5.81)
RDW: 14.9 % (ref 11.5–15.5)
WBC: 7.2 10*3/uL (ref 4.0–10.5)

## 2016-11-25 LAB — COMPREHENSIVE METABOLIC PANEL
ALT: 24 U/L (ref 17–63)
ANION GAP: 8 (ref 5–15)
AST: 24 U/L (ref 15–41)
Albumin: 3.2 g/dL — ABNORMAL LOW (ref 3.5–5.0)
Alkaline Phosphatase: 87 U/L (ref 38–126)
BUN: 23 mg/dL — ABNORMAL HIGH (ref 6–20)
CHLORIDE: 99 mmol/L — AB (ref 101–111)
CO2: 26 mmol/L (ref 22–32)
Calcium: 8.7 mg/dL — ABNORMAL LOW (ref 8.9–10.3)
Creatinine, Ser: 1.28 mg/dL — ABNORMAL HIGH (ref 0.61–1.24)
GFR, EST AFRICAN AMERICAN: 58 mL/min — AB (ref >60)
GFR, EST NON AFRICAN AMERICAN: 50 mL/min — AB (ref >60)
Glucose, Bld: 197 mg/dL — ABNORMAL HIGH (ref 65–99)
POTASSIUM: 3.8 mmol/L (ref 3.5–5.1)
Sodium: 133 mmol/L — ABNORMAL LOW (ref 135–145)
Total Bilirubin: 1.1 mg/dL (ref 0.3–1.2)
Total Protein: 7 g/dL (ref 6.5–8.1)

## 2016-11-25 LAB — I-STAT TROPONIN, ED: TROPONIN I, POC: 0 ng/mL (ref 0.00–0.08)

## 2016-11-25 LAB — MAGNESIUM: MAGNESIUM: 1.9 mg/dL (ref 1.7–2.4)

## 2016-11-25 MED ORDER — LEVOTHYROXINE SODIUM 25 MCG PO TABS
25.0000 ug | ORAL_TABLET | Freq: Every day | ORAL | Status: DC
  Administered 2016-11-26: 25 ug via ORAL
  Filled 2016-11-25: qty 1

## 2016-11-25 MED ORDER — ASPIRIN EC 81 MG PO TBEC
81.0000 mg | DELAYED_RELEASE_TABLET | Freq: Every day | ORAL | Status: DC
  Administered 2016-11-26: 81 mg via ORAL
  Filled 2016-11-25: qty 1

## 2016-11-25 MED ORDER — CITALOPRAM HYDROBROMIDE 10 MG PO TABS
10.0000 mg | ORAL_TABLET | Freq: Every day | ORAL | Status: DC
  Administered 2016-11-26: 10 mg via ORAL
  Filled 2016-11-25: qty 1

## 2016-11-25 MED ORDER — FLUTICASONE PROPIONATE 50 MCG/ACT NA SUSP: 2.0000 | Freq: Every day | NASAL | Status: DC | PRN

## 2016-11-25 MED ORDER — CLOPIDOGREL BISULFATE 75 MG PO TABS
75.0000 mg | ORAL_TABLET | Freq: Every day | ORAL | Status: DC
  Administered 2016-11-26: 75 mg via ORAL
  Filled 2016-11-25: qty 1

## 2016-11-25 MED ORDER — SODIUM CHLORIDE 0.9 % IV SOLN
INTRAVENOUS | Status: DC
  Administered 2016-11-25: 20:00:00 via INTRAVENOUS

## 2016-11-25 MED ORDER — MEMANTINE HCL 5 MG PO TABS
5.0000 mg | ORAL_TABLET | Freq: Two times a day (BID) | ORAL | Status: DC
  Administered 2016-11-25 – 2016-11-26 (×2): 5 mg via ORAL
  Filled 2016-11-25 (×2): qty 1

## 2016-11-25 MED ORDER — SODIUM CHLORIDE 0.9% FLUSH
3.0000 mL | Freq: Two times a day (BID) | INTRAVENOUS | Status: DC
  Administered 2016-11-25 – 2016-11-26 (×2): 3 mL via INTRAVENOUS

## 2016-11-25 MED ORDER — ENOXAPARIN SODIUM 40 MG/0.4ML ~~LOC~~ SOLN
40.0000 mg | SUBCUTANEOUS | Status: DC
  Administered 2016-11-25: 40 mg via SUBCUTANEOUS
  Filled 2016-11-25: qty 0.4

## 2016-11-25 MED ORDER — SENNOSIDES-DOCUSATE SODIUM 8.6-50 MG PO TABS: 1.0000 | ORAL_TABLET | Freq: Every evening | ORAL | Status: DC | PRN

## 2016-11-25 NOTE — ED Triage Notes (Signed)
Pt in from memory care unit at SNF [facility] after syncopal episode in their dining room. Per EMS, pt was sitting in dining room and had 2 min LOC/syncope. For EMS, VS 120/70 and CGB 150. Arrives to ED alert, hx of dementia but a&ox3-4.

## 2016-11-25 NOTE — H&P (Signed)
History and Physical    Christian Erickson NGE:[id number] DOB: 1931/01/01 DOA: 11/25/2016  PCP: System, Pcp Not In   Patient coming from: assisted living -memory care facility I have personally briefly reviewed patient's old medical records in [facility]  Chief Complaint:  syncope   HPI:  58 ? 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  Was seated at a chair in the dining room.  His history is not entirely reliable as he cannot tell me what happened to him bringing him here and tells me he was feeling woozy.  Says his vision was not clear.  Does not recall falling out at dining table Called [facility] memory unit-cannot recieve RN  On phone at facility   Wife states he has been falling more frequently for the past 1-2 mo which coincides with him stopping PT.  He still gets OT.  He is able to feed himself and is somewhat strong, but in the past mo has needed more assistance gettign up and has physically declined.  Appetite good per wife, has [place] without [place].  Has not had any other constitutional deficit recently   ED Course: Sodium 133 bun /creat 23/1.28, LFT wnl, POC troponin wnl EKG nsr brady no ST T wave changes cxr no cardiopulm process  Review of Systems: As per HPI otherwise 10 point review of systems negative.   Acceptable ROS statements: "All others negative  Past Medical History:  Diagnosis Date  . Allergic rhinitis   . Alzheimer's dementia   . Arthritis   . Blood dyscrasia   . BPH (benign prostatic hypertrophy)    nocturia  . Diabetes mellitus   . Diverticulitis   . ED (erectile dysfunction)   . Glaucoma    sees eye  doctor routinely  . Hyperlipidemia   . Hypertension   . Subclinical hypothyroidism     Past Surgical History:  Procedure Laterality Date  . COLECTOMY     w/ reversal due to divertiuclitis per patient in 1990s aprox  . INGUINAL HERNIA REPAIR    . sbo repair    . TOTAL KNEE ARTHROPLASTY Right 10/27/2012   Procedure: TOTAL KNEE ARTHROPLASTY;  Surgeon: Loreta Ave, MD;  Location: [facility] OR;  Service: Orthopedics;  Laterality: Right;     reports that he has quit smoking. His smoking use included Cigarettes. He has never used smokeless tobacco. He reports that he drinks alcohol. He reports that he does not use drugs.  Allergies  Allergen Reactions  . Beef-Derived Products Swelling  . Pork-Derived Products Swelling  . Vasotec Other (See Comments)    Angioedema, see OV 09-05-11  . Other Swelling and Other (See Comments)    veal    Family History  Problem Relation Age of Onset  . Lupus Sister   . Stroke Brother 50       CVA, no FH premature CAD  . Diabetes Other        granddaughter  . Prostate cancer Other        cousin, age 21  . CAD Mother        "enlarged heart"  . Colon cancer Neg Hx  Acceptable: Family history reviewed and not pertinent   Prior to Admission medications   Medication Sig Start Date End Date Taking? Authorizing Provider  acetaminophen (MAPAP) 500 MG tablet Take 1,000 mg by mouth every 6 (six) hours as needed (For pain.).   Yes [provider]  amLODipine (NORVASC) 5 MG tablet Take 1 tablet (5 mg total) by mouth daily. 03/25/16  Yes Alison Murray, MD  aspirin EC 81 MG tablet Take 1 tablet (81 mg total) by mouth daily. 03/24/16  Yes Alison Murray, MD  atorvastatin (LIPITOR) 40 MG tablet Take 1 tablet (40 mg total) by mouth daily at 6 PM. 06/13/15  Yes Paz, Elita Quick E, MD  bimatoprost (LUMIGAN) 0.01 % SOLN Place 1 drop into both eyes at bedtime.   Yes [provider]  citalopram (CELEXA) 10 MG tablet Take 1 tablet (10 mg total) by mouth daily.  03/25/16  Yes Alison Murray, MD  clopidogrel (PLAVIX) 75 MG tablet Take 1 tablet (75 mg total) by mouth daily with breakfast. Patient taking differently: Take 75 mg by mouth daily.  10/03/14  Yes Paz, Nolon Rod, MD  EPINEPHrine (EPIPEN 2-PAK) 0.3 mg/0.3 mL IJ SOAJ injection Inject 0.3 mLs (0.3 mg total) into the muscle once. Patient taking differently: Inject 0.3 mg into the muscle as needed (For anaphylaxis.).  01/15/15  Yes Paz, Nolon Rod, MD  glipiZIDE (GLUCOTROL) 10 MG tablet Take 10 mg by mouth daily. 03/17/16  Yes [provider]  hydrochlorothiazide (HYDRODIURIL) 25 MG tablet Take 25 mg by mouth daily.   Yes [provider]  levothyroxine (SYNTHROID, LEVOTHROID) 25 MCG tablet Take 1 tablet (25 mcg total) by mouth daily before breakfast. 06/12/15  Yes Paz, Nolon Rod, MD  Loperamide HCl (IMODIUM A-D) 1 MG/7.5ML LIQD Take 30 mLs by mouth 3 (three) times daily as needed (For loose stool.).    Yes [provider]  Multiple Vitamin (MULTIVITAMIN WITH MINERALS) TABS tablet Take 1 tablet by mouth daily. 07/27/13  Yes Arham Art, DO  NAMENDA 10 MG tablet TAKE 1 TABLET TWICE A DAY 11/24/14  Yes Jaffe, Adam R, DO  saxagliptin HCl (ONGLYZA) 5 MG TABS tablet Take 1 tablet (5 mg total) by mouth daily. 04/18/15  Yes Paz, Nolon Rod, MD  senna-docusate (SENEXON-S) 8.6-50 MG tablet Take 1 tablet by mouth at bedtime as needed (For constipation.).   Yes [provider]  tamsulosin (FLOMAX) 0.4 MG CAPS capsule Take 1 capsule (0.4 mg total) by mouth daily. Patient taking differently: Take 0.4 mg by mouth at bedtime.  11/30/14  Yes Paz, Nolon Rod, MD  fluticasone [facility]) 50 MCG/ACT nasal spray Place 2 sprays into both nostrils daily as needed for allergies or rhinitis. Patient not taking: Reported on 11/25/2016 01/15/15   Wanda Plump, MD    Physical Exam: Vitals:   11/25/16 1600 11/25/16 1615 11/25/16 1630 11/25/16 1645  BP: 122/68 (!) 127/59 132/67 121/65  Pulse: (!) 57 (!) 57 (!) 55 (!)  55  Resp: 16 13 16 13   SpO2: 100% 99% 98% 98%  Weight:        Constitutional: NAD, calm, comfortable Vitals:   11/25/16 1600 11/25/16 1615 11/25/16 1630 11/25/16 1645  BP: 122/68 (!) 127/59 132/67 121/65  Pulse: (!) 57 (!) 57 (!) 55 (!) 55  Resp: 16 13 16 13   SpO2: 100% 99% 98% 98%  Weight:       Eyes: PERRL, lids and conjunctivae normal, no signs of trauma to head ENMT: Mucous  membranes are moist. Posterior pharynx clear of any exudate or lesions. poordentition.  Neck: normal, supple, no masses, no thyromegaly, no bruiit Respiratory: clear to auscultation bilaterally, no wheezing, no crackles. Normal respiratory effort. No accessory muscle use.  Cardiovascular: Regular rate and rhythm, no murmurs / rubs / gallops. No extremity edema. 2+ pedal pulses. No carotid bruits.  Abdomen: no tenderness, no masses palpated. No hepatosplenomegaly. Bowel sounds positive.  Musculoskeletal: no clubbing / cyanosis. No joint deformity upper and lower extremities. Good ROM, no contractures. Normal muscle tone.  Skin: no rashes, lesions, ulcers. No induration Neurologic: CN 2-12 grossly intact. Sensation intact, DTR normal. Strength 5/5 in all 4.  Psychiatric: Normal judgment and insight. Alert and oriented x 3. Normal mood.   Labs on Admission: I have personally reviewed following labs and imaging studies  CBC:  Recent Labs Lab 11/25/16 1352  WBC 7.2  NEUTROABS 5.5  HGB 13.5  HCT 39.6  MCV 86.8  PLT 110*   Basic Metabolic Panel:  Recent Labs Lab 11/25/16 1352  NA 133*  K 3.8  CL 99*  CO2 26  GLUCOSE 197*  BUN 23*  CREATININE 1.28*  CALCIUM 8.7*  MG 1.9   GFR: CrCl cannot be calculated (Unknown ideal weight.). Liver Function Tests:  Recent Labs Lab 11/25/16 1352  AST 24  ALT 24  ALKPHOS 87  BILITOT 1.1  PROT 7.0  ALBUMIN 3.2*   No results for input(s): LIPASE, AMYLASE in the last 168 hours. No results for input(s): AMMONIA in the last 168 hours. Coagulation  Profile: No results for input(s): INR, PROTIME in the last 168 hours. Cardiac Enzymes: No results for input(s): CKTOTAL, CKMB, CKMBINDEX, TROPONINI in the last 168 hours. BNP (last 3 results) No results for input(s): PROBNP in the last 8760 hours. HbA1C: No results for input(s): HGBA1C in the last 72 hours. CBG: No results for input(s): GLUCAP in the last 168 hours. Lipid Profile: No results for input(s): CHOL, HDL, LDLCALC, TRIG, CHOLHDL, LDLDIRECT in the last 72 hours. Thyroid Function Tests: No results for input(s): TSH, T4TOTAL, FREET4, T3FREE, THYROIDAB in the last 72 hours. Anemia Panel: No results for input(s): VITAMINB12, FOLATE, FERRITIN, TIBC, IRON, RETICCTPCT in the last 72 hours. Urine analysis:    Component Value Date/Time   COLORURINE YELLOW 06/10/2016 1530   APPEARANCEUR CLEAR 06/10/2016 1530   LABSPEC 1.015 06/10/2016 1530   PHURINE 5.0 06/10/2016 1530   GLUCOSEU NEGATIVE 06/10/2016 1530   GLUCOSEU NEGATIVE 01/21/2013 1040   HGBUR NEGATIVE 06/10/2016 1530   HGBUR small 05/11/2008 1059   BILIRUBINUR NEGATIVE 06/10/2016 1530   BILIRUBINUR neg 09/05/2011 1300   KETONESUR NEGATIVE 06/10/2016 1530   PROTEINUR NEGATIVE 06/10/2016 1530   UROBILINOGEN 0.2 03/14/2014 0838   NITRITE NEGATIVE 06/10/2016 1530   LEUKOCYTESUR TRACE (A) 06/10/2016 1530    Radiological Exams on Admission: Dg Chest 2 View  Result Date: 11/25/2016 CLINICAL DATA:  83 y/o  M; syncope. EXAM: CHEST  2 VIEW COMPARISON:  03/19/2016 chest radiograph FINDINGS: Stable heart size and mediastinal contours are within normal limits. Both lungs are clear. Moderate degenerative changes of the thoracic spine. IMPRESSION: No active cardiopulmonary disease. Electronically Signed   By: Mitzi HansenLance  Furusawa-Stratton M.D.   On: 11/25/2016 14:35    EKG:  Assessment/Plan Active Problems:   * No active hospital problems. *  Syncope + collapse-ddx bradyarrythmia as is on NOrvasc, Namenda, flomax which can cause this  vs Orthostasis or de novo cva.   Getting CT head, orthostatics, stop Anti-htn agaents  and use prns as below Cutting back namenda to 5 mg bid Will follow up  NOTE-CVA order set NOT used--I have a very low suspicion for recurrent CVA given on asa and plavix and essentially nl exam  Htn-see above discussion-placed on hydralazine 5 q 6 prn bp > 160 sys.  Get orthostatics q 4 and document  Freq falls-Pt/Ot eval needed-appears to have been d/c from PT recentyl and then had decline  ckd II-III-hydrate with IVF 75 cc/h  DMty II-only check sugars-do not aggressively control.  Holding glipizide and saXAGLIPTIN  Will add SSI if sugars above 200 in am  hypothyroid-continue synthroid at 25 mcg dose.  Recheck levels 3-4 weeks if not done recentyl at SNF  Mild TCP-monitor with am labs  CVA-cont ASa and plavix for now.  See above discussion  BPH-probably needs 0.4 mg flomax.  monitor orthostatics prior to d/c     Family wants FULL CODE despite DNR order-discussed with wife to clarify with family and daughters obs on tele exepct d/c less than 24 hours   Rhetta Mura MD [facility] Pager [phone]  If 7PM-7AM, please contact night-coverage [email or web address] Password [facility]  11/25/2016, 5:16 PM

## 2016-11-25 NOTE — ED Provider Notes (Signed)
[facility]-EMERGENCY DEPT Provider Note   CSN: [id number] Arrival date & time: 11/25/16  1300     History   Chief Complaint Chief Complaint  Patient presents with  . Loss of Consciousness    HPI Christian Erickson is a 83 y.o. male.  HPI   Presents with concern for syncope.  Facility reports that he was sitting in the dining room, getting ready to have dinner, when he had an episode of syncope that lasted approximately 5 minutes. They reported he had a mild jerk, however no shaking or rhythmic activity consistent with seizure.   Denies chest pain, shortness of breath. Reports that he did feel lightheadedness and nausea prior to syncopal episode. Denies recent illness, including no fever, no urinary symptoms, no cough, no black or bloody stools. Reports he's had normal appetite, no nausea, vomiting or diarrhea. Her prior episode of syncope which was thought to be secondary to bradycardia from his beta blockers. Wife also reports concern that he's had syncopal episode, and was later diagnosed with stroke. On review of records, patient had left sided numbness at that time. Today, denies weakness, numbness, trouble speaking.  Past Medical History:  Diagnosis Date  . Allergic rhinitis   . Alzheimer's dementia   . Arthritis   . Blood dyscrasia   . BPH (benign prostatic hypertrophy)    nocturia  . Diabetes mellitus   . Diverticulitis   . ED (erectile dysfunction)   . Glaucoma    sees eye doctor routinely  . Hyperlipidemia   . Hypertension   . Subclinical hypothyroidism     Patient Active Problem List   Diagnosis Date Noted  . Syncope, cardiogenic 11/25/2016  . CVA (cerebral infarction) 03/22/2016  . Syncope 03/20/2016  . Bradycardia 03/20/2016  . PCP NOTES >>>> 05/21/2015  . Mixed Alzheimer's and vascular dementia 10/04/2013  . Stroke (HCC) 07/23/2013  . Acute CVA (cerebrovascular accident) (HCC) 07/23/2013  . Loss of weight 12/11/2012  . Memory difficulty 05/26/2012  .  Hypothyroidism 01/20/2012  . Medicare annual wellness visit, subsequent 01/06/2012  . Edema 01/06/2012  . BPH (benign prostatic hyperplasia) 09/05/2011  . DJD (degenerative joint disease) 06/24/2011  . ALLERGIC RHINITIS 09/10/2010  . BACK PAIN 08/21/2009  . DMII (diabetes mellitus, type 2) (HCC) 05/25/2009  . THROMBOCYTOPENIA 08/30/2008  . GLAUCOMA 08/30/2008  . ERECTILE DYSFUNCTION 01/25/2007  . Dyslipidemia 12/15/2006  . Essential hypertension 12/15/2006  . DIVERTICULITIS, HX OF 12/15/2006    Past Surgical History:  Procedure Laterality Date  . COLECTOMY     w/ reversal due to divertiuclitis per patient in 1990s aprox  . INGUINAL HERNIA REPAIR    . sbo repair    . TOTAL KNEE ARTHROPLASTY Right 10/27/2012   Procedure: TOTAL KNEE ARTHROPLASTY;  Surgeon: Loreta Ave, MD;  Location: [facility];  Service: Orthopedics;  Laterality: Right;       Home Medications    Prior to Admission medications   Medication Sig Start Date End Date Taking? Authorizing Provider  acetaminophen (MAPAP) 500 MG tablet Take 1,000 mg by mouth every 6 (six) hours as needed (For pain.).   Yes [provider]  amLODipine (NORVASC) 5 MG tablet Take 1 tablet (5 mg total) by mouth daily. 03/25/16  Yes Alison Murray, MD  aspirin EC 81 MG tablet Take 1 tablet (81 mg total) by mouth daily. 03/24/16  Yes Alison Murray, MD  atorvastatin (LIPITOR) 40 MG tablet Take 1 tablet (40 mg total) by mouth daily at 6 PM. 06/13/15  Yes Paz, Jose E, MD  bimatoprost (LUMIGAN) 0.01 % SOLN Place 1 drop into both eyes at bedtime.   Yes [provider]  citalopram (CELEXA) 10 MG tablet Take 1 tablet (10 mg total) by mouth daily. 03/25/16  Yes Alison Murray, MD  clopidogrel (PLAVIX) 75 MG tablet Take 1 tablet (75 mg total) by mouth daily with breakfast. Patient taking differently: Take 75 mg by mouth daily.  10/03/14  Yes Paz, Nolon Rod, MD  EPINEPHrine (EPIPEN 2-PAK) 0.3 mg/0.3 mL IJ SOAJ injection Inject 0.3 mLs (0.3 mg  total) into the muscle once. Patient taking differently: Inject 0.3 mg into the muscle as needed (For anaphylaxis.).  01/15/15  Yes Paz, Nolon Rod, MD  glipiZIDE (GLUCOTROL) 10 MG tablet Take 10 mg by mouth daily. 03/17/16  Yes [provider]  hydrochlorothiazide (HYDRODIURIL) 25 MG tablet Take 25 mg by mouth daily.   Yes [provider]  levothyroxine (SYNTHROID, LEVOTHROID) 25 MCG tablet Take 1 tablet (25 mcg total) by mouth daily before breakfast. 06/12/15  Yes Paz, Nolon Rod, MD  Loperamide HCl (IMODIUM A-D) 1 MG/7.5ML LIQD Take 30 mLs by mouth 3 (three) times daily as needed (For loose stool.).    Yes [provider]  Multiple Vitamin (MULTIVITAMIN WITH MINERALS) TABS tablet Take 1 tablet by mouth daily. 07/27/13  Yes Brok Art, DO  NAMENDA 10 MG tablet TAKE 1 TABLET TWICE A DAY 11/24/14  Yes Jaffe, Adam R, DO  saxagliptin HCl (ONGLYZA) 5 MG TABS tablet Take 1 tablet (5 mg total) by mouth daily. 04/18/15  Yes Paz, Nolon Rod, MD  senna-docusate (SENEXON-S) 8.6-50 MG tablet Take 1 tablet by mouth at bedtime as needed (For constipation.).   Yes [provider]  tamsulosin (FLOMAX) 0.4 MG CAPS capsule Take 1 capsule (0.4 mg total) by mouth daily. Patient taking differently: Take 0.4 mg by mouth at bedtime.  11/30/14  Yes Paz, Nolon Rod, MD  fluticasone [facility]) 50 MCG/ACT nasal spray Place 2 sprays into both nostrils daily as needed for allergies or rhinitis. Patient not taking: Reported on 11/25/2016 01/15/15   Wanda Plump, MD    Family History Family History  Problem Relation Age of Onset  . Lupus Sister   . Stroke Brother 70       CVA, no FH premature CAD  . Diabetes Other        granddaughter  . Prostate cancer Other        cousin, age 63  . CAD Mother        "enlarged heart"  . Colon cancer Neg Hx     Social History Social History  Substance Use Topics  . Smoking status: Former Smoker    Types: Cigarettes  . Smokeless tobacco: Never Used     Comment:  used to smoke rarely  . Alcohol use 0.0 oz/week     Comment: rarely     Allergies   Beef-derived products; Pork-derived products; Vasotec; and Other   Review of Systems Review of Systems  Constitutional: Negative for fever.  HENT: Negative for sore throat.   Eyes: Negative for visual disturbance.  Respiratory: Negative for shortness of breath.   Cardiovascular: Negative for chest pain.  Gastrointestinal: Positive for nausea (prior to syncope). Negative for abdominal pain, constipation, diarrhea and vomiting.  Genitourinary: Negative for difficulty urinating.  Musculoskeletal: Negative for back pain and neck stiffness.  Skin: Negative for rash.  Neurological: Positive for syncope and light-headedness. Negative for facial asymmetry, weakness, numbness  and headaches.     Physical Exam Updated Vital Signs BP (!) 156/76   Pulse 60   Resp 16   Wt 73.5 kg (162 lb)   SpO2 99%   BMI 24.63 kg/m   Physical Exam  Constitutional: He is oriented to person, place, and time. He appears well-developed and well-nourished. No distress.  HENT:  Head: Normocephalic and atraumatic.  Eyes: Conjunctivae and EOM are normal.  Neck: Normal range of motion.  Cardiovascular: Normal rate, regular rhythm, normal heart sounds and intact distal pulses.  Exam reveals no gallop and no friction rub.   No murmur heard. Pulmonary/Chest: Effort normal and breath sounds normal. No respiratory distress. He has no wheezes. He has no rales.  Abdominal: Soft. He exhibits no distension. There is no tenderness. There is no guarding.  Musculoskeletal: He exhibits no edema.  Neurological: He is alert and oriented to person, place, and time. He has normal strength. No cranial nerve deficit or sensory deficit. Coordination normal. GCS eye subscore is 4. GCS verbal subscore is 5. GCS motor subscore is 6.  Skin: Skin is warm and dry. He is not diaphoretic.  Nursing note and vitals reviewed.    ED Treatments /  Results  Labs (all labs ordered are listed, but only abnormal results are displayed) Labs Reviewed  CBC WITH DIFFERENTIAL/PLATELET - Abnormal; Notable for the following:       Result Value   Platelets 110 (*)    All other components within normal limits  COMPREHENSIVE METABOLIC PANEL - Abnormal; Notable for the following:    Sodium 133 (*)    Chloride 99 (*)    Glucose, Bld 197 (*)    BUN 23 (*)    Creatinine, Ser 1.28 (*)    Calcium 8.7 (*)    Albumin 3.2 (*)    GFR calc non Af Amer 50 (*)    GFR calc Af Amer 58 (*)    All other components within normal limits  MAGNESIUM  I-STAT TROPOININ, ED    EKG  EKG Interpretation  Date/Time:  Tuesday Nov 25 2016 13:11:53 EDT Ventricular Rate:  58 PR Interval:    QRS Duration: 101 QT Interval:  414 QTC Calculation: 407 R Axis:   -11 Text Interpretation:  Sinus rhythm Low voltage, precordial leads Abnormal R-wave progression, early transition No significant change since last tracing Confirmed by [facility] MD, Douglas Smolinsky ([id number]) on 11/25/2016 4:47:08 PM       Radiology Dg Chest 2 View  Result Date: 11/25/2016 CLINICAL DATA:  83 y/o  M; syncope. EXAM: CHEST  2 VIEW COMPARISON:  03/19/2016 chest radiograph FINDINGS: Stable heart size and mediastinal contours are within normal limits. Both lungs are clear. Moderate degenerative changes of the thoracic spine. IMPRESSION: No active cardiopulmonary disease. Electronically Signed   By: Mitzi HansenLance  Furusawa-Stratton M.D.   On: 11/25/2016 14:35   Ct Head Wo Contrast  Result Date: 11/25/2016 CLINICAL DATA:  Syncopal episode EXAM: CT HEAD WITHOUT CONTRAST TECHNIQUE: Contiguous axial images were obtained from the base of the skull through the vertex without intravenous contrast. COMPARISON:  MRI 03/21/2016, CT brain 06/10/2016 FINDINGS: Brain: No acute territorial infarction, hemorrhage or intracranial mass is seen. Moderate periventricular, subcortical and deep white matter small vessel ischemic changes.  Old lacunar infarcts in the left thalamus and basal ganglia. Moderate atrophy. Stable ventricle size. Vascular: No hyperdense vessels.  Carotid artery calcification. Skull: No fracture or suspicious bone lesion. Sinuses/Orbits: Complete opacification of the left frontal sinus. No acute orbital abnormality.  Other: None IMPRESSION: No CT evidence for acute intracranial abnormality. Atrophy and moderate small vessel ischemic changes of the white matter. Electronically Signed   By: Jasmine Pang M.D.   On: 11/25/2016 18:23    Procedures Procedures (including critical care time)  Medications Ordered in ED Medications - No data to display   Initial Impression / Assessment and Plan / ED Course  I have reviewed the triage vital signs and the nursing notes.  Pertinent labs & imaging results that were available during my care of the patient were reviewed by me and considered in my medical decision making (see chart for details).     83 year old male with a history of also of his dementia, coronary artery disease, diabetes, hypertension who presents with concern for syncopal episode at his memory care facility. EKG shows sinus rhythm with no prolonged QTc, no Brugada, no delta waves, no other acute ST changes. Patient without anemia. CMP shows no significant Y abnormalities. Neurologic exam within normal limits, patient denies other neurologic symptoms and have low suspicion for CVA as etiology of syncope. No history of trauma associated with incident. History is not consistent with seizure.  Given age and medical problems, pt admitted for syncope observation.   Final Clinical Impressions(s) / ED Diagnoses   Final diagnoses:  Syncope, unspecified syncope type    New Prescriptions Current Discharge Medication List       Alvira Monday, MD 11/25/16 [phone]

## 2016-11-25 NOTE — ED Notes (Signed)
Patient transported to CT 

## 2016-11-25 NOTE — ED Notes (Signed)
Report attempted to floor x1.

## 2016-11-26 DIAGNOSIS — I10 Essential (primary) hypertension: Secondary | ICD-10-CM | POA: Diagnosis not present

## 2016-11-26 DIAGNOSIS — F0150 Vascular dementia without behavioral disturbance: Secondary | ICD-10-CM

## 2016-11-26 DIAGNOSIS — G309 Alzheimer's disease, unspecified: Secondary | ICD-10-CM

## 2016-11-26 DIAGNOSIS — E1159 Type 2 diabetes mellitus with other circulatory complications: Secondary | ICD-10-CM

## 2016-11-26 DIAGNOSIS — F0280 Dementia in other diseases classified elsewhere without behavioral disturbance: Secondary | ICD-10-CM

## 2016-11-26 DIAGNOSIS — R55 Syncope and collapse: Secondary | ICD-10-CM | POA: Diagnosis not present

## 2016-11-26 LAB — CBC
HCT: 39 % (ref 39.0–52.0)
HEMOGLOBIN: 12.9 g/dL — AB (ref 13.0–17.0)
MCH: 28.7 pg (ref 26.0–34.0)
MCHC: 33.1 g/dL (ref 30.0–36.0)
MCV: 86.9 fL (ref 78.0–100.0)
Platelets: 118 10*3/uL — ABNORMAL LOW (ref 150–400)
RBC: 4.49 MIL/uL (ref 4.22–5.81)
RDW: 15 % (ref 11.5–15.5)
WBC: 6.6 10*3/uL (ref 4.0–10.5)

## 2016-11-26 LAB — GLUCOSE, CAPILLARY: Glucose-Capillary: 166 mg/dL — ABNORMAL HIGH (ref 65–99)

## 2016-11-26 LAB — MRSA PCR SCREENING: MRSA BY PCR: NEGATIVE

## 2016-11-26 LAB — PROTIME-INR
INR: 1.33
Prothrombin Time: 16.6 seconds — ABNORMAL HIGH (ref 11.4–15.2)

## 2016-11-26 LAB — COMPREHENSIVE METABOLIC PANEL
ALT: 21 U/L (ref 17–63)
ANION GAP: 7 (ref 5–15)
AST: 20 U/L (ref 15–41)
Albumin: 3 g/dL — ABNORMAL LOW (ref 3.5–5.0)
Alkaline Phosphatase: 76 U/L (ref 38–126)
BUN: 19 mg/dL (ref 6–20)
CHLORIDE: 101 mmol/L (ref 101–111)
CO2: 29 mmol/L (ref 22–32)
Calcium: 8.7 mg/dL — ABNORMAL LOW (ref 8.9–10.3)
Creatinine, Ser: 1.19 mg/dL (ref 0.61–1.24)
GFR, EST NON AFRICAN AMERICAN: 54 mL/min — AB (ref >60)
Glucose, Bld: 163 mg/dL — ABNORMAL HIGH (ref 65–99)
POTASSIUM: 3.5 mmol/L (ref 3.5–5.1)
SODIUM: 137 mmol/L (ref 135–145)
Total Bilirubin: 1.2 mg/dL (ref 0.3–1.2)
Total Protein: 6.6 g/dL (ref 6.5–8.1)

## 2016-11-26 MED ORDER — MEMANTINE HCL 5 MG PO TABS: 5.0000 mg | ORAL_TABLET | Freq: Two times a day (BID) | ORAL | 0 refills | Status: AC

## 2016-11-26 MED ORDER — LINAGLIPTIN 5 MG PO TABS
5.0000 mg | ORAL_TABLET | Freq: Every day | ORAL | Status: DC
  Filled 2016-11-26: qty 1

## 2016-11-26 MED ORDER — AMLODIPINE BESYLATE 5 MG PO TABS
5.0000 mg | ORAL_TABLET | Freq: Every day | ORAL | Status: DC
  Filled 2016-11-26: qty 1

## 2016-11-26 NOTE — Clinical Social Work Note (Signed)
Clinical Social Work Assessment  Patient Details  Name: Christian Erickson MRN: [id number] Date of Birth: 1930-10-23  Date of referral:  11/26/16               Reason for consult:  Facility Placement, Discharge Planning                Permission sought to share information with:  Family Supports, Customer service manager Permission granted to share information::  Yes, Verbal Permission Granted  Name::     [name] Stanbrough  Agency::  [facility]  Relationship::  spouse  Contact Information:  [phone]  Housing/Transportation Living arrangements for the past 2 months:  [place] of Information:  Spouse Patient Interpreter Needed:  None Criminal Activity/Legal Involvement Pertinent to Current Situation/Hospitalization:  No - Comment as needed Significant Relationships:  Adult Children, Spouse Lives with:  Facility Resident Do you feel safe going back to the place where you live?  Yes Need for family participation in patient care:  Yes (Comment)  Care giving concerns: Patient is disoriented but pleasant; wife was at bedside. Patient presented from [facility].  Social Worker assessment / plan: CSW met with patient and patient's spouse at bedside. Spouse prefers for patient to return to ALF, where patient is a resident. CSW to complete workup and and remain available for support in discharge planning.  Employment status:  Retired Forensic scientist:  Medicare PT Recommendations:  Not assessed at this time Erick / Referral to community resources:  Other (Comment Required) (from [facility])  Patient/Family's Response to care: Spouse is realistic about care goals for patient. Spouse and patient appreciative of patient's care.  Patient/Family's Understanding of and Emotional Response to Diagnosis, Current Treatment, and Prognosis: Patient's spouse discussed family's wishes for advanced directives planning. Spouse has good understanding of patient's  medical conditions and is hopeful for his return to ALF.  Emotional Assessment Appearance:  Appears stated age Attitude/Demeanor/Rapport:  Other (appropriate) Affect (typically observed):  Pleasant, Appropriate, Calm Orientation:  Oriented to Self Alcohol / Substance use:  Not Applicable Psych involvement (Current and /or in the community):  No (Comment)  Discharge Needs  Concerns to be addressed:  Discharge Planning Concerns, Care Coordination Readmission within the last 30 days:  No Current discharge risk:  Cognitively Impaired Barriers to Discharge:  Continued Medical Work up   Estanislado Emms, [place] 11/26/2016, 11:07 AM

## 2016-11-26 NOTE — Progress Notes (Signed)
   11/26/16 1111  Clinical Encounter Type  Visited With Patient and family together  Visit Type Initial  Referral From Patient;Family  Consult/Referral To Chaplain  Spiritual Encounters  Spiritual Needs Literature;Brochure  Stress Factors  Patient Stress Factors None identified  Family Stress Factors None identified  Advance Directives (For Healthcare)  Does Patient Have a Medical Advance Directive? No  Would patient like information on creating a medical advance directive? Yes (Inpatient - patient requests chaplain consult to create a medical advance directive)  Spoke with pt. And spouse about completing an AD.  Gave a brief overview of the AD.  Pt. , spouse want to discuss the AD.  Will call Spiritual wellness when ready to complete.   Chaplain Pepe Mineau A. Kennedy BuckerLunsford,  MA-PC , [phone]

## 2016-11-26 NOTE — Care Management Note (Signed)
Case Management Note  Patient Details  Name: Christian Erickson MRN: [id number] Date of Birth: 02-15-1931  Subjective/Objective: Pt presented for [facility] for concerns for syncope. Pt will return to ALF- Memory Care.                     Action/Plan: CM did relay to CSW [place] [facility]. Eileen StanfordJenna stated fir the facility will she will place therapy needs on FL2 and the facility will provide services. No HH Agency contacted for therapy. No further needs from CM at this time.   Expected Discharge Date:  11/26/16               Expected Discharge Plan:  Assisted Living / Rest Home  In-House Referral:  Clinical Social Work  Discharge planning Services  CM Consult  Post Acute Care Choice:  NA Choice offered to:  NA  DME Arranged:  N/A DME Agency:  NA  HH Arranged:  NA HH Agency:  NA  Status of Service:  Completed, signed off  If discussed at [employer] Length of Stay Meetings, dates discussed:    Additional Comments:  Gala LewandowskyGraves-Bigelow, Fumi Guadron Kaye, RN 11/26/2016, 2:28 PM

## 2016-11-26 NOTE — Discharge Summary (Signed)
Physician Discharge Summary  Christian Erickson ZOX:[id number] DOB: 1931/02/15 DOA: 11/25/2016  PCP: System, Pcp Not In  Admit date: 11/25/2016 Discharge date: 11/26/2016   Recommendations for Outpatient Follow-Up:   Consider palliative care referral- wife given MOST form to review and may have more questions Encourage PO intake TED hose Consider d/c of flomax if continues to have orthostatic issues   Discharge Diagnosis:   Active Problems:   DMII (diabetes mellitus, type 2) (HCC)   Dyslipidemia   Essential hypertension   Mixed Alzheimer's and vascular dementia   Syncope   Discharge disposition:  Memory care  Discharge Condition: Improved.  Diet recommendation: Low sodium, heart healthy.  Carbohydrate-modified  Wound care: None.   History of Present Illness:   Presents with concern for syncope.  Facility reports that he was sitting in the dining room, getting ready to have dinner, when he had an episode of syncope that lasted approximately 5 minutes. They reported he had a mild jerk, however no shaking or rhythmic activity consistent with seizure.   Denies chest pain, shortness of breath. Reports that he did feel lightheadedness and nausea prior to syncopal episode. Denies recent illness, including no fever, no urinary symptoms, no cough, no black or bloody stools. Reports he's had normal appetite, no nausea, vomiting or diarrhea. Prior episode of syncope which was thought to be secondary to bradycardia from his beta blockers. Wife also reports concern that he's had syncopal episode, and was later diagnosed with stroke. On review of records, patient had left sided numbness at that time. Today, denies weakness, numbness, trouble speaking.   Hospital Course by Problem:   Syncope -suspect due to BP or worsening of dementia - no sign of seizures -long discussion with wife regarding dementia progression-- given MOST form to review-- outpatient palliative care referral  Htn- d/c  HCTZ, norvasc only  Freq falls-Pt/Ot eval needed-appears to have been d/c from PT recentyl and then had decline- resume PT  ckd II-III-hydrate with IVF 75 cc/h  DMty II-only check sugars-do not aggressively control.  Holding glipizide and saXAGLIPTIN  Will add SSI if sugars above 200 in am  hypothyroid-continue synthroid at 25 mcg dose.  CVA-cont ASa and plavix   BPH-probably needs 0.4 mg flomax.  monitor orthostatics prior to d/c    Medical Consultants:       Discharge Exam:   Vitals:   11/26/16 1213 11/26/16 1306  BP: 113/67 113/67  Pulse: (!) 57   Resp: 18   Temp: 97.1 F (36.2 C)    Vitals:   11/26/16 0442 11/26/16 0502 11/26/16 1213 11/26/16 1306  BP: (!) 129/42  113/67 113/67  Pulse:  63 (!) 57   Resp:   18   Temp:  97.9 F (36.6 C) 97.1 F (36.2 C)   TempSrc:  Oral Oral   SpO2:  99% 96%   Weight:  75.2 kg (165 lb 12.8 oz)    Height:        Gen:  NAD    The results of significant diagnostics from this hospitalization (including imaging, microbiology, ancillary and laboratory) are listed below for reference.     Procedures and Diagnostic Studies:   Dg Chest 2 View  Result Date: 11/25/2016 CLINICAL DATA:  83 y/o  M; syncope. EXAM: CHEST  2 VIEW COMPARISON:  03/19/2016 chest radiograph FINDINGS: Stable heart size and mediastinal contours are within normal limits. Both lungs are clear. Moderate degenerative changes of the thoracic spine. IMPRESSION: No active cardiopulmonary disease. Electronically Signed  By: Mitzi Hansen M.D.   On: 11/25/2016 14:35   Ct Head Wo Contrast  Result Date: 11/25/2016 CLINICAL DATA:  Syncopal episode EXAM: CT HEAD WITHOUT CONTRAST TECHNIQUE: Contiguous axial images were obtained from the base of the skull through the vertex without intravenous contrast. COMPARISON:  MRI 03/21/2016, CT brain 06/10/2016 FINDINGS: Brain: No acute territorial infarction, hemorrhage or intracranial mass is seen. Moderate  periventricular, subcortical and deep white matter small vessel ischemic changes. Old lacunar infarcts in the left thalamus and basal ganglia. Moderate atrophy. Stable ventricle size. Vascular: No hyperdense vessels.  Carotid artery calcification. Skull: No fracture or suspicious bone lesion. Sinuses/Orbits: Complete opacification of the left frontal sinus. No acute orbital abnormality. Other: None IMPRESSION: No CT evidence for acute intracranial abnormality. Atrophy and moderate small vessel ischemic changes of the white matter. Electronically Signed   By: Jasmine Pang M.D.   On: 11/25/2016 18:23     Labs:   Basic Metabolic Panel:  Recent Labs Lab 11/25/16 1352 11/26/16 0321  NA 133* 137  K 3.8 3.5  CL 99* 101  CO2 26 29  GLUCOSE 197* 163*  BUN 23* 19  CREATININE 1.28* 1.19  CALCIUM 8.7* 8.7*  MG 1.9  --    GFR Estimated Creatinine Clearance: 41.7 mL/min (by C-G formula based on SCr of 1.19 mg/dL). Liver Function Tests:  Recent Labs Lab 11/25/16 1352 11/26/16 0321  AST 24 20  ALT 24 21  ALKPHOS 87 76  BILITOT 1.1 1.2  PROT 7.0 6.6  ALBUMIN 3.2* 3.0*   No results for input(s): LIPASE, AMYLASE in the last 168 hours. No results for input(s): AMMONIA in the last 168 hours. Coagulation profile  Recent Labs Lab 11/26/16 0321  INR 1.33    CBC:  Recent Labs Lab 11/25/16 1352 11/26/16 0321  WBC 7.2 6.6  NEUTROABS 5.5  --   HGB 13.5 12.9*  HCT 39.6 39.0  MCV 86.8 86.9  PLT 110* 118*   Cardiac Enzymes: No results for input(s): CKTOTAL, CKMB, CKMBINDEX, TROPONINI in the last 168 hours. BNP: Invalid input(s): POCBNP CBG:  Recent Labs Lab 11/26/16 1316  GLUCAP 166*   D-Dimer No results for input(s): DDIMER in the last 72 hours. Hgb A1c No results for input(s): HGBA1C in the last 72 hours. Lipid Profile No results for input(s): CHOL, HDL, LDLCALC, TRIG, CHOLHDL, LDLDIRECT in the last 72 hours. Thyroid function studies No results for input(s): TSH,  T4TOTAL, T3FREE, THYROIDAB in the last 72 hours.  Invalid input(s): FREET3 Anemia work up No results for input(s): VITAMINB12, FOLATE, FERRITIN, TIBC, IRON, RETICCTPCT in the last 72 hours. Microbiology Recent Results (from the past 240 hour(s))  MRSA PCR Screening     Status: None   Collection Time: 11/25/16 11:19 PM  Result Value Ref Range Status   MRSA by PCR NEGATIVE NEGATIVE Final    Comment:        The GeneXpert MRSA Assay (FDA approved for NASAL specimens only), is one component of a comprehensive MRSA colonization surveillance program. It is not intended to diagnose MRSA infection nor to guide or monitor treatment for MRSA infections.      Discharge Instructions:   Discharge Instructions    Diet - low sodium heart healthy    Complete by:  As directed    Discharge instructions    Complete by:  As directed    Monitor blood sugars-- resume glipizide if needed TED hose encourage PO intake Consider palliative care referral   Increase activity slowly  Complete by:  As directed      Allergies as of 11/26/2016      Reactions   Beef-derived Products Swelling   Pork-derived Products Swelling   Vasotec Other (See Comments)   Angioedema, see OV 09-05-11   Other Swelling, Other (See Comments)   veal      Medication List    STOP taking these medications   atorvastatin 40 MG tablet Commonly known as:  LIPITOR   fluticasone 50 MCG/ACT nasal spray Commonly known as:  FLONASE   glipiZIDE 10 MG tablet Commonly known as:  GLUCOTROL   hydrochlorothiazide 25 MG tablet Commonly known as:  HYDRODIURIL     TAKE these medications   amLODipine 5 MG tablet Commonly known as:  NORVASC Take 1 tablet (5 mg total) by mouth daily.   aspirin EC 81 MG tablet Take 1 tablet (81 mg total) by mouth daily.   bimatoprost 0.01 % Soln Commonly known as:  LUMIGAN Place 1 drop into both eyes at bedtime.   citalopram 10 MG tablet Commonly known as:  CELEXA Take 1 tablet (10  mg total) by mouth daily.   clopidogrel 75 MG tablet Commonly known as:  PLAVIX Take 1 tablet (75 mg total) by mouth daily with breakfast. What changed:  when to take this   EPINEPHrine 0.3 mg/0.3 mL Soaj injection Commonly known as:  EPIPEN 2-PAK Inject 0.3 mLs (0.3 mg total) into the muscle once. What changed:  when to take this  reasons to take this   IMODIUM A-D 1 MG/7.5ML Liqd Generic drug:  Loperamide HCl Take 30 mLs by mouth 3 (three) times daily as needed (For loose stool.).   levothyroxine 25 MCG tablet Commonly known as:  SYNTHROID, LEVOTHROID Take 1 tablet (25 mcg total) by mouth daily before breakfast.   MAPAP 500 MG tablet Generic drug:  acetaminophen Take 1,000 mg by mouth every 6 (six) hours as needed (For pain.).   memantine 5 MG tablet Commonly known as:  NAMENDA Take 1 tablet (5 mg total) by mouth 2 (two) times daily. What changed:  medication strength  See the new instructions.   multivitamin with minerals Tabs tablet Take 1 tablet by mouth daily.   saxagliptin HCl 5 MG Tabs tablet Commonly known as:  ONGLYZA Take 1 tablet (5 mg total) by mouth daily.   SENEXON-S 8.6-50 MG tablet Generic drug:  senna-docusate Take 1 tablet by mouth at bedtime as needed (For constipation.).   tamsulosin 0.4 MG Caps capsule Commonly known as:  FLOMAX Take 1 capsule (0.4 mg total) by mouth daily. What changed:  when to take this      Follow-up Information    Schedule an appointment as soon as possible for a visit  with Your primary care physician.        Schedule an appointment as soon as possible for a visit  with [facility] [address].   Specialty:  Cardiology Why:  regarding syncopal episode Contact information: [address], [address] [place] [id number] [phone]           Time coordinating discharge: 25 min  Signed:  JESSICA Juanetta GoslingU VANN   [facility] 11/26/2016, 1:41 PM

## 2016-11-26 NOTE — Plan of Care (Signed)
Problem: Education: Goal: Knowledge of [facility] Education information/materials will improve Outcome: Progressing Education primarily to wife. Patient is unable to understand his plan of care. Doesn't understand he's in the hospital. Patient is pleasantly confused, reorientation does not last.    Problem: Safety: Goal: Ability to remain free from injury will improve Outcome: Progressing Family at bedside. Bed alarm armed. Call light within reach of wife.   Problem: Pain Managment: Goal: General experience of comfort will improve Outcome: Progressing No complaints of pain per patient   Problem: Skin Integrity: Goal: Risk for impaired skin integrity will decrease Outcome: Progressing Skin Intact. Patient incontinent, condom cath applied.

## 2016-11-26 NOTE — NC FL2 (Addendum)
[place] MEDICAID FL2 LEVEL OF CARE SCREENING TOOL     IDENTIFICATION  Patient Name: Christian Erickson Birthdate: 12-09-1930 Sex: male Admission Date (Current Location): 11/25/2016  [facility] [place]   Facility and Address:  [facility], [address], [place], [place] [address]      Provider Number: [id number]  Attending Physician Name and Address:  Zayvon ArtVann, Jessica U, DO  Relative Name and Phone Number:  Chales Salmonnthanette Kersten, spouse,  [phone],  [phone]     Current Level of Care: Hospital Recommended Level of Care: Assisted Living Facility Prior Approval Number:    Date Approved/Denied:   PASRR Number:    Discharge Plan: Other (Comment) (ALF - [facility])    Current Diagnoses: Patient Active Problem List   Diagnosis Date Noted  . Syncope, cardiogenic 11/25/2016  . CVA (cerebral infarction) 03/22/2016  . Syncope 03/20/2016  . Bradycardia 03/20/2016  . PCP NOTES >>>> 05/21/2015  . Mixed Alzheimer's and vascular dementia 10/04/2013  . Stroke (HCC) 07/23/2013  . Acute CVA (cerebrovascular accident) (HCC) 07/23/2013  . Loss of weight 12/11/2012  . Memory difficulty 05/26/2012  . Hypothyroidism 01/20/2012  . Medicare annual wellness visit, subsequent 01/06/2012  . Edema 01/06/2012  . BPH (benign prostatic hyperplasia) 09/05/2011  . DJD (degenerative joint disease) 06/24/2011  . ALLERGIC RHINITIS 09/10/2010  . BACK PAIN 08/21/2009  . DMII (diabetes mellitus, type 2) (HCC) 05/25/2009  . THROMBOCYTOPENIA 08/30/2008  . GLAUCOMA 08/30/2008  . ERECTILE DYSFUNCTION 01/25/2007  . Dyslipidemia 12/15/2006  . Essential hypertension 12/15/2006  . DIVERTICULITIS, HX OF 12/15/2006    Orientation RESPIRATION BLADDER Height & Weight     Self  Normal Incontinent Weight: 165 lb 12.8 oz (75.2 kg) Height:  5\' 6"  (167.6 cm)  BEHAVIORAL SYMPTOMS/MOOD NEUROLOGICAL BOWEL NUTRITION STATUS        Diet (please see DC summary)   AMBULATORY STATUS COMMUNICATION OF NEEDS Skin   Limited Assist Verbally Normal                       Personal Care Assistance Level of Assistance  Bathing, Feeding, Dressing Bathing Assistance: Limited assistance Feeding assistance: Limited assistance Dressing Assistance: Limited assistance     Functional Limitations Info  Sight, Hearing, Speech Sight Info: Adequate Hearing Info: Adequate Speech Info: Adequate    SPECIAL CARE FACTORS FREQUENCY   PT OT  PT frequency: 3/wk with home health  OT frequency: 3/wk with home health                Contractures Contractures Info: Not present    Additional Factors Info  Code Status, Allergies, Psychotropic Code Status Info: Full Allergies Info: Beef-derived Products, Pork-derived Products, Vasotec, Other Psychotropic Info: Celexa         Discharge Medications: STOP taking these medications   atorvastatin 40 MG tablet Commonly known as:  LIPITOR   fluticasone 50 MCG/ACT nasal spray Commonly known as:  FLONASE   glipiZIDE 10 MG tablet Commonly known as:  GLUCOTROL   hydrochlorothiazide 25 MG tablet Commonly known as:  HYDRODIURIL     TAKE these medications   amLODipine 5 MG tablet Commonly known as:  NORVASC Take 1 tablet (5 mg total) by mouth daily.   aspirin EC 81 MG tablet Take 1 tablet (81 mg total) by mouth daily.   bimatoprost 0.01 % Soln Commonly known as:  LUMIGAN Place 1 drop into both eyes at bedtime.   citalopram 10 MG tablet Commonly  known as:  CELEXA Take 1 tablet (10 mg total) by mouth daily.   clopidogrel 75 MG tablet Commonly known as:  PLAVIX Take 1 tablet (75 mg total) by mouth daily with breakfast. What changed:  when to take this   EPINEPHrine 0.3 mg/0.3 mL Soaj injection Commonly known as:  EPIPEN 2-PAK Inject 0.3 mLs (0.3 mg total) into the muscle once. What changed:  when to take this  reasons to take this   IMODIUM A-D 1 MG/7.5ML Liqd Generic drug:   Loperamide HCl Take 30 mLs by mouth 3 (three) times daily as needed (For loose stool.).   levothyroxine 25 MCG tablet Commonly known as:  SYNTHROID, LEVOTHROID Take 1 tablet (25 mcg total) by mouth daily before breakfast.   MAPAP 500 MG tablet Generic drug:  acetaminophen Take 1,000 mg by mouth every 6 (six) hours as needed (For pain.).   memantine 5 MG tablet Commonly known as:  NAMENDA Take 1 tablet (5 mg total) by mouth 2 (two) times daily. What changed:  medication strength  See the new instructions.   multivitamin with minerals Tabs tablet Take 1 tablet by mouth daily.   saxagliptin HCl 5 MG Tabs tablet Commonly known as:  ONGLYZA Take 1 tablet (5 mg total) by mouth daily.   SENEXON-S 8.6-50 MG tablet Generic drug:  senna-docusate Take 1 tablet by mouth at bedtime as needed (For constipation.).   tamsulosin 0.4 MG Caps capsule Commonly known as:  FLOMAX Take 1 capsule (0.4 mg total) by mouth daily. What changed:  when to take this      Relevant Imaging Results:  Relevant Lab Results:   Additional Information SSN: [id number]  Abigail Butts, LCSW

## 2016-11-26 NOTE — Progress Notes (Signed)
[facility] Anticipated discharge date: 11/26/16 Family notified: Cassell ClementAnthenette Thul, spouse Transportation by: PTAR   CSW signing off.  Abigail ButtsSusan Sidnee Gambrill, LCSWA  Clinical Social Worker

## 2016-11-26 NOTE — Progress Notes (Signed)
PT Cancellation Note  Patient Details Name: Christian Erickson MRN: [id number] DOB: [birth year]   Cancelled Treatment:    Reason Eval/Treat Not Completed: Other (comment)  Per pt's wife, discharge papers already signed and waiting on ambulance. Pt's wife reports pt is very confused. Per OT notes, pt close to baseline. Will follow up if pt still here tomorrow.  Margot ChimesBrittany Smith, PT, DPT  Acute Rehabilitation Services  Pager: [phone]   Melvyn NovasBrittany L Smith 11/26/2016, 2:33 PM

## 2016-11-26 NOTE — Progress Notes (Signed)
Pt and wife received all discharge information and education. Pt and wife verbalize understanding. Report was given to [place]ittany, Charity fundraiserN at his facility at 1440. Transport was called and patient will be transported to facility via ambulance today.

## 2016-11-26 NOTE — Evaluation (Signed)
Occupational Therapy Evaluation and Discharge Patient Details Name: Christian Erickson MRN: [id number] DOB: May 23, 1931 Today's Date: 11/26/2016    History of Present Illness Pt admitted following a syncopal episode from [facility] memory care unit. PMH: advanced dementia, alcoholism, HTN, DM, CVA, arthritis.   Clinical Impression   Pt is functioning at his baseline in ADL and mobility. No further OT needs.    Follow Up Recommendations  No OT follow up;Supervision/Assistance - 24 hour    Equipment Recommendations  None recommended by OT    Recommendations for Other Services       Precautions / Restrictions Precautions Precautions: Fall Restrictions Weight Bearing Restrictions: No      Mobility Bed Mobility Overal bed mobility: Needs Assistance Bed Mobility: Supine to Sit;Sit to Supine     Supine to sit: Max assist Sit to supine: Max assist   General bed mobility comments: pt unable to follow commands with multimodal cues to assist, requires assist  for all aspects  Transfers   Equipment used: 1 person hand held assist             General transfer comment: attempted to stand, unable with one person assist, increased posterior lean    Balance Overall balance assessment: Needs assistance   Sitting balance-Leahy Scale: Poor Sitting balance - Comments: requires one hand on rail to maintain balance, holds feet off floor Postural control: Posterior lean                                 ADL either performed or assessed with clinical judgement   ADL Overall ADL's : At baseline                                             Vision Patient Visual Report: No change from baseline       Perception     Praxis      Pertinent Vitals/Pain Pain Assessment: No/denies pain     Hand Dominance Right   Extremity/Trunk Assessment Upper Extremity Assessment Upper Extremity Assessment: Overall WFL for tasks assessed   Lower Extremity  Assessment Lower Extremity Assessment: Defer to PT evaluation   Cervical / Trunk Assessment Cervical / Trunk Assessment: Normal   Communication Communication Communication: No difficulties   Cognition Arousal/Alertness: Awake/alert Behavior During Therapy: Impulsive;Restless Overall Cognitive Status: History of cognitive impairments - at baseline                                     General Comments       Exercises     Shoulder Instructions      Home Living Family/patient expects to be discharged to:: Skilled nursing facility (memory care unit)                                        Prior Functioning/Environment Level of Independence: Needs assistance  Gait / Transfers Assistance Needed: ambulates without an assistive device once he's on his feet per wife, usually requires 2 person assist to stand. ADL's / Homemaking Assistance Needed: dependent in bathing, dressing and toileting, can self feed  OT Problem List: Impaired balance (sitting and/or standing);Decreased coordination;Decreased cognition      OT Treatment/Interventions:      OT Goals(Current goals can be found in the care plan section) Acute Rehab OT Goals Patient Stated Goal: plans to return to [facility]  OT Frequency:     Barriers to D/C:            Co-evaluation              AM-PAC PT "6 Clicks" Daily Activity     Outcome Measure Help from another person eating meals?: A Little Help from another person taking care of personal grooming?: Total Help from another person toileting, which includes using toliet, bedpan, or urinal?: Total Help from another person bathing (including washing, rinsing, drying)?: Total Help from another person to put on and taking off regular upper body clothing?: Total   6 Click Score: 7   End of Session Equipment Utilized During Treatment: Gait belt  Activity Tolerance: Patient tolerated treatment well Patient left:  in bed;with call bell/phone within reach;with bed alarm set;with family/visitor present  OT Visit Diagnosis: Other symptoms and signs involving cognitive function                Time: [id number] OT Time Calculation (min): 28 min Charges:  OT General Charges $OT Visit: 1 Procedure OT Evaluation $OT Eval Moderate Complexity: 1 Procedure OT Treatments $Self Care/Home Management : 8-22 mins G-Codes: OT G-codes **NOT FOR INPATIENT CLASS** Functional Assessment Tool Used: Clinical judgement Functional Limitation: Self care Self Care Current Status ([id number]): At least 1 percent but less than 20 percent impaired, limited or restricted Self Care Goal Status ([id number]): At least 1 percent but less than 20 percent impaired, limited or restricted Self Care Discharge Status [phone]([id number]): At least 1 percent but less than 20 percent impaired, limited or restricted    Evern BioMayberry, Krishana Lutze Lynn 11/26/2016, 12:55 PM  [phone]

## 2017-06-24 ENCOUNTER — Emergency Department (HOSPITAL_COMMUNITY)
Admission: EM | Admit: 2017-06-24 | Discharge: 2017-06-24 | Disposition: A | Discharge status: Home / Self Care | Payer: Medicare Other | Attending: Emergency Medicine | Admitting: Emergency Medicine

## 2017-06-24 ENCOUNTER — Emergency Department (HOSPITAL_COMMUNITY): Payer: Medicare Other

## 2017-06-24 ENCOUNTER — Encounter (HOSPITAL_COMMUNITY): Payer: Self-pay | Admitting: Emergency Medicine

## 2017-06-24 ENCOUNTER — Other Ambulatory Visit: Payer: Self-pay

## 2017-06-24 DIAGNOSIS — I10 Essential (primary) hypertension: Secondary | ICD-10-CM | POA: Diagnosis not present

## 2017-06-24 DIAGNOSIS — Z8673 Personal history of transient ischemic attack (TIA), and cerebral infarction without residual deficits: Secondary | ICD-10-CM | POA: Insufficient documentation

## 2017-06-24 DIAGNOSIS — E119 Type 2 diabetes mellitus without complications: Secondary | ICD-10-CM | POA: Insufficient documentation

## 2017-06-24 DIAGNOSIS — W19XXXA Unspecified fall, initial encounter: Secondary | ICD-10-CM | POA: Insufficient documentation

## 2017-06-24 DIAGNOSIS — Y92129 Unspecified place in nursing home as the place of occurrence of the external cause: Secondary | ICD-10-CM | POA: Insufficient documentation

## 2017-06-24 DIAGNOSIS — E039 Hypothyroidism, unspecified: Secondary | ICD-10-CM | POA: Insufficient documentation

## 2017-06-24 DIAGNOSIS — Z7984 Long term (current) use of oral hypoglycemic drugs: Secondary | ICD-10-CM | POA: Diagnosis not present

## 2017-06-24 DIAGNOSIS — F0280 Dementia in other diseases classified elsewhere without behavioral disturbance: Secondary | ICD-10-CM | POA: Diagnosis not present

## 2017-06-24 DIAGNOSIS — Z7982 Long term (current) use of aspirin: Secondary | ICD-10-CM | POA: Diagnosis not present

## 2017-06-24 DIAGNOSIS — G309 Alzheimer's disease, unspecified: Secondary | ICD-10-CM | POA: Diagnosis not present

## 2017-06-24 DIAGNOSIS — Z87891 Personal history of nicotine dependence: Secondary | ICD-10-CM | POA: Diagnosis not present

## 2017-06-24 DIAGNOSIS — Y939 Activity, unspecified: Secondary | ICD-10-CM | POA: Insufficient documentation

## 2017-06-24 DIAGNOSIS — S0081XA Abrasion of other part of head, initial encounter: Secondary | ICD-10-CM

## 2017-06-24 DIAGNOSIS — Y999 Unspecified external cause status: Secondary | ICD-10-CM | POA: Insufficient documentation

## 2017-06-24 DIAGNOSIS — Z7902 Long term (current) use of antithrombotics/antiplatelets: Secondary | ICD-10-CM | POA: Diagnosis not present

## 2017-06-24 LAB — CBC WITH DIFFERENTIAL/PLATELET
Basophils Absolute: 0 10*3/uL (ref 0.0–0.1)
Basophils Relative: 0 %
Eosinophils Absolute: 0.3 10*3/uL (ref 0.0–0.7)
Eosinophils Relative: 3 %
HCT: 38.8 % — ABNORMAL LOW (ref 39.0–52.0)
HEMOGLOBIN: 12.9 g/dL — AB (ref 13.0–17.0)
LYMPHS ABS: 1.6 10*3/uL (ref 0.7–4.0)
LYMPHS PCT: 17 %
MCH: 29.5 pg (ref 26.0–34.0)
MCHC: 33.2 g/dL (ref 30.0–36.0)
MCV: 88.8 fL (ref 78.0–100.0)
Monocytes Absolute: 0.8 10*3/uL (ref 0.1–1.0)
Monocytes Relative: 9 %
NEUTROS ABS: 6.3 10*3/uL (ref 1.7–7.7)
NEUTROS PCT: 71 %
Platelets: 183 10*3/uL (ref 150–400)
RBC: 4.37 MIL/uL (ref 4.22–5.81)
RDW: 13.6 % (ref 11.5–15.5)
WBC: 9 10*3/uL (ref 4.0–10.5)

## 2017-06-24 LAB — BASIC METABOLIC PANEL
ANION GAP: 8 (ref 5–15)
BUN: 26 mg/dL — ABNORMAL HIGH (ref 6–20)
CHLORIDE: 107 mmol/L (ref 101–111)
CO2: 26 mmol/L (ref 22–32)
Calcium: 9.5 mg/dL (ref 8.9–10.3)
Creatinine, Ser: 1.35 mg/dL — ABNORMAL HIGH (ref 0.61–1.24)
GFR calc non Af Amer: 47 mL/min — ABNORMAL LOW (ref >60)
GFR, EST AFRICAN AMERICAN: 54 mL/min — AB (ref >60)
Glucose, Bld: 209 mg/dL — ABNORMAL HIGH (ref 65–99)
Potassium: 4.3 mmol/L (ref 3.5–5.1)
Sodium: 141 mmol/L (ref 135–145)

## 2017-06-24 LAB — PROTIME-INR
INR: 1.08
PROTHROMBIN TIME: 13.9 s (ref 11.4–15.2)

## 2017-06-24 LAB — CK: Total CK: 190 U/L (ref 49–397)

## 2017-06-24 NOTE — ED Notes (Signed)
Got patient on the monitor did ekg shown to Dr pheffier patient is resting with call bell in reach

## 2017-06-24 NOTE — ED Triage Notes (Addendum)
[facility] after unwitnessed fall, possibly form WC to floor. Pt takes Plavix, denies any pain. Hx of TIA's. No real obvious deformity, possible abrasion to R forehead. CBG 237, given Risperdal PTA. Pt doesn't recall event - back to aox3 baseline per EMS

## 2017-06-24 NOTE — ED Notes (Signed)
PTAR contacted for transport back to facility  

## 2017-06-24 NOTE — ED Notes (Signed)
Attempted to obtain urine sample, due to pt's confusion he was unable to understand my need for a urine specimen and was communicating nonsensically.  Will attempt again within the hour.

## 2017-06-24 NOTE — ED Notes (Signed)
Pt's wife has arrived at bedside.  NAD, VSS.  Will continue to monitor for any needs or changes

## 2017-06-24 NOTE — Discharge Instructions (Signed)
Your workup was negative for acute injuries from your fall today.  We did not find evidence of infections.  Please follow-up with your primary care physician in the next several days for reassessment and further management.  If any symptoms change or worsen or he develop severe headaches, please return to the nearest emergency department.

## 2017-06-24 NOTE — ED Provider Notes (Signed)
[facility] EMERGENCY DEPARTMENT Provider Note   CSN: [id number] Arrival date & time: 06/24/17  1439     History   Chief Complaint Chief Complaint  Patient presents with  . Fall on thinners    HPI Christian Erickson is a 83 y.o. male.  The history is provided by the patient and medical records. The history is limited by the condition of the patient. No language interpreter was used.  Fall  This is a new problem. The current episode started 6 to 12 hours ago. The problem occurs constantly. The problem has not changed since onset.Pertinent negatives include no chest pain, no abdominal pain, no headaches and no shortness of breath. Nothing aggravates the symptoms. Nothing relieves the symptoms. He has tried nothing for the symptoms. The treatment provided no relief.    Past Medical History:  Diagnosis Date  . Allergic rhinitis   . Alzheimer's dementia   . Arthritis   . Blood dyscrasia   . BPH (benign prostatic hypertrophy)    nocturia  . Diabetes mellitus   . Diverticulitis   . ED (erectile dysfunction)   . Glaucoma    sees eye doctor routinely  . Hyperlipidemia   . Hypertension   . Stroke [facility]    January 2015 & September 2017  . Subclinical hypothyroidism     Patient Active Problem List   Diagnosis Date Noted  . Syncope, cardiogenic 11/25/2016  . CVA (cerebral infarction) 03/22/2016  . Syncope 03/20/2016  . Bradycardia 03/20/2016  . PCP NOTES >>>> 05/21/2015  . Mixed Alzheimer's and vascular dementia 10/04/2013  . Stroke (HCC) 07/23/2013  . Acute CVA (cerebrovascular accident) (HCC) 07/23/2013  . Loss of weight 12/11/2012  . Memory difficulty 05/26/2012  . Hypothyroidism 01/20/2012  . Medicare annual wellness visit, subsequent 01/06/2012  . Edema 01/06/2012  . BPH (benign prostatic hyperplasia) 09/05/2011  . DJD (degenerative joint disease) 06/24/2011  . ALLERGIC RHINITIS 09/10/2010  . BACK PAIN 08/21/2009  . DMII (diabetes mellitus, type  2) (HCC) 05/25/2009  . THROMBOCYTOPENIA 08/30/2008  . GLAUCOMA 08/30/2008  . ERECTILE DYSFUNCTION 01/25/2007  . Dyslipidemia 12/15/2006  . Essential hypertension 12/15/2006  . DIVERTICULITIS, HX OF 12/15/2006    Past Surgical History:  Procedure Laterality Date  . COLECTOMY     w/ reversal due to divertiuclitis per patient in 1990s aprox  . INGUINAL HERNIA REPAIR    . sbo repair    . TOTAL KNEE ARTHROPLASTY Right 10/27/2012   Procedure: TOTAL KNEE ARTHROPLASTY;  Surgeon: Loreta Ave, MD;  Location: [facility] OR;  Service: Orthopedics;  Laterality: Right;       Home Medications    Prior to Admission medications   Medication Sig Start Date End Date Taking? Authorizing Provider  acetaminophen (MAPAP) 500 MG tablet Take 1,000 mg by mouth every 6 (six) hours as needed (For pain.).    [provider]  amLODipine (NORVASC) 5 MG tablet Take 1 tablet (5 mg total) by mouth daily. 03/25/16   Alison Murray, MD  aspirin EC 81 MG tablet Take 1 tablet (81 mg total) by mouth daily. 03/24/16   Alison Murray, MD  bimatoprost (LUMIGAN) 0.01 % SOLN Place 1 drop into both eyes at bedtime.    [provider]  citalopram (CELEXA) 10 MG tablet Take 1 tablet (10 mg total) by mouth daily. 03/25/16   Alison Murray, MD  clopidogrel (PLAVIX) 75 MG tablet Take 1 tablet (75 mg total) by mouth daily with breakfast. Patient taking  differently: Take 75 mg by mouth daily.  10/03/14   Wanda PlumpPaz, Jose E, MD  EPINEPHrine (EPIPEN 2-PAK) 0.3 mg/0.3 mL IJ SOAJ injection Inject 0.3 mLs (0.3 mg total) into the muscle once. Patient taking differently: Inject 0.3 mg into the muscle as needed (For anaphylaxis.).  01/15/15   Wanda PlumpPaz, Jose E, MD  levothyroxine (SYNTHROID, LEVOTHROID) 25 MCG tablet Take 1 tablet (25 mcg total) by mouth daily before breakfast. 06/12/15   Wanda PlumpPaz, Jose E, MD  Loperamide HCl (IMODIUM A-D) 1 MG/7.5ML LIQD Take 30 mLs by mouth 3 (three) times daily as needed (For loose stool.).     [provider]  memantine (NAMENDA) 5 MG tablet Take 1 tablet (5 mg total) by mouth 2 (two) times daily. 11/26/16   Dayvian ArtVann, Jessica U, DO  Multiple Vitamin (MULTIVITAMIN WITH MINERALS) TABS tablet Take 1 tablet by mouth daily. 07/27/13   Lorena ArtVann, Jessica U, DO  saxagliptin HCl (ONGLYZA) 5 MG TABS tablet Take 1 tablet (5 mg total) by mouth daily. 04/18/15   Wanda PlumpPaz, Jose E, MD  senna-docusate (SENEXON-S) 8.6-50 MG tablet Take 1 tablet by mouth at bedtime as needed (For constipation.).    [provider]  tamsulosin (FLOMAX) 0.4 MG CAPS capsule Take 1 capsule (0.4 mg total) by mouth daily. Patient taking differently: Take 0.4 mg by mouth at bedtime.  11/30/14   Wanda PlumpPaz, Jose E, MD    Family History Family History  Problem Relation Age of Onset  . Lupus Sister   . Stroke Brother [id number]       CVA, no FH premature CAD  . Diabetes Other        granddaughter  . Prostate cancer Other        cousin, age 260  . CAD Mother        "enlarged heart"  . Colon cancer Neg Hx     Social History Social History   Tobacco Use  . Smoking status: Former Smoker    Types: Cigarettes  . Smokeless tobacco: Never Used  . Tobacco comment: used to smoke rarely  Substance Use Topics  . Alcohol use: Yes    Alcohol/week: 0.0 oz    Comment: rarely  . Drug use: No     Allergies   Beef-derived products; Pork-derived products; Vasotec; and Other   Review of Systems Review of Systems  Unable to perform ROS: Dementia  Constitutional: Negative for chills and fever.  HENT: Negative for congestion.   Respiratory: Negative for chest tightness and shortness of breath.   Cardiovascular: Negative for chest pain.  Gastrointestinal: Negative for abdominal pain, diarrhea, nausea and vomiting.  Genitourinary: Negative for dysuria.  Musculoskeletal: Negative for back pain, neck pain and neck stiffness.  Neurological: Negative for light-headedness, numbness and headaches.  Psychiatric/Behavioral: Negative for agitation.     Physical  Exam Updated Vital Signs BP (!) 150/79 (BP Location: Right Arm)   Pulse 82   Temp 98.6 F (37 C) (Oral)   Resp 20   Wt 74.8 kg (165 lb)   SpO2 99% Comment: Simultaneous filing. User may not have seen previous data.  BMI 26.63 kg/m   Physical Exam  Constitutional: He is oriented to person, place, and time. He appears well-developed and well-nourished. No distress.  HENT:  Head: Head is with abrasion.    Right Ear: External ear normal.  Left Ear: External ear normal.  Nose: Nose normal.  Mouth/Throat: Oropharynx is clear and moist. No oropharyngeal exudate.  Eyes: Conjunctivae and EOM are normal. Pupils  are equal, round, and reactive to light.  Neck: Normal range of motion. No JVD present.  Cardiovascular: Normal rate and intact distal pulses.  No murmur heard. Pulmonary/Chest: Effort normal. No respiratory distress. He has no wheezes. He exhibits no tenderness.  Abdominal: Soft. He exhibits no distension. There is no tenderness.  Musculoskeletal: He exhibits no tenderness.  Neurological: He is alert and oriented to person, place, and time. No cranial nerve deficit or sensory deficit. He exhibits normal muscle tone.  Skin: Capillary refill takes less than 2 seconds. He is not diaphoretic. No erythema. No pallor.  Nursing note and vitals reviewed.    ED Treatments / Results  Labs (all labs ordered are listed, but only abnormal results are displayed) Labs Reviewed  CBC WITH DIFFERENTIAL/PLATELET - Abnormal; Notable for the following components:      Result Value   Hemoglobin 12.9 (*)    HCT 38.8 (*)    All other components within normal limits  BASIC METABOLIC PANEL - Abnormal; Notable for the following components:   Glucose, Bld 209 (*)    BUN 26 (*)    Creatinine, Ser 1.35 (*)    GFR calc non Af Amer 47 (*)    GFR calc Af Amer 54 (*)    All other components within normal limits  URINE CULTURE  PROTIME-INR  CK    EKG  EKG Interpretation  Date/Time:  Wednesday  June 24 2017 14:43:54 EST Ventricular Rate:  73 PR Interval:    QRS Duration: 93 QT Interval:  367 QTC Calculation: 405 R Axis:   32 Text Interpretation:  Sinus rhythm Low voltage, precordial leads When cmpared to prior, no significant changes seen.  No STEMI Confirmed by Theda Belfast ([id number]) on 06/24/2017 3:07:53 PM       Radiology Dg Chest 2 View  Result Date: 06/24/2017 CLINICAL DATA:  83 y/o  M; fall. EXAM: CHEST  2 VIEW COMPARISON:  11/25/2016 chest radiograph FINDINGS: Stable heart size and mediastinal contours are within normal limits. Both lungs are clear. The visualized skeletal structures are unremarkable. IMPRESSION: No active cardiopulmonary disease. Electronically Signed   By: Mitzi Hansen M.D.   On: 06/24/2017 16:07   Ct Head Wo Contrast  Result Date: 06/24/2017 CLINICAL DATA:  Unwitnessed fall. EXAM: CT HEAD WITHOUT CONTRAST CT CERVICAL SPINE WITHOUT CONTRAST TECHNIQUE: Multidetector CT imaging of the head and cervical spine was performed following the standard protocol without intravenous contrast. Multiplanar CT image reconstructions of the cervical spine were also generated. COMPARISON:  11/25/2016 FINDINGS: CT HEAD FINDINGS Brain: There is atrophy and chronic small vessel disease changes. Old left internal capsule lacunar infarct. No acute intracranial abnormality. Specifically, no hemorrhage, hydrocephalus, mass lesion, acute infarction, or significant intracranial injury. Vascular: No hyperdense vessel or unexpected calcification. Skull: No acute calvarial abnormality. Sinuses/Orbits: No acute finding Other: None CT CERVICAL SPINE FINDINGS Alignment: Normal Skull base and vertebrae: No fracture Soft tissues and spinal canal: Prevertebral soft tissues are normal. No epidural or paraspinal hematoma. Disc levels: Diffuse degenerative disc and facet disease throughout the cervical spine. Upper chest: Negative Other: Dense carotid bulb calcifications  bilaterally. IMPRESSION: No acute intracranial abnormality.Atrophy, chronic microvascular disease. Diffuse degenerative changes in the cervical spine. No acute bony abnormality. Electronically Signed   By: Charlett Nose M.D.   On: 06/24/2017 15:53   Ct Cervical Spine Wo Contrast  Result Date: 06/24/2017 CLINICAL DATA:  Unwitnessed fall. EXAM: CT HEAD WITHOUT CONTRAST CT CERVICAL SPINE WITHOUT CONTRAST TECHNIQUE: Multidetector CT imaging of the  head and cervical spine was performed following the standard protocol without intravenous contrast. Multiplanar CT image reconstructions of the cervical spine were also generated. COMPARISON:  11/25/2016 FINDINGS: CT HEAD FINDINGS Brain: There is atrophy and chronic small vessel disease changes. Old left internal capsule lacunar infarct. No acute intracranial abnormality. Specifically, no hemorrhage, hydrocephalus, mass lesion, acute infarction, or significant intracranial injury. Vascular: No hyperdense vessel or unexpected calcification. Skull: No acute calvarial abnormality. Sinuses/Orbits: No acute finding Other: None CT CERVICAL SPINE FINDINGS Alignment: Normal Skull base and vertebrae: No fracture Soft tissues and spinal canal: Prevertebral soft tissues are normal. No epidural or paraspinal hematoma. Disc levels: Diffuse degenerative disc and facet disease throughout the cervical spine. Upper chest: Negative Other: Dense carotid bulb calcifications bilaterally. IMPRESSION: No acute intracranial abnormality.Atrophy, chronic microvascular disease. Diffuse degenerative changes in the cervical spine. No acute bony abnormality. Electronically Signed   By: Charlett Nose M.D.   On: 06/24/2017 15:53    Procedures Procedures (including critical care time)  Medications Ordered in ED Medications - No data to display   Initial Impression / Assessment and Plan / ED Course  I have reviewed the triage vital signs and the nursing notes.  Pertinent labs & imaging results  that were available during my care of the patient were reviewed by me and considered in my medical decision making (see chart for details).     SAAHAS HIDROGO is a 83 y.o. male with a past medical history significant for Alzheimer's, prior stroke on aspirin and Plavix, hypertension, and hyperlipidemia who presents for an unwitnessed fall at his facility.  According to EMS report to nursing, patient had unwitnessed fall and was found on the ground.  Unknown downtime.  Patient noted to have an abrasion to his right forehead.  Patient does not member the fall or the circumstances.  He is disoriented and thinks it is 106 and he is not at the hospital.  Patient is oriented to his name.  Patient currently denies any fevers, chills, chest pain, shortness breath, nausea, vomiting, vision changes, confusion, diarrhea, dysuria.  Patient denies any headache or neck pain but is in a cervical immobilization towel.  Patient denies any other complaints.  On examination, patient found to right forehead.  Patient is able to move all extremities and normal sensation in all extremity's.  Lungs clear and abdomen nontender.  Normal extraocular movements and normal pupil exam.  No tenderness noted to back of head or neck.  Due to unknown circumstances of fall and patient's inability to recall episode, patient will have workup including labs and imaging to look for traumatic injuries.  Patient will also have urinalysis and chest x-ray to look for occult infection.  Next  Anticipate reassessment after workup.  6:33 PM CT imaging of the head and neck were reassuring.  No evidence of new intracranial abnormality.  Atrophy seen.  No cervical spine injury seen.  No neck tenderness.  Laboratory testing overall reassuring.    Patient's wife came to the emergency department and provided collateral history.  She reports that he likely slid out of the wheelchair and was down for chest a minute before being found today.  She denies  of having any preceding symptoms.  Given his lack of urinary symptoms, do not feel we need to wait for urinalysis at this time.  Wife reports that patient is at his mental status baseline.  Given the reassuring imaging and workup, feel patient is safe for discharge back home.  And was given return precautions for signs and symptoms of intracranial injuries including delayed bleed as he is on Plavix.  Patient will follow up with his PCP in the next several days.  Patient wife understood plan of care and patient was discharged in good condition.   Final Clinical Impressions(s) / ED Diagnoses   Final diagnoses:  Fall, initial encounter  Abrasion of face, initial encounter     Clinical Impression: 1. Fall, initial encounter   2. Abrasion of face, initial encounter     Disposition: Discharge  Condition: Good  I have discussed the results, Dx and Tx plan with the pt(& family if present). He/she/they expressed understanding and agree(s) with the plan. Discharge instructions discussed at great length. Strict return precautions discussed and pt &/or family have verbalized understanding of the instructions. No further questions at time of discharge.    This SmartLink is deprecated. Use AVSMEDLIST instead to display the medication list for a patient.  [facility] [address] [id number] [place] [id number] [phone]  If symptoms worsen     Azzure Garabedian, Canary Brimhristopher J, MD 06/25/17 [phone]

## 2017-08-13 ENCOUNTER — Encounter (HOSPITAL_BASED_OUTPATIENT_CLINIC_OR_DEPARTMENT_OTHER): Payer: Medicare Other | Attending: Internal Medicine

## 2017-08-13 DIAGNOSIS — Z96651 Presence of right artificial knee joint: Secondary | ICD-10-CM | POA: Diagnosis not present

## 2017-08-13 DIAGNOSIS — Z8673 Personal history of transient ischemic attack (TIA), and cerebral infarction without residual deficits: Secondary | ICD-10-CM | POA: Insufficient documentation

## 2017-08-13 DIAGNOSIS — E119 Type 2 diabetes mellitus without complications: Secondary | ICD-10-CM | POA: Diagnosis not present

## 2017-08-13 DIAGNOSIS — L89612 Pressure ulcer of right heel, stage 2: Secondary | ICD-10-CM | POA: Insufficient documentation

## 2017-08-13 DIAGNOSIS — Z87891 Personal history of nicotine dependence: Secondary | ICD-10-CM | POA: Insufficient documentation

## 2017-08-13 DIAGNOSIS — L89620 Pressure ulcer of left heel, unstageable: Secondary | ICD-10-CM | POA: Insufficient documentation

## 2017-08-13 DIAGNOSIS — G309 Alzheimer's disease, unspecified: Secondary | ICD-10-CM | POA: Insufficient documentation

## 2017-08-13 DIAGNOSIS — F0280 Dementia in other diseases classified elsewhere without behavioral disturbance: Secondary | ICD-10-CM | POA: Diagnosis not present

## 2017-08-16 IMAGING — CR DG CHEST 2V
2 series · 2 of 2 positions shown · non-contrast
Comparison: 03/19/2016 chest radiograph

CLINICAL DATA: 86 y/o  M; syncope.

EXAM:
CHEST  2 VIEW

[chest pa]
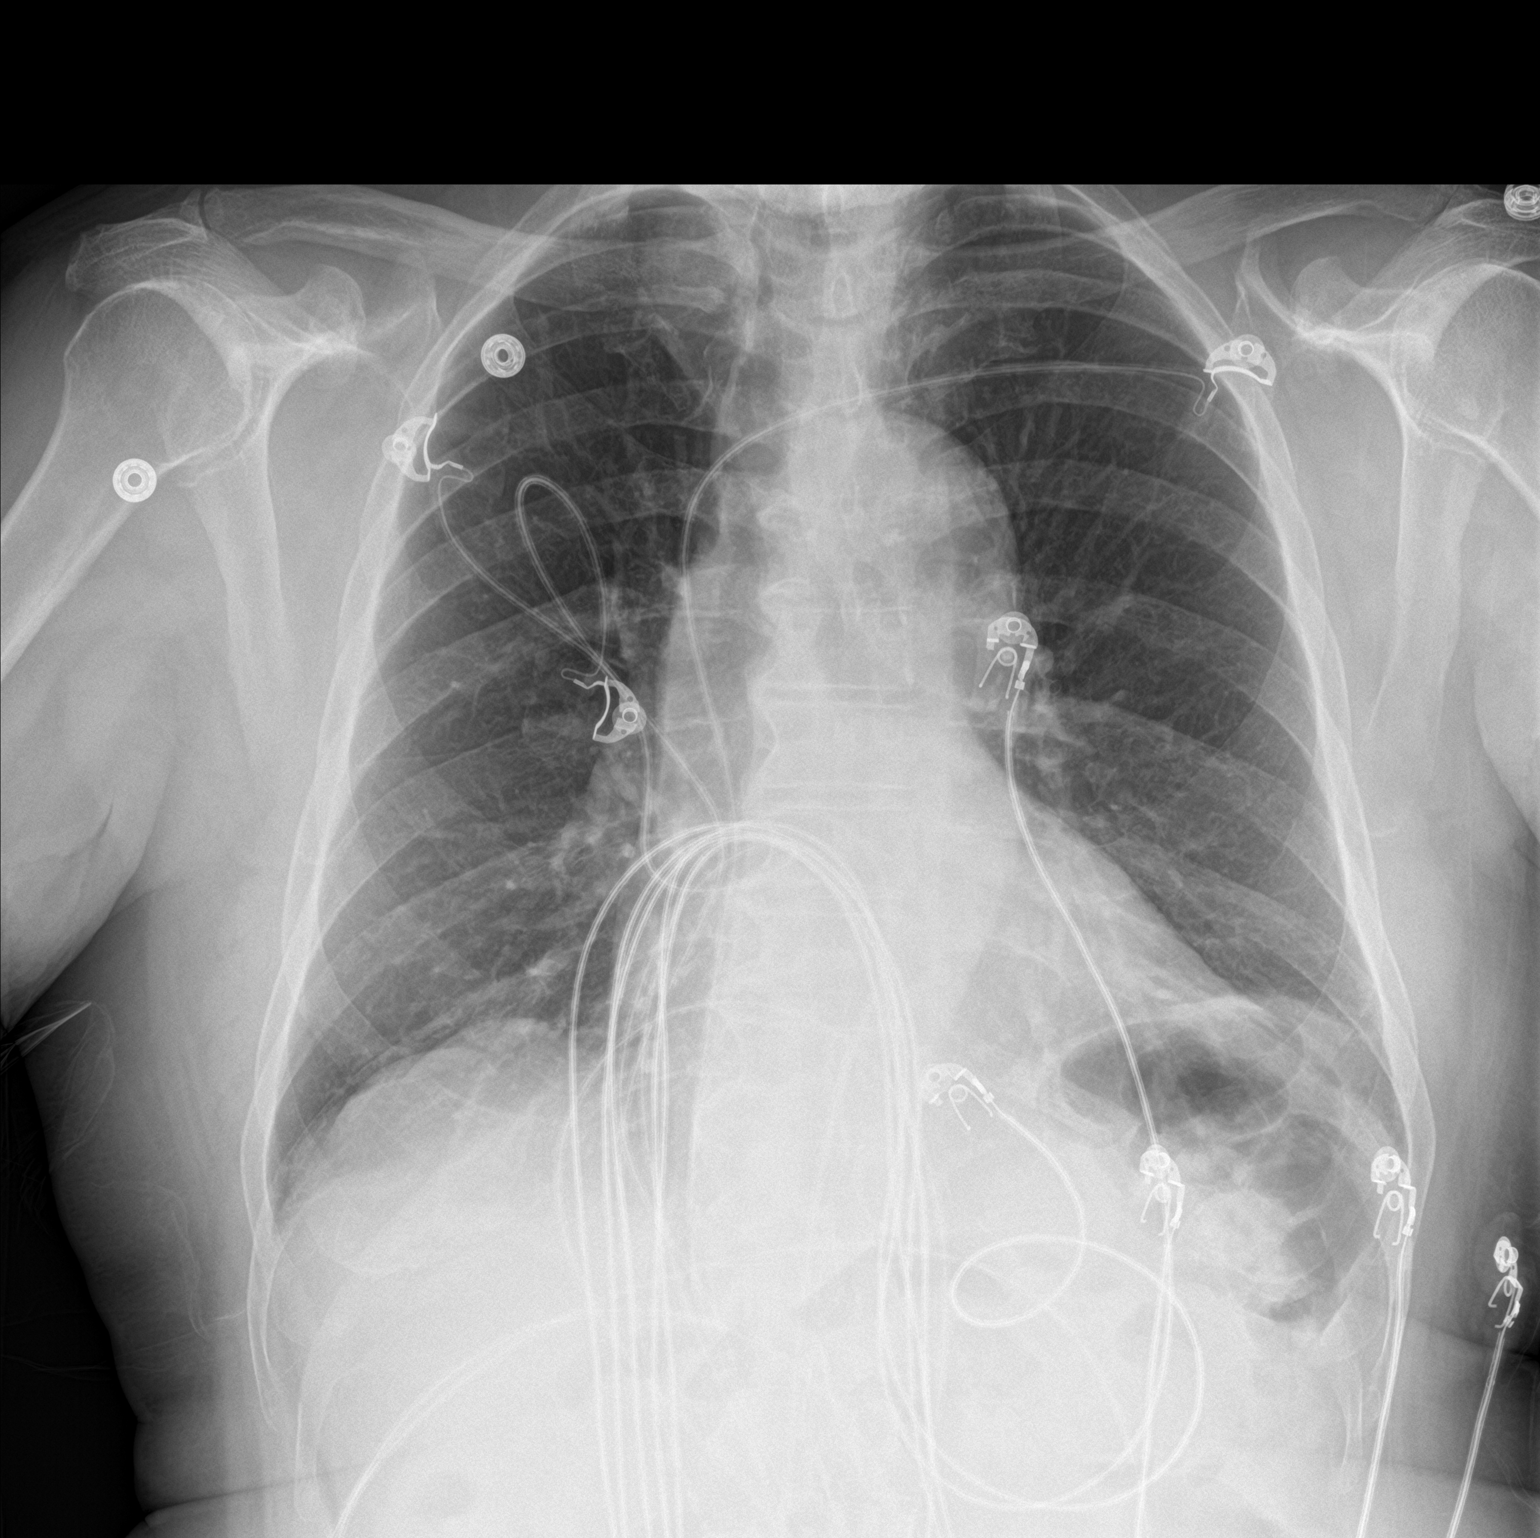

[chest lat]
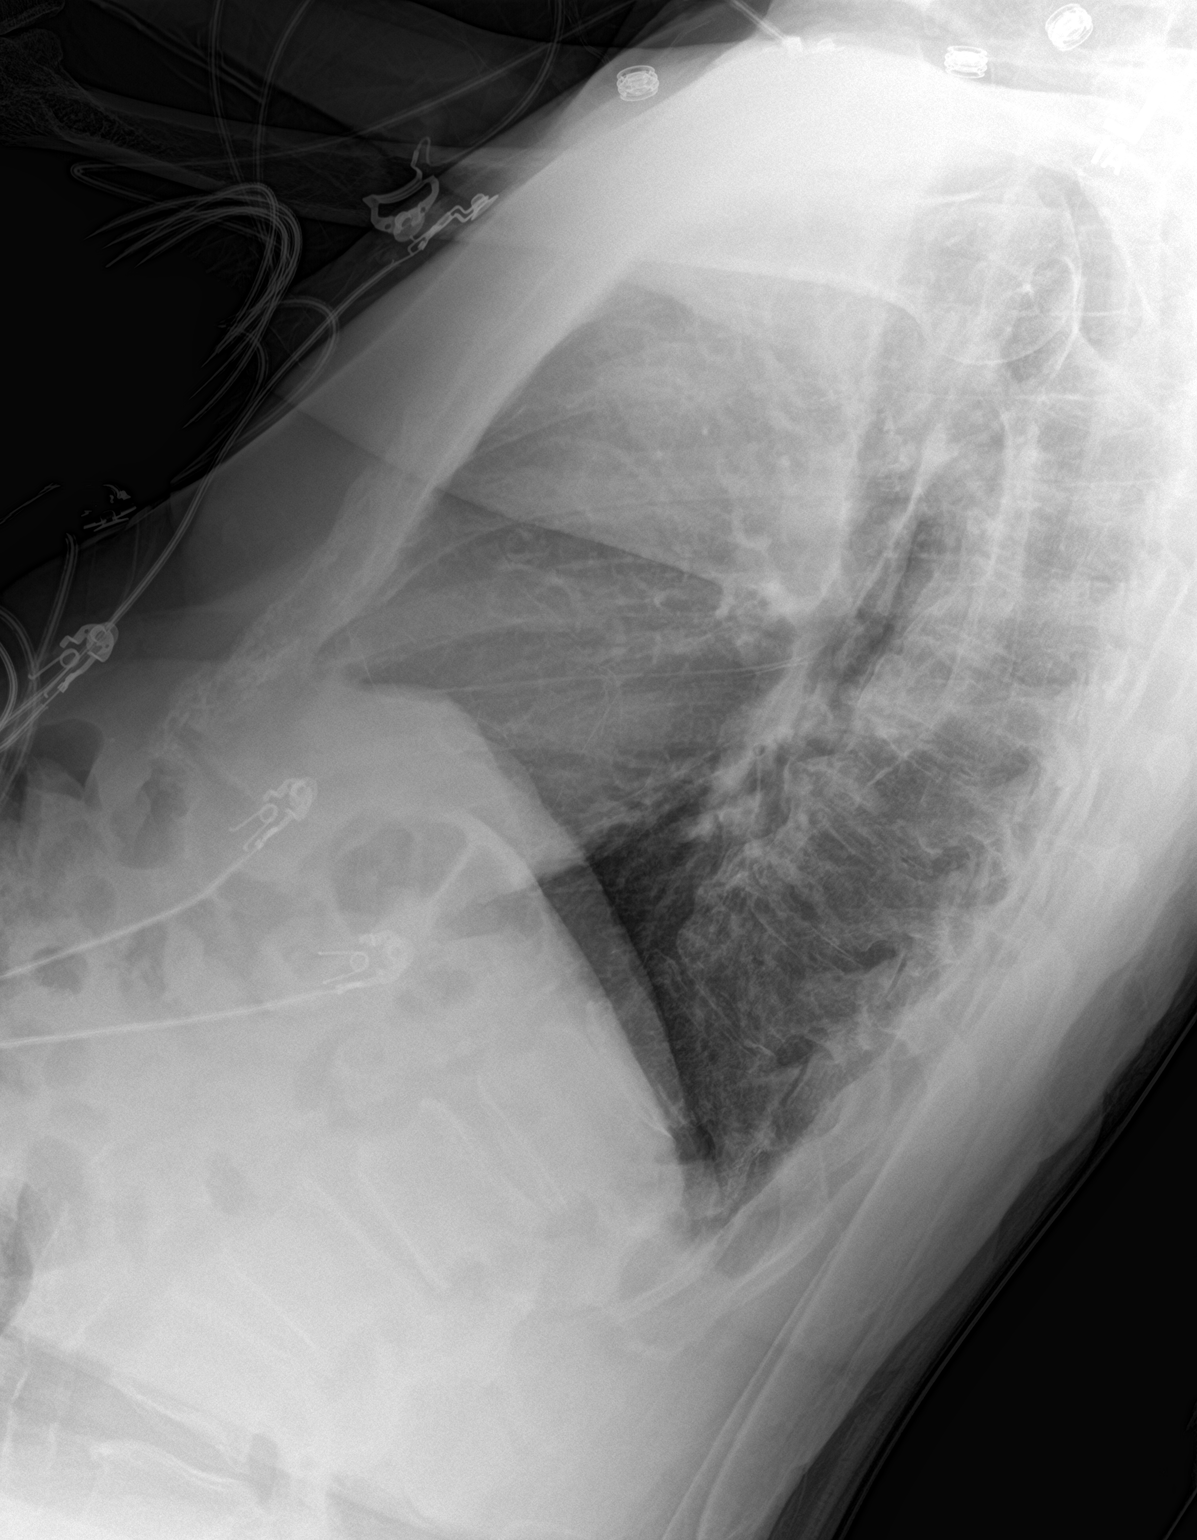

[2 of 2 positions shown; findings below may reference images not displayed]

FINDINGS: Stable heart size and mediastinal contours are within normal limits.
Both lungs are clear. Moderate degenerative changes of the thoracic
spine.
IMPRESSION: No active cardiopulmonary disease.

By: Candles B Kirberg M.D.

## 2017-08-16 IMAGING — CT CT HEAD W/O CM
4 series · 17 of 47 positions shown, 19 images · non-contrast
Comparison: MRI 03/21/2016, CT brain 06/10/2016

CLINICAL DATA: Syncopal episode

EXAM:
CT HEAD WITHOUT CONTRAST
TECHNIQUE: Contiguous axial images were obtained from the base of the skull
through the vertex without intravenous contrast.

[Series 3: head without · axial · non-contrast · 0.45mm/px · z∈[+430,+555]mm · 7 of 35 slices shown, 9 images]
[im 5/35  brain]
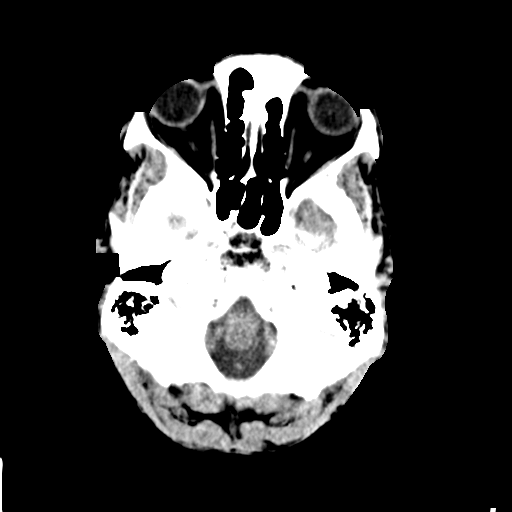
[im 5/35  bone]
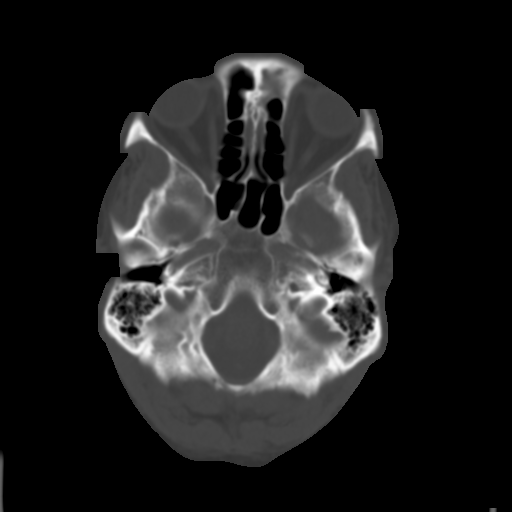
[im 9/35  brain]
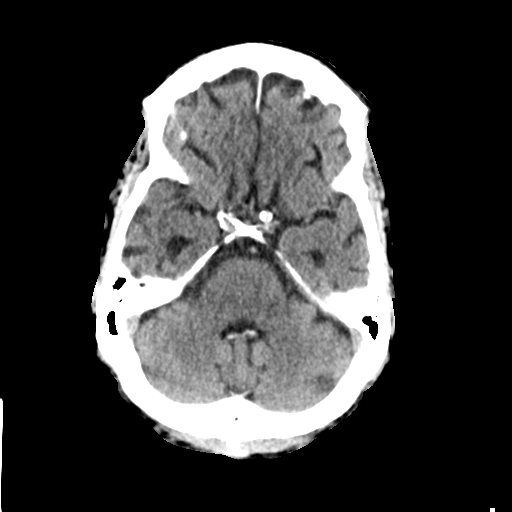
[im 13/35  brain]
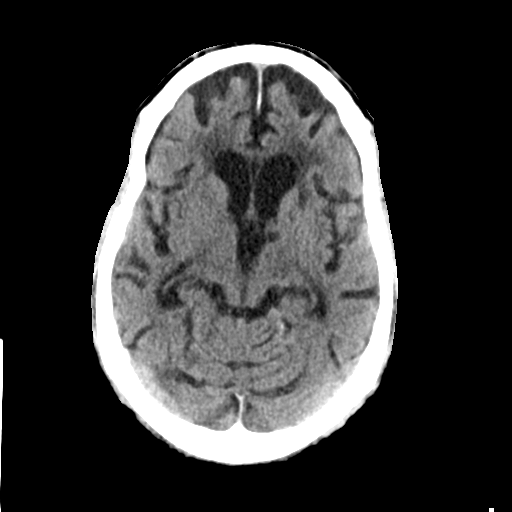
[im 18/35  brain]
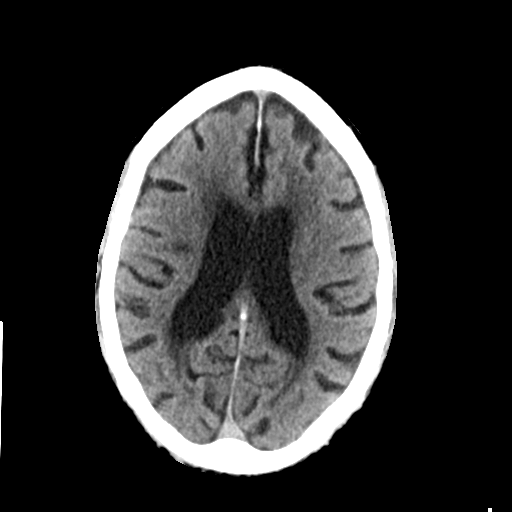
[im 22/35  brain]
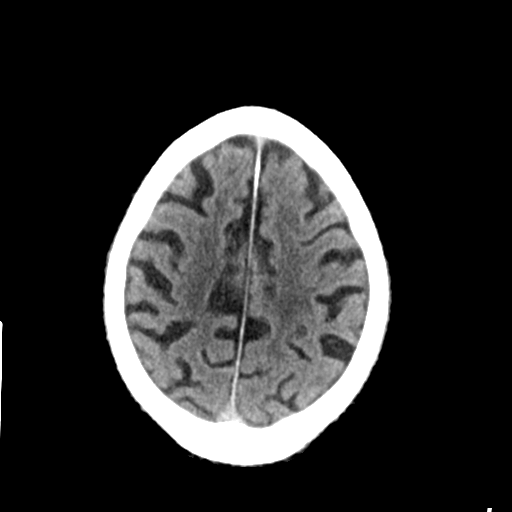
[im 22/35  bone]
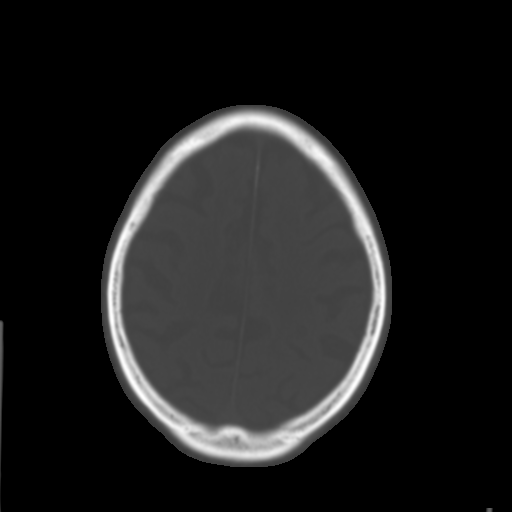
[im 26/35  brain]
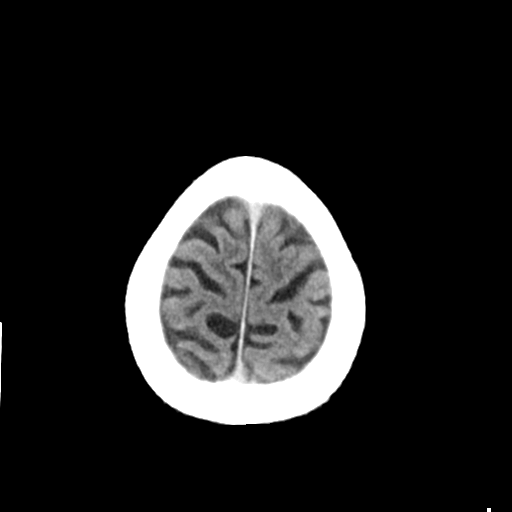
[im 30/35  brain]
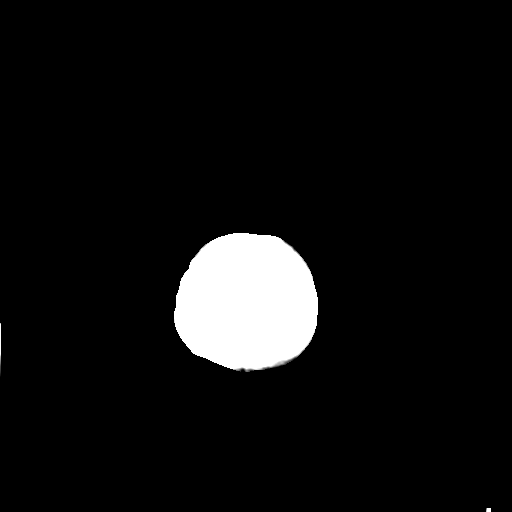

[Series 4: head bone · axial · 0.45mm/px · z∈[+426,+486]mm · 4 of 86 slices shown]
[im 9/86  bone]
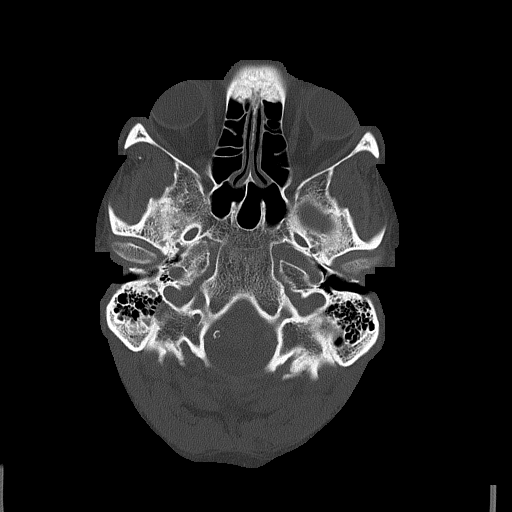
[im 18/86  bone]
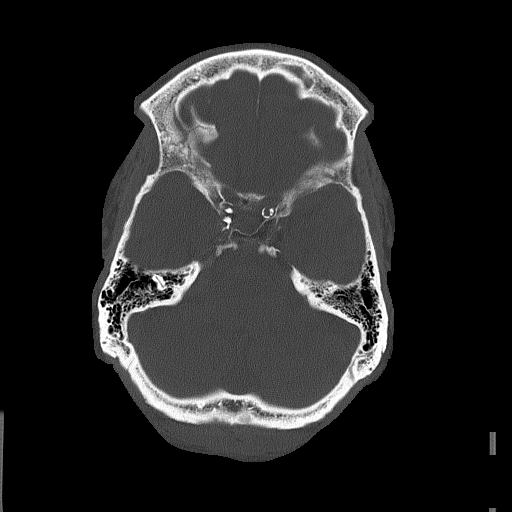
[im 26/86  bone]
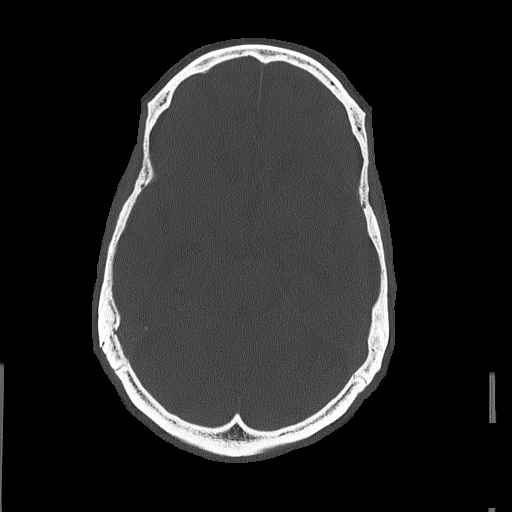
[im 39/86  bone]
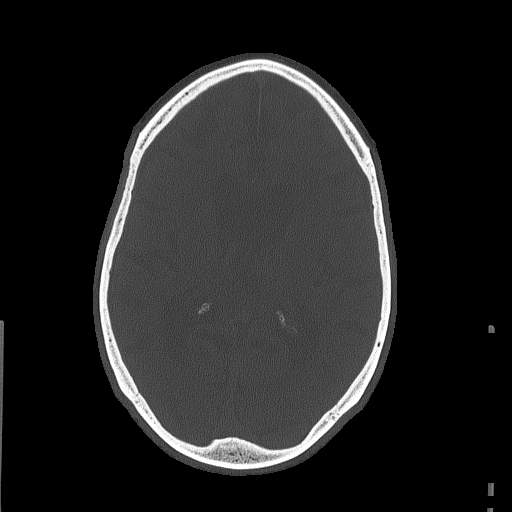

[Series 5: head without cor · coronal · non-contrast · 0.31mm/px · 3 of 69 slices shown]
[im 23/69  brain]
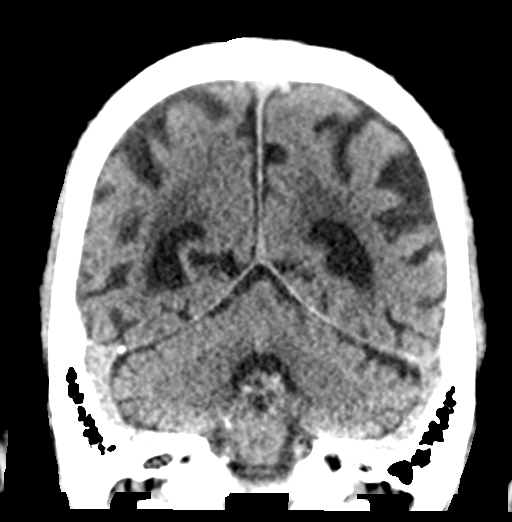
[im 31/69  brain]
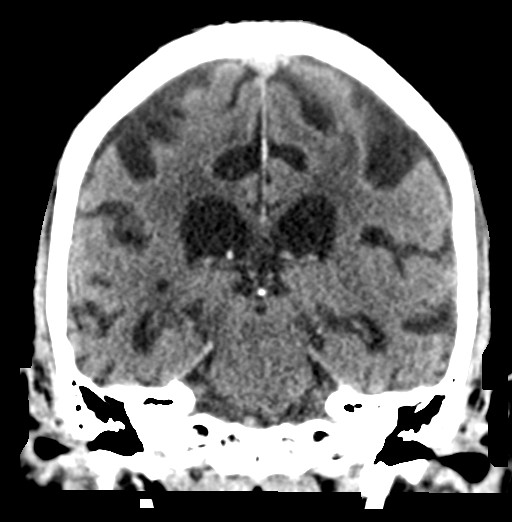
[im 38/69  brain]
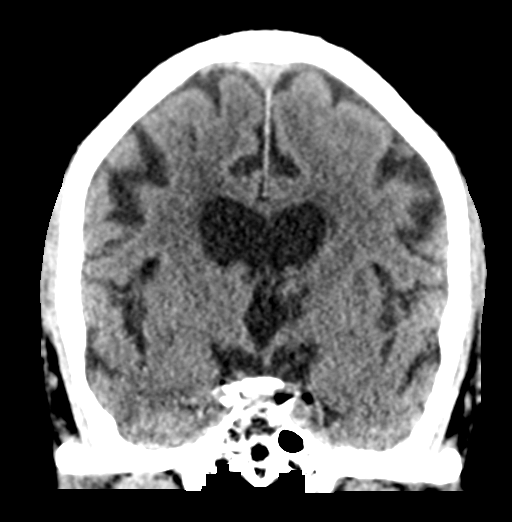

[Series 6: head without sag · sagittal · non-contrast · 0.32mm/px · 3 of 57 slices shown]
[im 19/57  brain]
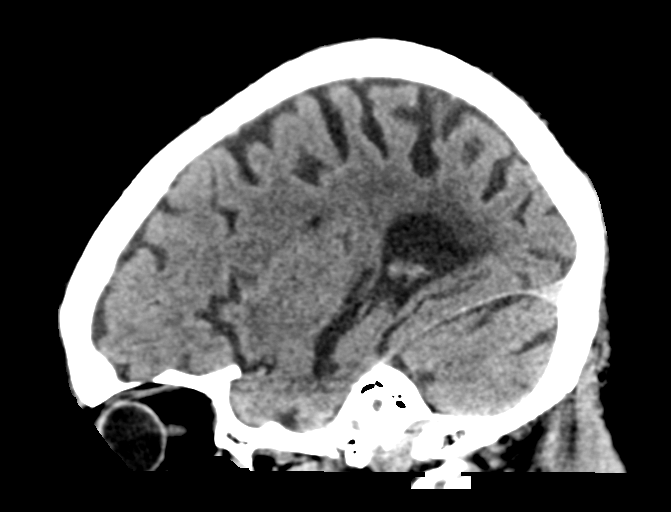
[im 29/57  brain]
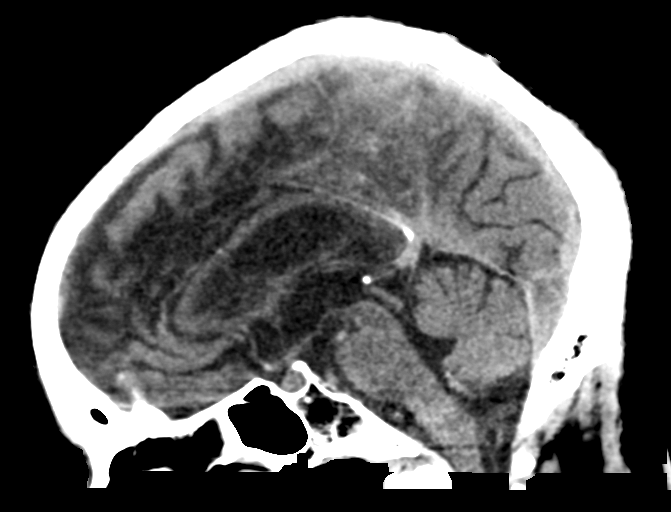
[im 38/57  brain]
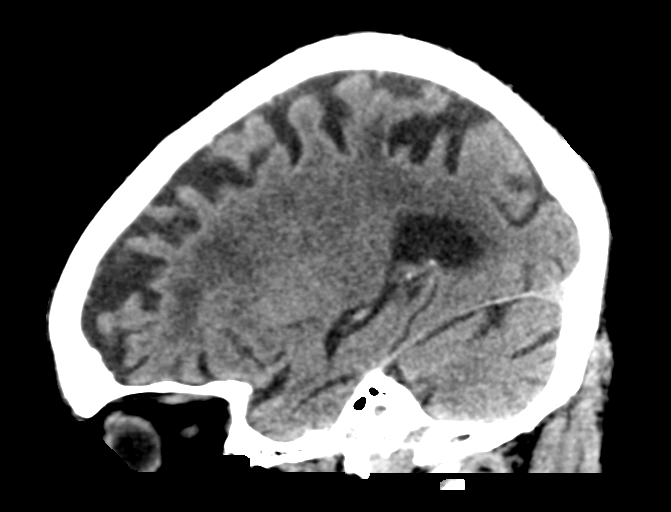

[17 of 47 positions shown; findings below may reference images not displayed]

FINDINGS: Brain: No acute territorial infarction, hemorrhage or intracranial
mass is seen. Moderate periventricular, subcortical and deep white
matter small vessel ischemic changes. Old lacunar infarcts in the
left thalamus and basal ganglia. Moderate atrophy. Stable ventricle
size.

Vascular: No hyperdense vessels.  Carotid artery calcification.

Skull: No fracture or suspicious bone lesion.

Sinuses/Orbits: Complete opacification of the left frontal sinus. No
acute orbital abnormality.

Other: None
IMPRESSION: No CT evidence for acute intracranial abnormality. Atrophy and
moderate small vessel ischemic changes of the white matter.

## 2017-08-27 DIAGNOSIS — L89620 Pressure ulcer of left heel, unstageable: Secondary | ICD-10-CM | POA: Diagnosis not present

## 2017-09-10 ENCOUNTER — Encounter (HOSPITAL_BASED_OUTPATIENT_CLINIC_OR_DEPARTMENT_OTHER): Payer: Medicare Other

## 2017-09-15 DIAGNOSIS — Z6841 Body Mass Index (BMI) 40.0 and over, adult: Secondary | ICD-10-CM | POA: Insufficient documentation

## 2017-09-15 DIAGNOSIS — I2510 Atherosclerotic heart disease of native coronary artery without angina pectoris: Secondary | ICD-10-CM | POA: Insufficient documentation

## 2017-09-15 DIAGNOSIS — L97219 Non-pressure chronic ulcer of right calf with unspecified severity: Secondary | ICD-10-CM | POA: Insufficient documentation

## 2017-09-15 DIAGNOSIS — E11622 Type 2 diabetes mellitus with other skin ulcer: Secondary | ICD-10-CM | POA: Insufficient documentation

## 2017-09-15 DIAGNOSIS — I10 Essential (primary) hypertension: Secondary | ICD-10-CM | POA: Insufficient documentation

## 2017-09-15 DIAGNOSIS — Z794 Long term (current) use of insulin: Secondary | ICD-10-CM | POA: Insufficient documentation

## 2017-09-15 DIAGNOSIS — I890 Lymphedema, not elsewhere classified: Secondary | ICD-10-CM | POA: Insufficient documentation

## 2017-09-15 DIAGNOSIS — I87331 Chronic venous hypertension (idiopathic) with ulcer and inflammation of right lower extremity: Secondary | ICD-10-CM | POA: Insufficient documentation

## 2017-09-15 DIAGNOSIS — L97812 Non-pressure chronic ulcer of other part of right lower leg with fat layer exposed: Secondary | ICD-10-CM | POA: Insufficient documentation

## 2017-09-15 DIAGNOSIS — E1140 Type 2 diabetes mellitus with diabetic neuropathy, unspecified: Secondary | ICD-10-CM | POA: Insufficient documentation

## 2017-09-15 DIAGNOSIS — G4730 Sleep apnea, unspecified: Secondary | ICD-10-CM | POA: Insufficient documentation

## 2017-09-15 DIAGNOSIS — E1136 Type 2 diabetes mellitus with diabetic cataract: Secondary | ICD-10-CM | POA: Insufficient documentation

## 2017-12-13 ENCOUNTER — Encounter (HOSPITAL_COMMUNITY): Payer: Self-pay | Admitting: Emergency Medicine

## 2017-12-13 ENCOUNTER — Emergency Department (HOSPITAL_COMMUNITY)
Admission: EM | Admit: 2017-12-13 | Discharge: 2017-12-14 | Disposition: A | Discharge status: Home / Self Care | Attending: Emergency Medicine | Admitting: Emergency Medicine

## 2017-12-13 ENCOUNTER — Emergency Department (HOSPITAL_COMMUNITY)

## 2017-12-13 DIAGNOSIS — Z7982 Long term (current) use of aspirin: Secondary | ICD-10-CM | POA: Insufficient documentation

## 2017-12-13 DIAGNOSIS — I10 Essential (primary) hypertension: Secondary | ICD-10-CM | POA: Diagnosis not present

## 2017-12-13 DIAGNOSIS — E039 Hypothyroidism, unspecified: Secondary | ICD-10-CM | POA: Insufficient documentation

## 2017-12-13 DIAGNOSIS — R319 Hematuria, unspecified: Secondary | ICD-10-CM | POA: Diagnosis not present

## 2017-12-13 DIAGNOSIS — E119 Type 2 diabetes mellitus without complications: Secondary | ICD-10-CM | POA: Insufficient documentation

## 2017-12-13 DIAGNOSIS — Z79899 Other long term (current) drug therapy: Secondary | ICD-10-CM | POA: Insufficient documentation

## 2017-12-13 DIAGNOSIS — Z7902 Long term (current) use of antithrombotics/antiplatelets: Secondary | ICD-10-CM | POA: Insufficient documentation

## 2017-12-13 DIAGNOSIS — N390 Urinary tract infection, site not specified: Secondary | ICD-10-CM | POA: Diagnosis not present

## 2017-12-13 DIAGNOSIS — F0280 Dementia in other diseases classified elsewhere without behavioral disturbance: Secondary | ICD-10-CM | POA: Diagnosis not present

## 2017-12-13 DIAGNOSIS — Z87891 Personal history of nicotine dependence: Secondary | ICD-10-CM | POA: Insufficient documentation

## 2017-12-13 DIAGNOSIS — R55 Syncope and collapse: Secondary | ICD-10-CM | POA: Insufficient documentation

## 2017-12-13 DIAGNOSIS — F0390 Unspecified dementia without behavioral disturbance: Secondary | ICD-10-CM

## 2017-12-13 DIAGNOSIS — Z96651 Presence of right artificial knee joint: Secondary | ICD-10-CM | POA: Insufficient documentation

## 2017-12-13 DIAGNOSIS — Z7984 Long term (current) use of oral hypoglycemic drugs: Secondary | ICD-10-CM | POA: Insufficient documentation

## 2017-12-13 DIAGNOSIS — R404 Transient alteration of awareness: Secondary | ICD-10-CM

## 2017-12-13 DIAGNOSIS — G309 Alzheimer's disease, unspecified: Secondary | ICD-10-CM | POA: Diagnosis not present

## 2017-12-13 LAB — BASIC METABOLIC PANEL
Anion gap: 8 (ref 5–15)
BUN: 22 mg/dL — AB (ref 6–20)
CALCIUM: 8.9 mg/dL (ref 8.9–10.3)
CHLORIDE: 101 mmol/L (ref 101–111)
CO2: 26 mmol/L (ref 22–32)
CREATININE: 1.39 mg/dL — AB (ref 0.61–1.24)
GFR calc Af Amer: 52 mL/min — ABNORMAL LOW (ref >60)
GFR calc non Af Amer: 45 mL/min — ABNORMAL LOW (ref >60)
Glucose, Bld: 115 mg/dL — ABNORMAL HIGH (ref 65–99)
Potassium: 4.3 mmol/L (ref 3.5–5.1)
Sodium: 135 mmol/L (ref 135–145)

## 2017-12-13 LAB — URINALYSIS, ROUTINE W REFLEX MICROSCOPIC
Bilirubin Urine: NEGATIVE
Glucose, UA: NEGATIVE mg/dL
Ketones, ur: NEGATIVE mg/dL
Nitrite: NEGATIVE
Protein, ur: 100 mg/dL — AB
SPECIFIC GRAVITY, URINE: 1.016 (ref 1.005–1.030)
WBC, UA: >50 WBC/hpf — ABNORMAL HIGH (ref 0–5)
pH: 5 (ref 5.0–8.0)

## 2017-12-13 LAB — CBC
HCT: 43.7 % (ref 39.0–52.0)
Hemoglobin: 14.2 g/dL (ref 13.0–17.0)
MCH: 29 pg (ref 26.0–34.0)
MCHC: 32.5 g/dL (ref 30.0–36.0)
MCV: 89.4 fL (ref 78.0–100.0)
PLATELETS: 142 10*3/uL — AB (ref 150–400)
RBC: 4.89 MIL/uL (ref 4.22–5.81)
RDW: 13.9 % (ref 11.5–15.5)
WBC: 5.8 10*3/uL (ref 4.0–10.5)

## 2017-12-13 LAB — I-STAT TROPONIN, ED: TROPONIN I, POC: 0 ng/mL (ref 0.00–0.08)

## 2017-12-13 MED ORDER — SODIUM CHLORIDE 0.9 % IV SOLN
1.0000 g | Freq: Once | INTRAVENOUS | Status: AC
  Administered 2017-12-13: 1 g via INTRAVENOUS
  Filled 2017-12-13: qty 10

## 2017-12-13 MED ORDER — LEVOFLOXACIN 500 MG PO TABS: 500.0000 mg | ORAL_TABLET | Freq: Every day | ORAL | 0 refills | Status: AC

## 2017-12-13 NOTE — ED Notes (Signed)
[facility]

## 2017-12-13 NOTE — ED Notes (Signed)
Pt transported to xray 

## 2017-12-13 NOTE — ED Triage Notes (Signed)
Per EMS:  Pt presents to ED after being checked on approx 3pm and assesments were normal.  Approx 10 minutes later staff notes patient to be unresponsive except to painful stimuli.  Patient axox1 upon EMS arrival.  Pt on a memory unit with a hx of dementia.  EKG unremarkable.  [facility]

## 2017-12-13 NOTE — ED Provider Notes (Signed)
[facility]   CSN: [id number] Arrival date & time: 12/13/17  1604     History   Chief Complaint Chief Complaint  Patient presents with  . Loss of Consciousness  Level 5 caveat secondary to dementia  HPI Christian Erickson is a 84 y.o. male.  HPI Level 5 caveat secondary to dementia initial history obtained from nursing note via EMS 84 year old man history of dementia on hospice care presents today via EMS with reports that he was last seen normal at 3 PM.  Approximately 10 minutes later staff noted he was unresponsive except to painful stimuli.  Patient is alert here in the ED.  He is oriented to himself and knows that he stays in the facility was unable to tell me the name. I called wife and discussed above with her.  She is in route to ED Past Medical History:  Diagnosis Date  . Allergic rhinitis   . Alzheimer's dementia   . Arthritis   . Blood dyscrasia   . BPH (benign prostatic hypertrophy)    nocturia  . Diabetes mellitus   . Diverticulitis   . ED (erectile dysfunction)   . Glaucoma    sees eye doctor routinely  . Hyperlipidemia   . Hypertension   . Stroke [facility](HCC)    January 2015 & September 2017  . Subclinical hypothyroidism     Patient Active Problem List   Diagnosis Date Noted  . Syncope, cardiogenic 11/25/2016  . CVA (cerebral infarction) 03/22/2016  . Syncope 03/20/2016  . Bradycardia 03/20/2016  . PCP NOTES >>>> 05/21/2015  . Mixed Alzheimer's and vascular dementia 10/04/2013  . Stroke (HCC) 07/23/2013  . Acute CVA (cerebrovascular accident) (HCC) 07/23/2013  . Loss of weight 12/11/2012  . Memory difficulty 05/26/2012  . Hypothyroidism 01/20/2012  . Medicare annual wellness visit, subsequent 01/06/2012  . Edema 01/06/2012  . BPH (benign prostatic hyperplasia) 09/05/2011  . DJD (degenerative joint disease) 06/24/2011  . ALLERGIC RHINITIS 09/10/2010  . BACK PAIN 08/21/2009  . DMII (diabetes mellitus,  type 2) (HCC) 05/25/2009  . THROMBOCYTOPENIA 08/30/2008  . GLAUCOMA 08/30/2008  . ERECTILE DYSFUNCTION 01/25/2007  . Dyslipidemia 12/15/2006  . Essential hypertension 12/15/2006  . DIVERTICULITIS, HX OF 12/15/2006    Past Surgical History:  Procedure Laterality Date  . COLECTOMY     w/ reversal due to divertiuclitis per patient in 1990s aprox  . INGUINAL HERNIA REPAIR    . sbo repair    . TOTAL KNEE ARTHROPLASTY Right 10/27/2012   Procedure: TOTAL KNEE ARTHROPLASTY;  Surgeon: Loreta Aveaniel F Murphy, MD;  [facility];  Service: Orthopedics;  Laterality: Right;        Home Medications    Prior to Admission medications   Medication Sig Start Date End Date Taking? Authorizing Provider  acetaminophen (MAPAP) 500 MG tablet Take 1,000 mg by mouth every 6 (six) hours as needed (For pain.).    [provider]  amLODipine (NORVASC) 5 MG tablet Take 1 tablet (5 mg total) by mouth daily. Patient taking differently: Take 10 mg by mouth daily.  03/25/16   Alison Murrayevine, Alma M, MD  aspirin EC 81 MG tablet Take 1 tablet (81 mg total) by mouth daily. 03/24/16   Alison Murrayevine, Alma M, MD  bimatoprost (LUMIGAN) 0.01 % SOLN Place 1 drop into both eyes at bedtime.    [provider]  citalopram (CELEXA) 10 MG tablet Take 1 tablet (10 mg total) by mouth daily. 03/25/16   Manson Passeyevine, Alma  M, MD  clopidogrel (PLAVIX) 75 MG tablet Take 1 tablet (75 mg total) by mouth daily with breakfast. Patient taking differently: Take 75 mg by mouth daily.  10/03/14   Wanda Plump, MD  ENSURE (ENSURE) Take 237 mLs by mouth at bedtime.    [provider]  EPINEPHrine (EPIPEN 2-PAK) 0.3 mg/0.3 mL IJ SOAJ injection Inject 0.3 mLs (0.3 mg total) into the muscle once. Patient taking differently: Inject 0.3 mg into the muscle as needed (For anaphylaxis.).  01/15/15   Wanda Plump, MD  glipiZIDE (GLUCOTROL) 5 MG tablet Take 5 mg by mouth daily. 05/22/17   [provider]  levothyroxine (SYNTHROID, LEVOTHROID) 25 MCG  tablet Take 1 tablet (25 mcg total) by mouth daily before breakfast. 06/12/15   Wanda Plump, MD  memantine (NAMENDA) 5 MG tablet Take 1 tablet (5 mg total) by mouth 2 (two) times daily. 11/26/16   Yared Art, DO  Multiple Vitamin (MULTIVITAMIN WITH MINERALS) TABS tablet Take 1 tablet by mouth daily. 07/27/13   Paxten Art, DO  Podiatric Products (GOLD BOND FOOT EX) Apply 1 application topically daily. Gold Bond foot cream 1% power Apply Topically to both feet every morning  Before Getting dressed and after bath/shower *Avoid applying between toes*    [provider]  PRESCRIPTION MEDICATION Apply 2 mg topically daily as needed. R-lorazepam PLO Gel 2MG /ML Gel Apply  1 ml (2mg )  Topically three times daily as needed  *[facility]*    [provider]  risperiDONE (RISPERDAL) 1 MG/ML oral solution Take 0.5 mg by mouth every morning. Risperidone  With pipette 1MG /1ML solution For risperidone with pipette give 0.5 mL by mouth At 6 AM 06/04/17   [provider]  risperiDONE (RISPERDAL) 1 MG/ML oral solution Take 0.5 mg by mouth at bedtime. For risperidone with pipette give 0.5 ml by mouth daily at 6 PM    [provider]  risperiDONE (RISPERDAL) 1 MG/ML oral solution Take 0.25 mg by mouth daily as needed. For Risperdal with Pipette give  0.25 mL  By mouth every day  As needed for agitation    [provider]  saxagliptin HCl (ONGLYZA) 5 MG TABS tablet Take 1 tablet (5 mg total) by mouth daily. 04/18/15   Wanda Plump, MD  senna-docusate (SENEXON-S) 8.6-50 MG tablet Take 1 tablet by mouth at bedtime as needed (For constipation.).    [provider]  tamsulosin (FLOMAX) 0.4 MG CAPS capsule Take 1 capsule (0.4 mg total) by mouth daily. 11/30/14   Wanda Plump, MD  cloNIDine (CATAPRES) 0.2 MG tablet Take 0.2 mg by mouth 2 (two) times daily.  09/15/11  [provider]    Family History Family History  Problem Relation Age of Onset   . Lupus Sister   . Stroke Brother 23       CVA, no FH premature CAD  . Diabetes Other        granddaughter  . Prostate cancer Other        cousin, age 66  . CAD Mother        "enlarged heart"  . Colon cancer Neg Hx     Social History Social History   Tobacco Use  . Smoking status: Former Smoker    Types: Cigarettes  . Smokeless tobacco: Never Used  . Tobacco comment: used to smoke rarely  Substance Use Topics  . Alcohol use: Yes    Alcohol/week: 0.0 oz    Comment:  rarely  . Drug use: No     Allergies   Beef-derived products; Pork-derived products; Vasotec; and Other   Review of Systems Review of Systems  All other systems reviewed and are negative.    Physical Exam Updated Vital Signs BP 139/86 (BP Location: Left Arm)   Pulse 80   Temp 98.1 F (36.7 C) (Oral)   Resp (!) 22   SpO2 100%   Physical Exam  Constitutional: He appears well-developed and well-nourished. No distress.  HENT:  Head: Normocephalic and atraumatic.  Right Ear: External ear normal.  Left Ear: External ear normal.  Nose: Nose normal.  Mouth/Throat: Oropharynx is clear and moist.  Eyes: Pupils are equal, round, and reactive to light. EOM are normal.  Neck: Normal range of motion. Neck supple.  Cardiovascular: Normal rate.  Pulmonary/Chest: Effort normal.  Abdominal:  Abdomen is firm Bowel sounds are normal He does not complain of tenderness palpation  Genitourinary: Penis normal.  Musculoskeletal:  Patient was able to move all extremities  Neurological: He is alert. No cranial nerve deficit. Coordination normal.  Skin: Skin is warm. Capillary refill takes less than 2 seconds. He is not diaphoretic.  Psychiatric: He has a normal mood and affect.  Nursing note and vitals reviewed.    ED Treatments / Results  Labs (all labs ordered are listed, but only abnormal results are displayed) Labs Reviewed  BASIC METABOLIC PANEL  CBC  URINALYSIS, ROUTINE W REFLEX MICROSCOPIC  CBG  MONITORING, ED    EKG EKG Interpretation  Date/Time:  Sunday December 13 2017 16:17:33 EDT Ventricular Rate:  80 PR Interval:    QRS Duration: 77 QT Interval:  347 QTC Calculation: 401 R Axis:   32 Text Interpretation:  Sinus rhythm Low voltage, precordial leads Abnormal R-wave progression, early transition No significant change since last tracing Confirmed by ,  ([id number]) on 12/13/2017 9:03:38 PM   Radiology No results found.  Procedures Procedures (including critical care time)  Medications Ordered in ED Medications - No data to display   Initial Impression / Assessment and Plan / ED Course  I have reviewed the triage vital signs and the nursing notes.  Pertinent labs & imaging results that were available during my care of the patient were reviewed by me and considered in my medical decision making (see chart for details).     84  year old male with dementia presents today with an episode of decreased responsiveness.  Here he is alert and oriented and appears well.  Work-up reveals urinalysis significant for urinary tract infection with greater than 50 white blood cells, moderate leukocyte esterase.  Urine is cultured and patient has received IV Rocephin.  CBC is normal with the exception of mild for bicytopenia with platelets of 142,000.  Sodium potassium chloride are normal.  Creatinine is slightly elevated but appears stable from prior at 1.39.  Troponin is normal.  Feel the episode of decreased responsiveness is most likely secondary to his urinary tract infection.  No evidence of hypotension during evaluation here.  He has been on monitor no evidence of arrhythmia has been noted.  I have discussed the work-up with the patient's wife.  She is in agreement with the plan to discharge patient back to his care facility.  I will write a prescription for levaquin.  Final Clinical Impressions(s) / ED Diagnoses   Final diagnoses:  Transient alteration of awareness  Urinary  tract infection with hematuria, site unspecified  Dementia without behavioral disturbance, unspecified dementia type    ED  Discharge Orders    None       Margarita Grizzle, MD 12/13/17 2110

## 2017-12-13 NOTE — ED Notes (Signed)
[facility]; Jasmine, Charity fundraiserN.

## 2017-12-13 NOTE — Discharge Instructions (Addendum)
Please closely monitor blood sugar Patient should have provider reevaluate in 1-2 days Return if worse at any time

## 2017-12-16 LAB — URINE CULTURE

## 2017-12-17 ENCOUNTER — Telehealth: Payer: Self-pay | Admitting: Emergency Medicine

## 2017-12-17 NOTE — Telephone Encounter (Signed)
Post ED Visit - Positive Culture Follow-up  Culture report reviewed by antimicrobial stewardship pharmacist:  []  Christian Erickson, Pharm.D. []  Christian Erickson, Pharm.D., BCPS AQ-ID []  Christian Erickson, Pharm.D., BCPS []  Christian Erickson, Pharm.D., BCPS []  Christian Erickson, [place] BCPS, AAHIVP []  Christian Erickson, Pharm.D., BCPS, AAHIVP []  Christian Erickson, PharmD, BCPS []  Christian Erickson, PharmD []  Christian Erickson, PharmD, BCPS [facility] CenterBrooke Erickson PharmD  Positive urine culture Treated with levofloxacin, organism sensitive to the same and no further patient follow-up is required at this time.  [name], [name] 12/17/2017, 4:24 PM

## 2018-08-30 ENCOUNTER — Telehealth: Payer: Self-pay | Admitting: Internal Medicine

## 2018-08-30 NOTE — Telephone Encounter (Signed)
Please advise- Pt no longer under our care- can we give information?

## 2018-08-30 NOTE — Telephone Encounter (Signed)
Copied from CRM [id number]. Topic: Quick Communication - See Telephone Encounter >> Aug 30, 2018  3:48 PM [name] A wrote: CRM for notification. See Telephone encounter for: 08/30/18.  Patient's wife is calling Christian Erickson is calling to see if can get information on when the patient was diagnosed with neuropathy and dementia. Please advise [phone] Trying get disability through the [employer]. Patient is under hospice care No longer a patient of Dr. Drue Novel.

## 2019-05-08 DEATH — deceased

## 2019-05-10 ENCOUNTER — Telehealth: Payer: Self-pay

## 2019-05-10 NOTE — Telephone Encounter (Signed)
Pt passed away May 09, 2019.
